# Patient Record
Sex: Female | Born: 1950
Health system: Southern US, Community
[De-identification: ages and names within clinical notes are randomized; demographics above are authoritative.]

## PROBLEM LIST (undated history)

## (undated) DIAGNOSIS — F329 Major depressive disorder, single episode, unspecified: Secondary | ICD-10-CM

## (undated) DIAGNOSIS — I959 Hypotension, unspecified: Secondary | ICD-10-CM

## (undated) DIAGNOSIS — R55 Syncope and collapse: Secondary | ICD-10-CM

## (undated) DIAGNOSIS — R51 Headache: Secondary | ICD-10-CM

## (undated) DIAGNOSIS — Z9889 Other specified postprocedural states: Secondary | ICD-10-CM

## (undated) DIAGNOSIS — R112 Nausea with vomiting, unspecified: Secondary | ICD-10-CM

## (undated) DIAGNOSIS — N183 Chronic kidney disease, stage 3 unspecified: Secondary | ICD-10-CM

## (undated) DIAGNOSIS — R519 Headache, unspecified: Secondary | ICD-10-CM

## (undated) DIAGNOSIS — F32A Depression, unspecified: Secondary | ICD-10-CM

## (undated) DIAGNOSIS — I4729 Other ventricular tachycardia: Secondary | ICD-10-CM

## (undated) DIAGNOSIS — I471 Supraventricular tachycardia, unspecified: Secondary | ICD-10-CM

## (undated) HISTORY — DX: Syncope and collapse: R55

## (undated) HISTORY — DX: Other ventricular tachycardia: I47.29

## (undated) HISTORY — PX: COLONOSCOPY: SHX174

## (undated) HISTORY — PX: TUBAL LIGATION: SHX77

## (undated) HISTORY — DX: Hypotension, unspecified: I95.9

## (undated) HISTORY — PX: REDUCTION MAMMAPLASTY: SUR839

## (undated) HISTORY — PX: TONSILLECTOMY: SUR1361

## (undated) HISTORY — PX: KNEE ARTHROSCOPY: SHX127

## (undated) HISTORY — PX: APPENDECTOMY: SHX54

## (undated) HISTORY — PX: KNEE ARTHROSCOPY W/ MENISCAL REPAIR: SHX1877

## (undated) HISTORY — DX: Supraventricular tachycardia, unspecified: I47.10

## (undated) HISTORY — DX: Chronic kidney disease, stage 3 unspecified: N18.30

## (undated) HISTORY — PX: BREAST EXCISIONAL BIOPSY: SUR124

---

## 1984-11-28 HISTORY — PX: LUMBAR LAMINECTOMY: SHX95

## 1998-08-12 ENCOUNTER — Other Ambulatory Visit: Admission: RE | Admit: 1998-08-12 | Discharge: 1998-08-12 | Payer: Self-pay | Admitting: Obstetrics and Gynecology

## 1998-08-28 ENCOUNTER — Ambulatory Visit (HOSPITAL_COMMUNITY): Admission: RE | Admit: 1998-08-28 | Discharge: 1998-08-28 | Payer: Self-pay | Admitting: Obstetrics and Gynecology

## 1999-09-02 ENCOUNTER — Other Ambulatory Visit: Admission: RE | Admit: 1999-09-02 | Discharge: 1999-09-02 | Payer: Self-pay | Admitting: Obstetrics and Gynecology

## 1999-10-11 ENCOUNTER — Encounter (INDEPENDENT_AMBULATORY_CARE_PROVIDER_SITE_OTHER): Payer: Self-pay | Admitting: Specialist

## 1999-10-11 ENCOUNTER — Other Ambulatory Visit: Admission: RE | Admit: 1999-10-11 | Discharge: 1999-10-11 | Payer: Self-pay | Admitting: Obstetrics and Gynecology

## 1999-10-20 ENCOUNTER — Ambulatory Visit (HOSPITAL_BASED_OUTPATIENT_CLINIC_OR_DEPARTMENT_OTHER): Admission: RE | Admit: 1999-10-20 | Discharge: 1999-10-20 | Payer: Self-pay | Admitting: *Deleted

## 1999-10-20 ENCOUNTER — Encounter (INDEPENDENT_AMBULATORY_CARE_PROVIDER_SITE_OTHER): Payer: Self-pay | Admitting: *Deleted

## 2000-01-01 ENCOUNTER — Encounter: Payer: Self-pay | Admitting: Emergency Medicine

## 2000-01-01 ENCOUNTER — Emergency Department (HOSPITAL_COMMUNITY): Admission: EM | Admit: 2000-01-01 | Discharge: 2000-01-01 | Payer: Self-pay | Admitting: Emergency Medicine

## 2000-10-06 ENCOUNTER — Other Ambulatory Visit: Admission: RE | Admit: 2000-10-06 | Discharge: 2000-10-06 | Payer: Self-pay | Admitting: Obstetrics and Gynecology

## 2001-01-25 ENCOUNTER — Encounter: Admission: RE | Admit: 2001-01-25 | Discharge: 2001-01-25 | Payer: Self-pay | Admitting: Occupational Medicine

## 2001-01-25 ENCOUNTER — Encounter: Payer: Self-pay | Admitting: Occupational Medicine

## 2001-10-10 ENCOUNTER — Other Ambulatory Visit: Admission: RE | Admit: 2001-10-10 | Discharge: 2001-10-10 | Payer: Self-pay | Admitting: Obstetrics and Gynecology

## 2002-05-10 ENCOUNTER — Encounter
Admission: RE | Admit: 2002-05-10 | Discharge: 2002-06-03 | Payer: Self-pay | Admitting: Physical Medicine & Rehabilitation

## 2002-08-28 ENCOUNTER — Encounter: Payer: Self-pay | Admitting: Orthopedic Surgery

## 2002-08-28 ENCOUNTER — Ambulatory Visit (HOSPITAL_COMMUNITY): Admission: RE | Admit: 2002-08-28 | Discharge: 2002-08-28 | Payer: Self-pay | Admitting: Orthopedic Surgery

## 2002-10-09 ENCOUNTER — Other Ambulatory Visit: Admission: RE | Admit: 2002-10-09 | Discharge: 2002-10-09 | Payer: Self-pay | Admitting: Obstetrics and Gynecology

## 2002-10-23 ENCOUNTER — Encounter: Admission: RE | Admit: 2002-10-23 | Discharge: 2002-10-23 | Payer: Self-pay | Admitting: Obstetrics and Gynecology

## 2002-10-23 ENCOUNTER — Encounter: Payer: Self-pay | Admitting: Obstetrics and Gynecology

## 2002-12-13 ENCOUNTER — Ambulatory Visit (HOSPITAL_COMMUNITY): Admission: RE | Admit: 2002-12-13 | Discharge: 2002-12-13 | Payer: Self-pay | Admitting: Gastroenterology

## 2003-10-16 ENCOUNTER — Encounter: Admission: RE | Admit: 2003-10-16 | Discharge: 2003-10-16 | Payer: Self-pay | Admitting: Obstetrics and Gynecology

## 2004-09-10 ENCOUNTER — Encounter: Payer: Self-pay | Admitting: General Practice

## 2004-10-18 ENCOUNTER — Encounter: Admission: RE | Admit: 2004-10-18 | Discharge: 2004-10-18 | Payer: Self-pay | Admitting: Obstetrics and Gynecology

## 2004-11-10 ENCOUNTER — Other Ambulatory Visit: Admission: RE | Admit: 2004-11-10 | Discharge: 2004-11-10 | Payer: Self-pay | Admitting: Obstetrics and Gynecology

## 2005-11-10 ENCOUNTER — Other Ambulatory Visit: Admission: RE | Admit: 2005-11-10 | Discharge: 2005-11-10 | Payer: Self-pay | Admitting: Obstetrics and Gynecology

## 2005-11-10 ENCOUNTER — Encounter: Admission: RE | Admit: 2005-11-10 | Discharge: 2005-11-10 | Payer: Self-pay | Admitting: Obstetrics and Gynecology

## 2006-11-14 ENCOUNTER — Encounter: Admission: RE | Admit: 2006-11-14 | Discharge: 2006-11-14 | Payer: Self-pay | Admitting: Obstetrics and Gynecology

## 2006-11-14 ENCOUNTER — Other Ambulatory Visit: Admission: RE | Admit: 2006-11-14 | Discharge: 2006-11-14 | Payer: Self-pay | Admitting: Obstetrics and Gynecology

## 2007-11-28 ENCOUNTER — Encounter: Admission: RE | Admit: 2007-11-28 | Discharge: 2007-11-28 | Payer: Self-pay | Admitting: Obstetrics and Gynecology

## 2007-12-05 ENCOUNTER — Other Ambulatory Visit: Admission: RE | Admit: 2007-12-05 | Discharge: 2007-12-05 | Payer: Self-pay | Admitting: Obstetrics and Gynecology

## 2008-07-11 ENCOUNTER — Encounter: Admission: RE | Admit: 2008-07-11 | Discharge: 2008-07-11 | Payer: Self-pay | Admitting: Obstetrics and Gynecology

## 2008-12-08 ENCOUNTER — Encounter: Admission: RE | Admit: 2008-12-08 | Discharge: 2008-12-08 | Payer: Self-pay | Admitting: Obstetrics and Gynecology

## 2008-12-08 ENCOUNTER — Other Ambulatory Visit: Admission: RE | Admit: 2008-12-08 | Discharge: 2008-12-08 | Payer: Self-pay | Admitting: Obstetrics and Gynecology

## 2009-12-09 ENCOUNTER — Encounter: Admission: RE | Admit: 2009-12-09 | Discharge: 2009-12-09 | Payer: Self-pay | Admitting: Obstetrics and Gynecology

## 2009-12-10 ENCOUNTER — Other Ambulatory Visit: Admission: RE | Admit: 2009-12-10 | Discharge: 2009-12-10 | Payer: Self-pay | Admitting: Obstetrics and Gynecology

## 2010-11-28 HISTORY — PX: ROTATOR CUFF REPAIR: SHX139

## 2010-12-14 ENCOUNTER — Other Ambulatory Visit: Payer: Self-pay | Admitting: Dermatology

## 2010-12-14 ENCOUNTER — Encounter
Admission: RE | Admit: 2010-12-14 | Discharge: 2010-12-14 | Payer: Self-pay | Source: Home / Self Care | Attending: Obstetrics and Gynecology | Admitting: Obstetrics and Gynecology

## 2010-12-14 ENCOUNTER — Other Ambulatory Visit
Admission: RE | Admit: 2010-12-14 | Discharge: 2010-12-14 | Payer: Self-pay | Source: Home / Self Care | Admitting: Obstetrics and Gynecology

## 2010-12-14 ENCOUNTER — Other Ambulatory Visit: Payer: Self-pay | Admitting: Obstetrics and Gynecology

## 2011-04-15 NOTE — Op Note (Signed)
   NAME:  Mary Freeman, Mary Freeman                          ACCOUNT NO.:  [id number]   MEDICAL RECORD NO.:  [id number]                   PATIENT TYPE:  AMB   LOCATION:  ENDO                                 FACILITY:  [facility]   PHYSICIAN:  Anselmo Rod, M.D.               DATE OF BIRTH:  09/15/1951   DATE OF PROCEDURE:  12/13/2002  DATE OF DISCHARGE:                                 OPERATIVE REPORT   PROCEDURE PERFORMED:  Screening colonoscopy.   ENDOSCOPIST:  Anselmo Rod, M.D.   INSTRUMENT USED:  Pediatric adjustable Olympus colonoscope.   INDICATIONS FOR PROCEDURE:  A 60 year old white female undergoing screening  colonoscopy.  She was found to have guaiac-positive stools on a recent exam.  Rule out colonic polyps, masses, etc.   PREPROCEDURE PREPARATION:  Informed consent was procured from the patient.  The patient had fasted for eight hours prior to the procedure and prepped  with a bottle of Gatorade and MiraLax the night prior to the procedure.   PREPROCEDURE PHYSICAL:  VITAL SIGNS:  The patient has stable vital signs.  NECK:  Supple.  CHEST:  Clear to auscultation, S1, S2 regular.  ABDOMEN:  Soft with normal bowel sounds.   DESCRIPTION OF PROCEDURE:  The patient was placed in the left lateral  decubitus position and sedated with 100 mg of Demerol and 10 mg of Versed  intravenously.  Once the patient was adequately sedated and maintained on  low-flow oxygen and continuous cardiac monitoring, the Olympus video  colonoscope was advanced from the rectum to the cecum and terminal ileum.  There was some residual stool in the colon.  Multiple washes were done.  No  masses, polyps, erosions, ulcerations, diverticula or hemorrhoids were seen.  The patient tolerated the procedure well without complications.   IMPRESSION:  Normal colonoscopy up to terminal ileum.   RECOMMENDATIONS:  1. Repeat guaiac testing will be done on an outpatient basis to document     guaiac-negative stools.   If stools continue to be guaiac positive,     further work-up will be undertaken as needed.   1.     Repeat colorectal cancer screening has been recommended in the next five     years unless the patient develops any abnormal symptoms in the interim.   1. Outpatient follow up in the next two weeks or earlier if needed.                                                 Anselmo Rod, M.D.    JNM/MEDQ  D:  12/13/2002  T:  12/13/2002  Job:  [id number]   cc:   Chales Salmon. Abigail Miyamoto, M.D.  [address]  [place]  [place] [address]  Fax: [phone]

## 2011-05-19 ENCOUNTER — Other Ambulatory Visit: Payer: Self-pay | Admitting: Gastroenterology

## 2011-05-19 ENCOUNTER — Ambulatory Visit (HOSPITAL_COMMUNITY)
Admission: RE | Admit: 2011-05-19 | Discharge: 2011-05-19 | Disposition: A | Discharge status: Home / Self Care | Payer: PRIVATE HEALTH INSURANCE | Source: Ambulatory Visit | Attending: Gastroenterology | Admitting: Gastroenterology

## 2011-05-19 DIAGNOSIS — R197 Diarrhea, unspecified: Secondary | ICD-10-CM | POA: Insufficient documentation

## 2011-06-09 NOTE — Op Note (Signed)
  NAMEKATERYN, Freeman NO.:  [id number]  MEDICAL RECORD NO.:  [id number]  LOCATION:  [facility]LEN                         FACILITY:  [facility]  PHYSICIAN:  Danise Edge, M.D.   DATE OF BIRTH:  [birth year]  DATE OF PROCEDURE:  05/19/2011 DATE OF DISCHARGE:                              OPERATIVE REPORT   REFERRING PHYSICIAN:  Dr. Sigmund Hazel.  HISTORY:  Mary Freeman is a 60 year old female born Nov 14, 1951. The patient has unexplained painless, nonbloody diarrhea.  The patient has had diarrhea since January 2012.  She will have up to 20 loose, nonbloody bowel movements daily, unassociated with abdominal pain, anorexia, fever, or weight loss.  She rarely has nocturnal diarrhea.  There is no family history of inflammatory bowel disease. There is no past history of liver disease or pancreatic disease.  The patient stopped taking her multivitamins which did not improve her diarrhea.  She stopped consuming very products and her diarrhea did not improve.  In 2009, the patient underwent a normal colonoscopy.  ENDOSCOPIST:  Danise Edge, M.D.  PREMEDICATION:  Propofol administered by Anesthesia.  PROCEDURE:  Anal inspection and digital rectal exam were normal.  The Pentax pediatric colonoscope was introduced into the rectum and advanced to the cecum.  A normal-appearing ileocecal valve and appendiceal orifice were identified.  I was unable to intubate the ileocecal valve and enter the ileum.  Colonic preparation for the exam today was good.  Rectum normal.  Retroflex view of the distal rectum normal.  Sigmoid colon and descending colon normal.  Splenic flexure normal.  Transverse colon normal.  Hepatic flexure normal.  Ascending colon normal.  Cecum and ileocecal valve normal.  BIOPSIES:  Random colon biopsies were taken from the right colon and left colon to look for microscopic colitis.  ASSESSMENT:  Normal proctocolonoscopy to the cecum.  Random  colon biopsies to screen for microscopic colitis pending.          ______________________________ Danise Edge, M.D.     MJ/MEDQ  D:  05/19/2011  T:  05/19/2011  Job:  [id number]  cc:   Sigmund Hazel, M.D. Fax: [phone]  Electronically Signed by Danise Edge M.D. on 06/09/2011 03:55:50 PM

## 2011-09-19 DIAGNOSIS — M67919 Unspecified disorder of synovium and tendon, unspecified shoulder: Secondary | ICD-10-CM

## 2011-09-19 DIAGNOSIS — M7700 Medial epicondylitis, unspecified elbow: Secondary | ICD-10-CM

## 2011-09-19 HISTORY — DX: Unspecified disorder of synovium and tendon, unspecified shoulder: M67.919

## 2011-09-19 HISTORY — DX: Medial epicondylitis, unspecified elbow: M77.00

## 2011-12-09 ENCOUNTER — Other Ambulatory Visit: Payer: Self-pay | Admitting: Obstetrics and Gynecology

## 2011-12-09 DIAGNOSIS — Z1231 Encounter for screening mammogram for malignant neoplasm of breast: Secondary | ICD-10-CM

## 2011-12-15 ENCOUNTER — Other Ambulatory Visit: Payer: Self-pay | Admitting: Dermatology

## 2011-12-15 ENCOUNTER — Other Ambulatory Visit (HOSPITAL_COMMUNITY)
Admission: RE | Admit: 2011-12-15 | Discharge: 2011-12-15 | Disposition: A | Discharge status: Home / Self Care | Payer: PRIVATE HEALTH INSURANCE | Source: Ambulatory Visit | Attending: Obstetrics and Gynecology | Admitting: Obstetrics and Gynecology

## 2011-12-15 ENCOUNTER — Other Ambulatory Visit: Payer: Self-pay | Admitting: Nurse Practitioner

## 2011-12-15 DIAGNOSIS — Z01419 Encounter for gynecological examination (general) (routine) without abnormal findings: Secondary | ICD-10-CM | POA: Insufficient documentation

## 2011-12-28 DIAGNOSIS — M7580 Other shoulder lesions, unspecified shoulder: Secondary | ICD-10-CM

## 2011-12-28 DIAGNOSIS — M75100 Unspecified rotator cuff tear or rupture of unspecified shoulder, not specified as traumatic: Secondary | ICD-10-CM

## 2011-12-28 HISTORY — DX: Other shoulder lesions, unspecified shoulder: M75.80

## 2011-12-28 HISTORY — DX: Unspecified rotator cuff tear or rupture of unspecified shoulder, not specified as traumatic: M75.100

## 2012-02-02 ENCOUNTER — Other Ambulatory Visit: Payer: Self-pay | Admitting: Family Medicine

## 2012-02-02 ENCOUNTER — Ambulatory Visit
Admission: RE | Admit: 2012-02-02 | Discharge: 2012-02-02 | Disposition: A | Discharge status: Home / Self Care | Payer: PRIVATE HEALTH INSURANCE | Source: Ambulatory Visit | Attending: Obstetrics and Gynecology | Admitting: Obstetrics and Gynecology

## 2012-02-02 DIAGNOSIS — Z1231 Encounter for screening mammogram for malignant neoplasm of breast: Secondary | ICD-10-CM

## 2012-03-09 ENCOUNTER — Other Ambulatory Visit: Payer: Self-pay | Admitting: Family Medicine

## 2012-04-16 DIAGNOSIS — Z9889 Other specified postprocedural states: Secondary | ICD-10-CM

## 2012-04-16 HISTORY — DX: Other specified postprocedural states: Z98.890

## 2012-06-11 DIAGNOSIS — M2240 Chondromalacia patellae, unspecified knee: Secondary | ICD-10-CM

## 2012-06-11 HISTORY — DX: Chondromalacia patellae, unspecified knee: M22.40

## 2012-12-06 ENCOUNTER — Other Ambulatory Visit: Payer: Self-pay | Admitting: Dermatology

## 2013-01-07 ENCOUNTER — Other Ambulatory Visit: Payer: Self-pay | Admitting: Family Medicine

## 2013-01-07 DIAGNOSIS — Z803 Family history of malignant neoplasm of breast: Secondary | ICD-10-CM

## 2013-01-07 DIAGNOSIS — Z1231 Encounter for screening mammogram for malignant neoplasm of breast: Secondary | ICD-10-CM

## 2013-02-04 ENCOUNTER — Ambulatory Visit: Payer: PRIVATE HEALTH INSURANCE

## 2013-02-20 ENCOUNTER — Ambulatory Visit
Admission: RE | Admit: 2013-02-20 | Discharge: 2013-02-20 | Disposition: A | Discharge status: Home / Self Care | Payer: PRIVATE HEALTH INSURANCE | Source: Ambulatory Visit | Attending: Family Medicine | Admitting: Family Medicine

## 2013-02-20 DIAGNOSIS — Z803 Family history of malignant neoplasm of breast: Secondary | ICD-10-CM

## 2013-02-20 DIAGNOSIS — Z1231 Encounter for screening mammogram for malignant neoplasm of breast: Secondary | ICD-10-CM

## 2014-02-03 ENCOUNTER — Other Ambulatory Visit: Payer: Self-pay

## 2014-02-03 DIAGNOSIS — Z803 Family history of malignant neoplasm of breast: Secondary | ICD-10-CM

## 2014-02-03 DIAGNOSIS — Z1231 Encounter for screening mammogram for malignant neoplasm of breast: Secondary | ICD-10-CM

## 2014-03-12 ENCOUNTER — Ambulatory Visit
Admission: RE | Admit: 2014-03-12 | Discharge: 2014-03-12 | Disposition: A | Discharge status: Home / Self Care | Payer: BC Managed Care – PPO | Source: Ambulatory Visit

## 2014-03-12 DIAGNOSIS — Z803 Family history of malignant neoplasm of breast: Secondary | ICD-10-CM

## 2014-03-12 DIAGNOSIS — Z1231 Encounter for screening mammogram for malignant neoplasm of breast: Secondary | ICD-10-CM

## 2014-03-19 ENCOUNTER — Other Ambulatory Visit: Payer: Self-pay | Admitting: Family Medicine

## 2014-03-19 DIAGNOSIS — R928 Other abnormal and inconclusive findings on diagnostic imaging of breast: Secondary | ICD-10-CM

## 2014-03-24 ENCOUNTER — Ambulatory Visit
Admission: RE | Admit: 2014-03-24 | Discharge: 2014-03-24 | Disposition: A | Discharge status: Home / Self Care | Payer: BC Managed Care – PPO | Source: Ambulatory Visit | Attending: Family Medicine | Admitting: Family Medicine

## 2014-03-24 ENCOUNTER — Other Ambulatory Visit: Payer: Self-pay | Admitting: Dermatology

## 2014-03-24 DIAGNOSIS — R928 Other abnormal and inconclusive findings on diagnostic imaging of breast: Secondary | ICD-10-CM

## 2014-03-26 ENCOUNTER — Ambulatory Visit: Payer: PRIVATE HEALTH INSURANCE

## 2014-03-27 ENCOUNTER — Other Ambulatory Visit: Payer: BC Managed Care – PPO

## 2014-08-18 ENCOUNTER — Other Ambulatory Visit: Payer: Self-pay | Admitting: Dermatology

## 2015-07-23 ENCOUNTER — Other Ambulatory Visit: Payer: Self-pay

## 2015-07-23 DIAGNOSIS — Z1231 Encounter for screening mammogram for malignant neoplasm of breast: Secondary | ICD-10-CM

## 2015-08-10 ENCOUNTER — Ambulatory Visit
Admission: RE | Admit: 2015-08-10 | Discharge: 2015-08-10 | Disposition: A | Discharge status: Home / Self Care | Payer: 59 | Source: Ambulatory Visit

## 2015-08-10 DIAGNOSIS — Z1231 Encounter for screening mammogram for malignant neoplasm of breast: Secondary | ICD-10-CM

## 2016-07-29 ENCOUNTER — Other Ambulatory Visit: Payer: Self-pay | Admitting: Family Medicine

## 2016-07-29 DIAGNOSIS — Z803 Family history of malignant neoplasm of breast: Secondary | ICD-10-CM

## 2016-07-29 DIAGNOSIS — Z1231 Encounter for screening mammogram for malignant neoplasm of breast: Secondary | ICD-10-CM

## 2016-12-19 DIAGNOSIS — M9903 Segmental and somatic dysfunction of lumbar region: Secondary | ICD-10-CM | POA: Diagnosis not present

## 2016-12-19 DIAGNOSIS — M9901 Segmental and somatic dysfunction of cervical region: Secondary | ICD-10-CM | POA: Diagnosis not present

## 2016-12-19 DIAGNOSIS — M9902 Segmental and somatic dysfunction of thoracic region: Secondary | ICD-10-CM | POA: Diagnosis not present

## 2016-12-19 DIAGNOSIS — M531 Cervicobrachial syndrome: Secondary | ICD-10-CM | POA: Diagnosis not present

## 2016-12-19 DIAGNOSIS — M9904 Segmental and somatic dysfunction of sacral region: Secondary | ICD-10-CM | POA: Diagnosis not present

## 2016-12-19 DIAGNOSIS — M6080 Other myositis, unspecified site: Secondary | ICD-10-CM | POA: Diagnosis not present

## 2017-01-03 DIAGNOSIS — G43009 Migraine without aura, not intractable, without status migrainosus: Secondary | ICD-10-CM | POA: Diagnosis not present

## 2017-01-03 DIAGNOSIS — N183 Chronic kidney disease, stage 3 (moderate): Secondary | ICD-10-CM | POA: Diagnosis not present

## 2017-01-03 DIAGNOSIS — F329 Major depressive disorder, single episode, unspecified: Secondary | ICD-10-CM | POA: Diagnosis not present

## 2017-01-03 DIAGNOSIS — G479 Sleep disorder, unspecified: Secondary | ICD-10-CM | POA: Diagnosis not present

## 2017-01-25 DIAGNOSIS — S46812A Strain of other muscles, fascia and tendons at shoulder and upper arm level, left arm, initial encounter: Secondary | ICD-10-CM | POA: Diagnosis not present

## 2017-01-31 DIAGNOSIS — S46812A Strain of other muscles, fascia and tendons at shoulder and upper arm level, left arm, initial encounter: Secondary | ICD-10-CM | POA: Diagnosis not present

## 2017-02-01 DIAGNOSIS — S97121A Crushing injury of right lesser toe(s), initial encounter: Secondary | ICD-10-CM | POA: Diagnosis not present

## 2017-02-01 DIAGNOSIS — Z23 Encounter for immunization: Secondary | ICD-10-CM | POA: Diagnosis not present

## 2017-02-06 DIAGNOSIS — S97121D Crushing injury of right lesser toe(s), subsequent encounter: Secondary | ICD-10-CM | POA: Diagnosis not present

## 2017-02-08 DIAGNOSIS — S97121D Crushing injury of right lesser toe(s), subsequent encounter: Secondary | ICD-10-CM | POA: Diagnosis not present

## 2017-02-13 DIAGNOSIS — S97121D Crushing injury of right lesser toe(s), subsequent encounter: Secondary | ICD-10-CM | POA: Diagnosis not present

## 2017-02-13 DIAGNOSIS — M7542 Impingement syndrome of left shoulder: Secondary | ICD-10-CM | POA: Diagnosis not present

## 2017-02-13 DIAGNOSIS — S46812D Strain of other muscles, fascia and tendons at shoulder and upper arm level, left arm, subsequent encounter: Secondary | ICD-10-CM | POA: Diagnosis not present

## 2017-02-15 ENCOUNTER — Encounter: Payer: Self-pay | Admitting: Rehabilitative and Restorative Service Providers"

## 2017-02-15 ENCOUNTER — Ambulatory Visit (INDEPENDENT_AMBULATORY_CARE_PROVIDER_SITE_OTHER): Payer: 59 | Admitting: Rehabilitative and Restorative Service Providers"

## 2017-02-15 DIAGNOSIS — M25512 Pain in left shoulder: Secondary | ICD-10-CM

## 2017-02-15 DIAGNOSIS — S91214D Laceration without foreign body of right lesser toe(s) with damage to nail, subsequent encounter: Secondary | ICD-10-CM | POA: Diagnosis not present

## 2017-02-15 DIAGNOSIS — R29898 Other symptoms and signs involving the musculoskeletal system: Secondary | ICD-10-CM | POA: Diagnosis not present

## 2017-02-15 DIAGNOSIS — S99921D Unspecified injury of right foot, subsequent encounter: Secondary | ICD-10-CM | POA: Diagnosis not present

## 2017-02-15 NOTE — Patient Instructions (Addendum)
Self massage using about a 4 inch plastic ball  Shoulder Blade Squeeze   Can use swim noodle along back  Rotate shoulders back, then squeeze shoulder blades down and back. Hold 10 sec Repeat __10__ times. Do __several __ sessions per day.   Axial Extension (Chin Tuck)    Pull chin in and lengthen back of neck. Hold _10-15___ seconds while counting out loud. Repeat _5-10___ times. Do _several___ sessions per day.  Pulley  Arms straight up  - 10 sec hold x 10 reps  Arms out like a jumping jack  -  10 sec hold x 10 reps   SUPINE Tips A    Being in the supine position means to be lying on the back. Lying on the back is the position of least compression on the bones and discs of the spine, and helps to re-align the natural curves of the back.gradually bring arms up to the side toward 120 degrees. Lying on back for 3-5 minutes - can bend elbows to release stretch a few seconds as needed then straighten arms out again.     TENS UNIT: This is helpful for muscle pain and spasm.   Search and Purchase a TENS 7000 2nd edition at www.tenspros.com. It should be less than $30.     TENS unit instructions: Do not shower or bathe with the unit on Turn the unit off before removing electrodes or batteries If the electrodes lose stickiness add a drop of water to the electrodes after they are disconnected from the unit and place on plastic sheet. If you continued to have difficulty, call the TENS unit company to purchase more electrodes. Do not apply lotion on the skin area prior to use. Make sure the skin is clean and dry as this will help prolong the life of the electrodes. After use, always check skin for unusual red areas, rash or other skin difficulties. If there are any skin problems, does not apply electrodes to the same area. Never remove the electrodes from the unit by pulling the wires. Do not use the TENS unit or electrodes other than as directed. Do not change electrode placement  without consultating your therapist or physician. Keep 2 fingers with between each electrode.

## 2017-02-15 NOTE — Therapy (Addendum)
[name], [place], [address] Phone: [phone]   Fax:  [phone]  Physical Therapy Evaluation  Patient Details  Name: Mary Freeman MRN: [id number] Date of Birth: November 11, 1951 Referring Provider: Dr Justice Britain  Encounter Date: 02/15/2017      PT End of Session - 02/15/17 1424    Visit Number 1   Number of Visits 12   Date for PT Re-Evaluation 03/29/17   PT Start Time 1430   PT Stop Time 1529   PT Time Calculation (min) 59 min   Activity Tolerance Patient tolerated treatment well      History reviewed. No pertinent past medical history.  History reviewed. No pertinent surgical history.  There were no vitals filed for this visit.       Subjective Assessment - 02/15/17 1440    Subjective Mary Freeman reports that she injured Lt shoudler 10/25/16 while working outside she fell onto [employer] after slipping in wet leaves. She felt pain. She was seen by a chiropractor for 3 visits which helped the neck symptoms but not the neck. She was seen by ortho and received injection in the Lt shoudler 02/13/17. She now has pain in the Lt elbow and continues to have discomfort in the Lt shoudler.    Pertinent History Rt RCR 2013; # Rt knee srthroscopic surgeries and 1 Lt knee; lumbar laminectomy 1986   How long can you sit comfortably? no limit   How long can you stand comfortably? no limit   How long can you walk comfortably? no limit   Diagnostic tests MRI - showed fraying on the Lt RC but no tear; bone spur    Patient Stated Goals get rid of the pain and avoid surgery    Currently in Pain? Yes   Pain Score 3    Pain Location Shoulder   Pain Orientation Left   Pain Descriptors / Indicators Dull;Aching   Pain Type Acute pain   Pain Radiating Towards radiating into the Lt elbow - at ties into the Lt UE varying in the location    Pain Onset More than a month ago   Pain Frequency Intermittent   Aggravating  Factors  lying on the Rt side; lifting; carrying; work activities    Pain Relieving Factors heat; meds             [facility] PT Assessment - 02/15/17 0001      Assessment   Medical Diagnosis Impingement Lt shoudler; Lt shoudler strain   Referring Provider Dr Justice Britain   Onset Date/Surgical Date 10/25/16   Hand Dominance Right   Next MD Visit 03/16/17   Prior Therapy chiropractic care 3 visits      Precautions   Precautions None     Balance Screen   Has the patient fallen in the past 6 months Yes   How many times? 1   Has the patient had a decrease in activity level because of a fear of falling?  No   Is the patient reluctant to leave their home because of a fear of falling?  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    Leisure household chores; yard work; walking in the  neighborhood - 4 days/wk 45 min (not walking due to toe injury Lt foot)      Observation/Other Assessments   Focus on Therapeutic Outcomes (FOTO)  57% limitation      Sensation   Additional Comments WFL's per pt report - occassional numb tingling into the hand if lying on sofa      Posture/Postural Control   Posture Comments head forward; shoudlers rounded and elevated; increased thoracic kyphosis; scapulae abducted and rotated along the thoracic spine      AROM   Right Shoulder Extension 59 Degrees   Right Shoulder Flexion 153 Degrees   Right Shoulder ABduction 157 Degrees   Right Shoulder Internal Rotation 38 Degrees   Right Shoulder External Rotation 90 Degrees   Left Shoulder Extension 37 Degrees   Left Shoulder Flexion 77 Degrees   Left Shoulder ABduction 73 Degrees   Left Shoulder Internal Rotation 27 Degrees   Left Shoulder External Rotation 65 Degrees   Cervical  Flexion 46   Cervical Extension 43   Cervical - Right Side Bend 30   Cervical - Left Side Bend 26   Cervical - Right Rotation 64   Cervical - Left Rotation 60     Strength   Strength Assessment Site --   Right/Left Shoulder --  Lt shoudler - WNL's    Right Shoulder Flexion 3-/5  pain    Right Shoulder Extension 4+/5   Right Shoulder ABduction 3-/5  pain    Right Shoulder Internal Rotation 4/5  pain    Right Shoulder External Rotation 4/5     Palpation   Palpation comment tenderness and tightness through pecs; anterior shoudler; upper traps; leveator Rt shoulder girdle                    [facility] Adult PT Treatment/Exercise - 02/15/17 0001      Therapeutic Activites    Therapeutic Activities --  myofacail ball release work      Neuro Re-ed    Neuro Re-ed Details  working on posture and alignment engaging posterior shoudler girdle     Shoulder Exercises: Supine   Other Supine Exercises sustained snow angle stretch 2-3 min arms ~ 70 deg abduction resting on table     Shoulder Exercises: Standing   Other Standing Exercises scap squeeze with noodle 10 sec x 10; axial extension 5-10 sec x 5      Shoulder Exercises: Pulleys   Flexion --  10 sec hold x 10    ABduction --  10 sec hold x 10    ABduction Limitations scaption      Moist Heat Therapy   Number Minutes Moist Heat 20 Minutes   Moist Heat Location Shoulder;Elbow  Lt     Electrical Stimulation   Electrical Stimulation Location Lt shoudler    Electrical Stimulation Action IFC   Electrical Stimulation Parameters to tolerance   Electrical Stimulation Goals Pain;Tone                PT Education - 02/15/17 1510    Education provided Yes   Education Details HEP;  postural correction; ball release work; Games developer) Educated Patient   Methods Explanation;Demonstration;Tactile cues;Verbal cues;Handout   Comprehension Verbalized understanding;Returned demonstration;Verbal cues  required;Tactile cues required             PT Long Term Goals - 02/15/17 1600      PT LONG TERM GOAL #1   Title Increase AROM Lt shoudler to  equal or greater than AROM Lt shoudler 03/29/17   Time 6   Period Weeks   Status New     PT LONG TERM GOAL #2   Title 4+/5 to 5/5 strength Lt shoudler 03/29/17   Time 6   Period Weeks   Status New     PT LONG TERM GOAL #3   Title Decrease pain to no more than 1-2/10 for all functional and work tasks 03/29/17   Time 6   Period Weeks     PT LONG TERM GOAL #4   Title Independent in HEP 03/29/17   Time 6   Period Weeks   Status New     PT LONG TERM GOAL #5   Title Improve FOTO to </= 38% limitation 03/29/17   Time 6   Period Weeks   Status New               Plan - 02/15/17 1659    Clinical Impression Statement Panzy presents for minimally complex evaluation of Lt shoudler pain and dysfunction following injury to Lt shoudler due to a fall 11/17. She has diagnosis of impingement and strain and received an injection in Lt shoulder 02/13/17 with minimal improvement to date. Pt has poor posture and aignment; poor engagement of posterior shoudler girdle musculature/upper core; decreased ROM Lt shoulder and cervical spine; decreased strength Lt UE; pain and tenderness to palpation through Lt upper quarter with tightness noted through the pecs, upper traps, leveator, teres, biceps, deltoid musculature. Functional activities are limited and patient has pain on a daily basis. She will benefit from PT to address problems identifiied.    Rehab Potential Good   PT Frequency 2x / week   PT Duration 6 weeks   PT Treatment/Interventions Patient/family education;ADLs/Self Care Home Management;Cryotherapy;Electrical Stimulation;Iontophoresis 4mg /ml Dexamethasone;Moist Heat;Ultrasound;Dry needling;Manual techniques;Therapeutic activities;Therapeutic exercise;Neuromuscular re-education   PT Next Visit Plan manual work through [employer] girdle; PROM;  stretching; posterior shoudler girdle strengthening as tolerated; modalities as indicated    Consulted and Agree with Plan of Care Patient      Patient will benefit from skilled therapeutic intervention in order to improve the following deficits and impairments:  Postural dysfunction, Improper body mechanics, Pain, Decreased range of motion, Decreased mobility, Decreased strength, Impaired UE functional use, Decreased activity tolerance  Visit Diagnosis: Acute pain of left shoulder - Plan: PT plan of care cert/re-cert  Other symptoms and signs involving the musculoskeletal system - Plan: PT plan of care cert/re-cert     Problem List There are no active problems to display for this patient.    Nilda Simmer PT, MPH  02/15/2017, 5:14 PM  [facility] [place], [place], [address] Phone: [phone]   Fax:  [phone]  Name: JACQUITA MULHEARN MRN: [id number] Date of Birth: Dec 19, 1950

## 2017-02-17 ENCOUNTER — Encounter: Payer: Self-pay | Admitting: Rehabilitative and Restorative Service Providers"

## 2017-02-17 ENCOUNTER — Ambulatory Visit (INDEPENDENT_AMBULATORY_CARE_PROVIDER_SITE_OTHER): Payer: 59 | Admitting: Rehabilitative and Restorative Service Providers"

## 2017-02-17 DIAGNOSIS — R29898 Other symptoms and signs involving the musculoskeletal system: Secondary | ICD-10-CM

## 2017-02-17 DIAGNOSIS — M25512 Pain in left shoulder: Secondary | ICD-10-CM | POA: Diagnosis not present

## 2017-02-17 NOTE — Therapy (Signed)
[place], [place], [address] Phone: [phone]   Fax:  [phone]  Physical Therapy Treatment  Patient Details  Name: Mary Freeman MRN: [id number] Date of Birth: [birth year] Referring Provider: Dr Justice Britain  Encounter Date: 02/17/2017      PT End of Session - 02/17/17 1144    Visit Number 2   Number of Visits 12   Date for PT Re-Evaluation 03/29/17   PT Start Time 7341   PT Stop Time 9379   PT Time Calculation (min) 58 min   Activity Tolerance Patient tolerated treatment well      History reviewed. No pertinent past medical history.  History reviewed. No pertinent surgical history.  There were no vitals filed for this visit.      Subjective Assessment - 02/17/17 1144    Subjective Mary Freeman reports that her shoulder is feeling some better. She has been doing her exercises at home. She had some pain last night but the pain is improved. Still can't lie on the Lt side - which is the biggest thing that bothers. Pleased with her porgress.    Currently in Pain? Yes   Pain Score 1    Pain Location Shoulder   Pain Orientation Left   Pain Descriptors / Indicators Nagging;Aching   Pain Type Acute pain   Pain Onset More than a month ago   Pain Frequency Intermittent                         [facility] Adult PT Treatment/Exercise - 02/17/17 0001      Therapeutic Activites    Therapeutic Activities --  myofacail ball release work      Neuro Re-ed    Neuro Re-ed Details  working on posture and alignment engaging posterior shoudler girdle     Shoulder Exercises: Supine   Other Supine Exercises sustained snow angle stretch 2-3 min arms ~ 90 deg abduction resting on table     Shoulder Exercises: Standing   Extension Strengthening;Both;20 reps;Theraband   Theraband Level (Shoulder Extension) Level 1 (Yellow)   Row Strengthening;Both;20 reps;Theraband   Theraband Level (Shoulder  Row) Level 1 (Yellow)   Retraction AROM;Both;10 reps  pain with ful range - limited range/no resistance    Other Standing Exercises scap squeeze with noodle 10 sec x 10; axial extension 5-10 sec x 5      Shoulder Exercises: Pulleys   Flexion --  10 sec hold x 10    ABduction --  10 sec hold x 10    ABduction Limitations scaption      Shoulder Exercises: Stretch   Other Shoulder Stretches 3 way doorway stretch 30 sec x 2 each      Moist Heat Therapy   Number Minutes Moist Heat 20 Minutes   Moist Heat Location Shoulder;Elbow  Lt     Electrical Stimulation   Electrical Stimulation Location Lt shoudler    Electrical Stimulation Action IFC   Electrical Stimulation Parameters to tolerance   Electrical Stimulation Goals Pain;Tone     Manual Therapy   Manual therapy comments pt supine    Joint Mobilization Lt GH joint circumduction/ inferior glides    Soft tissue mobilization Lt shoudler girdle focus on pecs/traps/teres/biceps    Myofascial Release anterior Lt shoudler    Scapular Mobilization Lt   Passive ROM Lt shoulder  PT Education - 02/17/17 1207    Education provided Yes   Education Details HEP    Person(s) Educated Patient   Methods Explanation;Demonstration;Tactile cues;Verbal cues;Handout   Comprehension Verbalized understanding;Returned demonstration;Verbal cues required;Tactile cues required             PT Long Term Goals - 02/15/17 1600      PT LONG TERM GOAL #1   Title Increase AROM Lt shoudler to equal or greater than AROM Lt shoudler 03/29/17   Time 6   Period Weeks   Status New     PT LONG TERM GOAL #2   Title 4+/5 to 5/5 strength Lt shoudler 03/29/17   Time 6   Period Weeks   Status New     PT LONG TERM GOAL #3   Title Decrease pain to no more than 1-2/10 for all functional and work tasks 03/29/17   Time 6   Period Weeks     PT LONG TERM GOAL #4   Title Independent in HEP 03/29/17   Time 6   Period Weeks   Status New      PT LONG TERM GOAL #5   Title Improve FOTO to </= 38% limitation 03/29/17   Time 6   Period Weeks   Status New               Plan - 02/17/17 1244    Clinical Impression Statement Excellent response to initial treatment and HEP. Patient reports good improvement with pain. She has improved thoracic extension and engagement of posterior shoulder girdle musculature as well as incresed Lt shoudler ROM with pulley and PROM. Progressing well toward stated goals of therapy.    Rehab Potential Good   PT Frequency 2x / week   PT Duration 6 weeks   PT Treatment/Interventions Patient/family education;ADLs/Self Care Home Management;Cryotherapy;Electrical Stimulation;Iontophoresis 4mg /ml Dexamethasone;Moist Heat;Ultrasound;Dry needling;Manual techniques;Therapeutic activities;Therapeutic exercise;Neuromuscular re-education   PT Next Visit Plan manual work through [employer] girdle; PROM; stretching; progress posterior shoudler girdle strengthening as tolerated; modalities as indicated - - add biceps stretch   Consulted and Agree with Plan of Care Patient      Patient will benefit from skilled therapeutic intervention in order to improve the following deficits and impairments:  Postural dysfunction, Improper body mechanics, Pain, Decreased range of motion, Decreased mobility, Decreased strength, Impaired UE functional use, Decreased activity tolerance  Visit Diagnosis: Acute pain of left shoulder  Other symptoms and signs involving the musculoskeletal system     Problem List There are no active problems to display for this patient.   [place], MPH  02/17/2017, 12:47 PM  [facility] [place], [place], [address] Phone: [phone]   Fax:  [phone]  Name: Mary Freeman MRN: [id number] Date of Birth: 1951-11-15

## 2017-02-17 NOTE — Patient Instructions (Addendum)
  Side Bend, Sitting      Stand or sit, head in comfortable, centered position, chin slightly tucked. Gently tilt head, bringing ear toward same-side shoulder. Hold _10-15__ seconds.  Repeat _2-3__ times per session. Do _as needed during the day.  Scapula Adduction With Pectoralis Stretch: Low - Standing   Shoulders at 45 hands even with shoulders, keeping weight through legs, shift weight forward until you feel pull or stretch through the front of your chest. Hold _30__ seconds. Do _3__ times, _2-4__ times per day.   Scapula Adduction With Pectoralis Stretch: Mid-Range - Standing   Shoulders at 90 elbows even with shoulders, keeping weight through legs, shift weight forward until you feel pull or strength through the front of your chest. Hold __30_ seconds. Do _3__ times, __2-4_ times per day.   Scapula Adduction With Pectoralis Stretch: High - Standing   Shoulders at 120 hands up high on the doorway, keeping weight on feet, shift weight forward until you feel pull or stretch through the front of your chest. Hold _30__ seconds. Do _3__ times, _2-3__ times per day.   Resisted External Rotation: in Neutral - Bilateral (no band for now)   PALMS UP Sit or stand, tubing in both hands, elbows at sides, bent to 90, forearms forward. Pinch shoulder blades together and rotate forearms out. Keep elbows at sides. Repeat __10__ times per set. Do _2-3___ sets per session. Do _2-3___ sessions per day.   Low Row: Standing   Face anchor, feet shoulder width apart. Palms up, pull arms back, squeezing shoulder blades together. Repeat 10__ times per set. Do 2-3__ sets per session. Do 2-3__ sessions per week. Anchor Height: Waist   Strengthening: Resisted Extension   Hold tubing in right hand, arm forward. Pull arm back, elbow straight. Repeat _10___ times per set. Do 2-3____ sets per session. Do 2-3____ sessions per day.

## 2017-02-20 ENCOUNTER — Ambulatory Visit (INDEPENDENT_AMBULATORY_CARE_PROVIDER_SITE_OTHER): Payer: 59 | Admitting: Rehabilitative and Restorative Service Providers"

## 2017-02-20 ENCOUNTER — Encounter: Payer: Self-pay | Admitting: Rehabilitative and Restorative Service Providers"

## 2017-02-20 DIAGNOSIS — M25512 Pain in left shoulder: Secondary | ICD-10-CM | POA: Diagnosis not present

## 2017-02-20 DIAGNOSIS — R29898 Other symptoms and signs involving the musculoskeletal system: Secondary | ICD-10-CM | POA: Diagnosis not present

## 2017-02-20 NOTE — Patient Instructions (Addendum)
ELBOW: Biceps - Standing    Standing in doorway OR hold onto the back of furniture or on the counter, elbow straight. Step forward with left foot and keeping weight through the legs - not the arm. Hold _20-30__ seconds. _3__ reps per set, __3-4 per day  Wrist Extensors    Elbow straight, palm down light fist. Place other hand with thumb on underside of wrist and fingers on back of hand. Slowly bend wrist down until stretch is felt on top of forearm. Hold _20-30__ seconds. Repeat _3__ times per session. Do _several__ sessions per day.   Neurovascular: Median Nerve Stretch - Supine    Lie with neck supported, side-bent away from moving arm. Hold right arm out to side, elbow bent, thumb down, fingers and wrist bent back. Slowly straighten elbowas far as possible without pain. Hold for __60__ seconds. Repeat __2__ times  2 times/day/   Massage across the tight area just below the elbow - over the sore area  1-2 minutes 1-2 times/day Can follow with an ice pack - or try rubbing an ice cube across that area for 3-4 minutes or placing a cold water bottle on the area for a few minutes

## 2017-02-20 NOTE — Therapy (Signed)
[place], [place], [address] Phone: [phone]   Fax:  [phone]  Physical Therapy Treatment  Patient Details  Name: Mary Freeman MRN: [id number] Date of Birth: [birth year] Referring Provider: Dr Justice Britain  Encounter Date: 02/20/2017      PT End of Session - 02/20/17 1054    Visit Number 3   Number of Visits 12   Date for PT Re-Evaluation 03/29/17   PT Start Time 1054   PT Stop Time 1149   PT Time Calculation (min) 55 min   Activity Tolerance Patient tolerated treatment well      History reviewed. No pertinent past medical history.  History reviewed. No pertinent surgical history.  There were no vitals filed for this visit.      Subjective Assessment - 02/20/17 1055    Subjective Lt Shoulder continues to improve. Less pain and increased movement. She can "almost sleep on it for a little bit". Lt elbow is hurting again now. Not sure what she may have done to irritate the elbow.    Currently in Pain? No/denies            [facility] PT Assessment - 02/20/17 0001      Assessment   Medical Diagnosis Impingement Lt shoudler; Lt shoudler strain   Referring Provider Dr Justice Britain   Onset Date/Surgical Date 10/25/16   Hand Dominance Right   Next MD Visit 03/16/17   Prior Therapy chiropractic care 3 visits      AROM   Left Shoulder Extension 50 Degrees   Left Shoulder Flexion 151 Degrees   Left Shoulder ABduction 158 Degrees   Left Shoulder Internal Rotation 31 Degrees   Left Shoulder External Rotation 80 Degrees     Palpation   Palpation comment improving tenderness and tightness through pecs; anterior shoudler; upper traps; leveator Rt shoulder girdle                      [facility] Adult PT Treatment/Exercise - 02/20/17 0001      Neuro Re-ed    Neuro Re-ed Details  working on posture and alignment engaging posterior shoudler girdle     Shoulder Exercises: Supine   Other Supine Exercises neural stretch 60 sec x 2 Lt UE; x1 Rt UE      Shoulder Exercises: Standing   Other Standing Exercises scap squeeze with noodle 10 sec x 10; axial extension 5-10 sec x 5      Shoulder Exercises: Pulleys   Flexion --  10 sec hold x 10    ABduction --  10 sec hold x 10    ABduction Limitations scaption      Shoulder Exercises: Stretch   Other Shoulder Stretches 3 way doorway stretch 30 sec x 2 each    Other Shoulder Stretches biceps stretch at table 20 sec x 3 Lt; extensor forearm stretch 20 sec x 3 Lt      Moist Heat Therapy   Number Minutes Moist Heat 20 Minutes   Moist Heat Location Shoulder;Elbow  Lt     Electrical Stimulation   Electrical Stimulation Location Lt shoudler    Electrical Stimulation Action IFC   Electrical Stimulation Parameters to tolerance   Electrical Stimulation Goals Pain;Tone     Iontophoresis   Type of Iontophoresis Dexamethasone   Location Lt lateral epicondyle    Dose 120 mAmp   Time 8-10 hours      Manual Therapy   Manual  therapy comments pt supine    Joint Mobilization Lt GH joint circumduction/ inferior glides; CPA mobs cervical spine Grade II/III   Soft tissue mobilization Lt shoudler girdle focus on pecs/traps/teres/biceps; Lt extensor forearm into the lateral epicondyle area     Myofascial Release anterior Lt shoudler    Scapular Mobilization Lt   Passive ROM Lt shoulder                 PT Education - 02/20/17 1156    Education provided Yes   Education Details HEP    Person(s) Educated Patient   Methods Explanation;Demonstration;Tactile cues;Verbal cues;Handout   Comprehension Verbalized understanding;Returned demonstration;Verbal cues required;Tactile cues required             PT Long Term Goals - 02/20/17 1148      PT LONG TERM GOAL #1   Title Increase AROM Lt shoudler to equal or greater than AROM Lt shoudler 03/29/17   Time 6   Period Weeks   Status On-going     PT LONG TERM GOAL #2    Title 4+/5 to 5/5 strength Lt shoudler 03/29/17   Time 6   Period Weeks   Status On-going     PT LONG TERM GOAL #3   Title Decrease pain to no more than 1-2/10 for all functional and work tasks 03/29/17   Time 6   Period Weeks   Status On-going     PT LONG TERM GOAL #4   Title Independent in HEP 03/29/17   Time 6   Period Weeks   Status On-going     PT LONG TERM GOAL #5   Title Improve FOTO to </= 38% limitation 03/29/17   Time 6   Period Weeks   Status On-going               Plan - 02/20/17 1145    Clinical Impression Statement Continues to improve with significant gains in AROM Lt shoulder; decreased pain and improved functional activities. Pt has continued Lt elbow pain. Address Lt UE myofacial restrictions with manual work including work through the cervical spine and into the Lt elbow and forearm. Noted good response with decreased palpable tightness and report of decreased pain or discomfort. Excellent progress toward stated goals of therapy.    Rehab Potential Good   PT Frequency 2x / week   PT Duration 6 weeks   PT Treatment/Interventions Patient/family education;ADLs/Self Care Home Management;Cryotherapy;Electrical Stimulation;Iontophoresis 4mg /ml Dexamethasone;Moist Heat;Ultrasound;Dry needling;Manual techniques;Therapeutic activities;Therapeutic exercise;Neuromuscular re-education   PT Next Visit Plan manual work through [employer] girdle; PROM; stretching; progress posterior shoudler girdle strengthening as tolerated; modalities as indicated    Consulted and Agree with Plan of Care Patient      Patient will benefit from skilled therapeutic intervention in order to improve the following deficits and impairments:  Postural dysfunction, Improper body mechanics, Pain, Decreased range of motion, Decreased mobility, Decreased strength, Impaired UE functional use, Decreased activity tolerance  Visit Diagnosis: Acute pain of left shoulder  Other symptoms and signs  involving the musculoskeletal system     Problem List There are no active problems to display for this patient.   [place], MPH  02/20/2017, 12:05 PM  [facility] [place], [place], [address] Phone: [phone]   Fax:  [phone]  Name: Mary Freeman MRN: [id number] Date of Birth: 05/15/1951

## 2017-02-27 ENCOUNTER — Encounter: Payer: Self-pay | Admitting: Rehabilitative and Restorative Service Providers"

## 2017-02-27 ENCOUNTER — Ambulatory Visit (INDEPENDENT_AMBULATORY_CARE_PROVIDER_SITE_OTHER): Payer: 59 | Admitting: Rehabilitative and Restorative Service Providers"

## 2017-02-27 DIAGNOSIS — R29898 Other symptoms and signs involving the musculoskeletal system: Secondary | ICD-10-CM

## 2017-02-27 DIAGNOSIS — M25512 Pain in left shoulder: Secondary | ICD-10-CM

## 2017-02-27 NOTE — Patient Instructions (Addendum)
Reverse wall push up  2-3 sec hold x 10 2 sets   Scapular Retraction: Elbow Flexion (Standing)    With elbows bent to 90, pinch shoulder blades together and rotate arms out, keeping elbows bent. Repeat _10___ times per set. Do __1-2__ sets per session. Do ___several_ sessions per day.    Strengthening: Isometric Abduction    Using wall for resistance, press left arm into ball using light pressure. Hold __10__ seconds. Repeat __5__ times per set. Do ___2_ sets per session. Do __1__ sessions per day.  Strengthening: Isometric External Rotation    Using wall to provide resistance, and keeping right arm at side, press back of hand into ball using light pressure. Hold __10 seconds. Repeat __5__ times per set. Do __2__ sets per session. Do __1__ sessions per day.   Trigger Point Dry Needling  . What is Trigger Point Dry Needling (DN)? o DN is a physical therapy technique used to treat muscle pain and dysfunction. Specifically, DN helps deactivate muscle trigger points (muscle knots).  o A thin filiform needle is used to penetrate the skin and stimulate the underlying trigger point. The goal is for a local twitch response (LTR) to occur and for the trigger point to relax. No medication of any kind is injected during the procedure.   . What Does Trigger Point Dry Needling Feel Like?  o The procedure feels different for each individual patient. Some patients report that they do not actually feel the needle enter the skin and overall the process is not painful. Very mild bleeding may occur. However, many patients feel a deep cramping in the muscle in which the needle was inserted. This is the local twitch response.   Marland Kitchen How Will I feel after the treatment? o Soreness is normal, and the onset of soreness may not occur for a few hours. Typically this soreness does not last longer than two days.  o Bruising is uncommon, however; ice can be used to decrease any possible bruising.  o In rare  cases feeling tired or nauseous after the treatment is normal. In addition, your symptoms may get worse before they get better, this period will typically not last longer than 24 hours.   . What Can I do After My Treatment? o Increase your hydration by drinking more water for the next 24 hours. o You may place ice or heat on the areas treated that have become sore, however, do not use heat on inflamed or bruised areas. Heat often brings more relief post needling. o You can continue your regular activities, but vigorous activity is not recommended initially after the treatment for 24 hours. o DN is best combined with other physical therapy such as strengthening, stretching, and other therapies.

## 2017-02-27 NOTE — Therapy (Signed)
[place], [place], [address] Phone: [phone]   Fax:  [phone]  Physical Therapy Treatment  Patient Details  Name: Mary Freeman MRN: [id number] Date of Birth: 1951/09/18 Referring Provider: Dr Justice Britain   Encounter Date: 02/27/2017      PT End of Session - 02/27/17 1149    Visit Number 4   Number of Visits 12   Date for PT Re-Evaluation 03/29/17   PT Start Time 6073   PT Stop Time 7106   PT Time Calculation (min) 58 min   Activity Tolerance Patient tolerated treatment well      History reviewed. No pertinent past medical history.  History reviewed. No pertinent surgical history.  There were no vitals filed for this visit.      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 Elbow is feeling better. It still hurts some.    Currently in Pain? Yes   Pain Score 2    Pain Location Elbow   Pain Orientation Left   Pain Descriptors / Indicators Sore;Aching   Pain Type Acute pain   Pain Onset More than a month ago   Pain Frequency Intermittent   Aggravating Factors  lifting; carrying; work activities; lying on Lt side > 15 min   Pain Relieving Factors heat; meds             [facility] PT Assessment - 02/27/17 0001      Assessment   Medical Diagnosis Impingement Lt shoudler; Lt shoudler strain   Referring Provider Dr Justice Britain    Onset Date/Surgical Date 10/25/16   Hand Dominance Right   Next MD Visit 03/16/17   Prior Therapy chiropractic care 3 visits      AROM   Left Shoulder Extension 50 Degrees   Left Shoulder Flexion 151 Degrees   Left Shoulder ABduction 155 Degrees   Left Shoulder Internal Rotation 32 Degrees   Left Shoulder External Rotation 88 Degrees                     [facility] Adult PT Treatment/Exercise - 02/27/17 0001      Shoulder Exercises: Standing    Extension Strengthening;Both;20 reps;Theraband   Theraband Level (Shoulder Extension) Level 2 (Red)   Row Strengthening;Both;20 reps;Theraband   Theraband Level (Shoulder Row) Level 2 (Red)   Retraction AROM;Both;10 reps   Theraband Level (Shoulder Retraction) Level 1 (Yellow)   Other Standing Exercises scap squeeze with noodle 10 sec x 10; axial extension 5-10 sec x 5    Other Standing Exercises reverse wall push up 10 x 2 sets      Shoulder Exercises: Pulleys   Flexion --  10 sec hold x 10    ABduction --  10 sec hold x 10    ABduction Limitations scaption      Shoulder Exercises: Isometric Strengthening   External Rotation 5X10"  2 sets    ABduction 5X10"  2 sets      Shoulder Exercises: Stretch   Other Shoulder Stretches 3 way doorway stretch 30 sec x 2 each; biceps stretch 20 sec x 3; extensor forearm strength 20 sec x 3    Other Shoulder Stretches biceps stretch at table 20 sec x 3 Lt; extensor forearm stretch 20 sec x 3 Lt      Moist Heat Therapy  Number Minutes Moist Heat 20 Minutes   Moist Heat Location Shoulder;Elbow  Lt     Nurse, learning disability Action IFC   Electrical Stimulation Parameters to tolerance   Electrical Stimulation Goals Pain;Tone     Manual Therapy   Manual therapy comments pt supine    Joint Mobilization Lt GH joint circumduction/ inferior glides; CPA mobs cervical spine Grade II/III   Soft tissue mobilization Lt shoudler girdle focus on pecs/traps/teres/biceps; Lt extensor forearm into the lateral epicondyle area     Myofascial Release anterior Lt shoudler    Scapular Mobilization Lt   Passive ROM Lt shoulder           Trigger Point Dry Needling - 02/27/17 1234    Consent Given? Yes   Education Handout Provided Yes   Muscles Treated Upper Body --  Lt extensor forearm distal to lat epicondyle-dec tightness               PT Education - 02/27/17 1210     Education provided Yes   Education Details HEP; DN   Person(s) Educated Patient   Methods Explanation;Demonstration;Tactile cues;Verbal cues;Handout   Comprehension Verbalized understanding;Returned demonstration;Verbal cues required;Tactile cues required             PT Long Term Goals - 02/27/17 1235      PT LONG TERM GOAL #1   Title Increase AROM Lt shoudler to equal or greater than AROM Lt shoudler 03/29/17   Time 6   Period Weeks   Status On-going     PT LONG TERM GOAL #2   Title 4+/5 to 5/5 strength Lt shoudler 03/29/17   Time 6   Period Weeks   Status On-going     PT LONG TERM GOAL #3   Title Decrease pain to no more than 1-2/10 for all functional and work tasks 03/29/17   Time 6   Period Weeks   Status Achieved     PT LONG TERM GOAL #4   Title Independent in HEP 03/29/17   Time 6   Period Weeks   Status On-going     PT LONG TERM GOAL #5   Title Improve FOTO to </= 38% limitation 03/29/17   Time 6   Period Weeks   Status On-going               Plan - 02/27/17 1216    Clinical Impression Statement 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 Shoulder is doing well. Added exercise without difficulty and progressed to red TB without increase in pain. Gains noted on ER. Trial of DN to Lt lateral epicondyle. Progressing well toward stated goals of therapy.    Rehab Potential Good   PT Frequency 2x / week   PT Duration 6 weeks   PT Treatment/Interventions Patient/family education;ADLs/Self Care Home Management;Cryotherapy;Electrical Stimulation;Iontophoresis 4mg /ml Dexamethasone;Moist Heat;Ultrasound;Dry needling;Manual techniques;Therapeutic activities;Therapeutic exercise;Neuromuscular re-education   PT Next Visit Plan manual work through [employer] girdle; PROM; stretching; progress posterior shoudler girdle strengthening as tolerated; modalities as indicated; assess response to DN Lt lateral epicondyle    Consulted and  Agree with Plan of Care Patient      Patient will benefit from skilled therapeutic intervention in order to improve the following deficits and impairments:  Postural dysfunction, Improper body mechanics, Pain, Decreased range of motion, Decreased mobility, Decreased strength, Impaired UE functional use, Decreased activity tolerance  Visit  Diagnosis: Acute pain of left shoulder  Other symptoms and signs involving the musculoskeletal system     Problem List There are no active problems to display for this patient.   [place], MPH  02/27/2017, 12:38 PM  [facility] [place], [place], [address] Phone: [phone]   Fax:  [phone]  Name: Mary Freeman MRN: [id number] Date of Birth: 1951-04-02

## 2017-03-02 ENCOUNTER — Ambulatory Visit (INDEPENDENT_AMBULATORY_CARE_PROVIDER_SITE_OTHER): Payer: 59 | Admitting: Rehabilitative and Restorative Service Providers"

## 2017-03-02 ENCOUNTER — Encounter: Payer: Self-pay | Admitting: Rehabilitative and Restorative Service Providers"

## 2017-03-02 DIAGNOSIS — R29898 Other symptoms and signs involving the musculoskeletal system: Secondary | ICD-10-CM

## 2017-03-02 DIAGNOSIS — M25512 Pain in left shoulder: Secondary | ICD-10-CM

## 2017-03-02 NOTE — Patient Instructions (Signed)
Ball on Wall: Circle - Standing   (Can use a towel on wall) Roll ball in circles. Do this actively with both hands on ball. Roll __1 min , _1__ times per day.   Scapular Retraction: Rowing (Eccentric) - Arms - 45 Degrees (Resistance Band)    Hold end of band in each hand. Pull back until elbows are even with trunk. Keep elbows out from sides at 45, thumbs up. Slowly release for 3-5 seconds. Use ____red____ resistance band. 10___ reps per set, _2-3__ sets per day, _1 time/day

## 2017-03-02 NOTE — Therapy (Signed)
[place], [place], [address] Phone: [phone]   Fax:  [phone]  Physical Therapy Treatment  Patient Details  Name: Mary Freeman MRN: [id number] Date of Birth: Dec 12, 1950 Referring Provider: Dr Justice Britain   Encounter Date: 03/02/2017      PT End of Session - 03/02/17 1022    Visit Number 5   Number of Visits 12   Date for PT Re-Evaluation 03/29/17   PT Start Time 1017   PT Stop Time 1112   PT Time Calculation (min) 55 min   Activity Tolerance Patient tolerated treatment well      History reviewed. No pertinent past medical history.  History reviewed. No pertinent surgical history.  There were no vitals filed for this visit.      Subjective Assessment - 03/02/17 1022    Subjective Lt shoudler is improving. Cotninues to have difficulty holding IV bag up to spike the bag. Lt elbow was a little sore but has felt better. No problems with the elbow in the past couple of days. 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    Pain Score 2    Pain Location Shoulder  no elbow pain    Pain Orientation Left   Pain Descriptors / Indicators Sore;Aching                         [facility] Adult PT Treatment/Exercise - 03/02/17 0001      Shoulder Exercises: Standing   Extension Strengthening;Both;20 reps;Theraband   Theraband Level (Shoulder Extension) Level 2 (Red)   Row Strengthening;Both;20 reps;Theraband   Theraband Level (Shoulder Row) Level 2 (Red)   Row Limitations added row at 45 deg red TB x 20 reps    Retraction AROM;Both;10 reps   Theraband Level (Shoulder Retraction) --  pain with TB - completed w/out resistance   Other Standing Exercises scap squeeze with noodle 10 sec x 10; axial extension 5-10 sec x 5      Shoulder Exercises: Pulleys   Flexion --  10 sec hold x 10    ABduction --  10 sec hold x 10    ABduction Limitations  scaption      Shoulder Exercises: Therapy Ball   Flexion --  rolling ball on wall overhead 1 min x 3    Other Therapy Ball Exercises stretch into flexioin stepping under large orange ball 30 sec x 3    Other Therapy Ball Exercises bouncing ball on wall overhead 1 min x3      Shoulder Exercises: ROM/Strengthening   UBE (Upper Arm Bike) L2 x 4 min alt fwd/back every minute      Shoulder Exercises: Stretch   Other Shoulder Stretches 3 way doorway stretch 30 sec x 2 each; biceps stretch 20 sec x 3; extensor forearm strength 20 sec x 3    Other Shoulder Stretches biceps stretch at table 20 sec x 3 Lt; extensor forearm stretch 20 sec x 3 Lt      Moist Heat Therapy   Number Minutes Moist Heat 20 Minutes   Moist Heat Location Shoulder;Elbow  Lt     Electrical Stimulation   Electrical Stimulation Location Lt shoulder    Electrical Stimulation Action IFC   Electrical Stimulation Parameters to tolerance   Electrical Stimulation Goals Pain;Tone     Manual Therapy   Manual therapy comments pt supine  Joint Mobilization Lt [place] joint circumduction/ inferior glides; CPA mobs cervical spine Grade II/III   Soft tissue mobilization Lt shoudler girdle focus on pecs/traps/teres/biceps; Lt extensor forearm into the lateral epicondyle area     Myofascial Release anterior Lt shoudler    Scapular Mobilization Lt   Passive ROM Lt shoulder                 PT Education - 03/02/17 1046    Education provided Yes   Education Details HEP   Person(s) Educated Patient   Methods Explanation;Demonstration;Tactile cues;Verbal cues;Handout   Comprehension Verbalized understanding;Returned demonstration;Verbal cues required;Tactile cues required             PT Long Term Goals - 02/27/17 1235      PT LONG TERM GOAL #1   Title Increase AROM Lt shoudler to equal or greater than AROM Lt shoudler 03/29/17   Time 6   Period Weeks   Status On-going     PT LONG TERM GOAL #2   Title 4+/5 to 5/5  strength Lt shoudler 03/29/17   Time 6   Period Weeks   Status On-going     PT LONG TERM GOAL #3   Title Decrease pain to no more than 1-2/10 for all functional and work tasks 03/29/17   Time 6   Period Weeks   Status Achieved     PT LONG TERM GOAL #4   Title Independent in HEP 03/29/17   Time 6   Period Weeks   Status On-going     PT LONG TERM GOAL #5   Title Improve FOTO to </= 38% limitation 03/29/17   Time 6   Period Weeks   Status On-going               Plan - 03/02/17 1055    Clinical Impression Statement Continued progress. Decreased pain in Lt shoudler and elbow. Noted improved tissue extensibility Lt extensor forearm; continued tightness through the Lt anterior shoulder into the teres. Added exercise without difficulty.  Progressing well toward goals of therapy.    Rehab Potential Good   PT Frequency 2x / week   PT Duration 6 weeks   PT Treatment/Interventions Patient/family education;ADLs/Self Care Home Management;Cryotherapy;Electrical Stimulation;Iontophoresis 4mg /ml Dexamethasone;Moist Heat;Ultrasound;Dry needling;Manual techniques;Therapeutic activities;Therapeutic exercise;Neuromuscular re-education   PT Next Visit Plan manual work through [employer] girdle; PROM; stretching; progress posterior shoudler girdle strengthening as tolerated; modalities as indicated; consider DN Lt anterior shoulder - biceps/deltoid/pecs and teres.   Consulted and Agree with Plan of Care Patient      Patient will benefit from skilled therapeutic intervention in order to improve the following deficits and impairments:  Postural dysfunction, Improper body mechanics, Pain, Decreased range of motion, Decreased mobility, Decreased strength, Impaired UE functional use, Decreased activity tolerance  Visit Diagnosis: Acute pain of left shoulder  Other symptoms and signs involving the musculoskeletal system     Problem List There are no active problems to display for this  patient.   [place], MPH  03/02/2017, 5:57 PM  [facility] [place], [place], [address] Phone: [phone]   Fax:  [phone]  Name: PRISCILLIA FOUCH MRN: [id number] Date of Birth: December 07, 1950

## 2017-03-06 ENCOUNTER — Encounter: Payer: Self-pay | Admitting: Rehabilitative and Restorative Service Providers"

## 2017-03-06 ENCOUNTER — Ambulatory Visit (INDEPENDENT_AMBULATORY_CARE_PROVIDER_SITE_OTHER): Payer: 59 | Admitting: Rehabilitative and Restorative Service Providers"

## 2017-03-06 DIAGNOSIS — M6080 Other myositis, unspecified site: Secondary | ICD-10-CM | POA: Diagnosis not present

## 2017-03-06 DIAGNOSIS — M9903 Segmental and somatic dysfunction of lumbar region: Secondary | ICD-10-CM | POA: Diagnosis not present

## 2017-03-06 DIAGNOSIS — R29898 Other symptoms and signs involving the musculoskeletal system: Secondary | ICD-10-CM | POA: Diagnosis not present

## 2017-03-06 DIAGNOSIS — M531 Cervicobrachial syndrome: Secondary | ICD-10-CM | POA: Diagnosis not present

## 2017-03-06 DIAGNOSIS — M25512 Pain in left shoulder: Secondary | ICD-10-CM

## 2017-03-06 DIAGNOSIS — M9902 Segmental and somatic dysfunction of thoracic region: Secondary | ICD-10-CM | POA: Diagnosis not present

## 2017-03-06 DIAGNOSIS — M9904 Segmental and somatic dysfunction of sacral region: Secondary | ICD-10-CM | POA: Diagnosis not present

## 2017-03-06 DIAGNOSIS — M9901 Segmental and somatic dysfunction of cervical region: Secondary | ICD-10-CM | POA: Diagnosis not present

## 2017-03-06 NOTE — Therapy (Signed)
[place], [place], [address] Phone: [phone]   Fax:  [phone]  Physical Therapy Treatment  Patient Details  Name: Mary Freeman MRN: [id number] Date of Birth: 04-23-1951 Referring Provider: Dr Justice Britain   Encounter Date: 03/06/2017      PT End of Session - 03/06/17 1201    Visit Number 6   Number of Visits 12   Date for PT Re-Evaluation 03/29/17   PT Start Time 2563   PT Stop Time 1240   PT Time Calculation (min) 55 min   Activity Tolerance Patient tolerated treatment well      History reviewed. No pertinent past medical history.  History reviewed. No pertinent surgical history.  There were no vitals filed for this visit.      Subjective Assessment - 03/06/17 1202    Subjective Lt shoulder and forearm are improving. She has continued with her exercises and is having very little pain. Continues to have some soreness after work.    Currently in Pain? No/denies                         [facility] Adult PT Treatment/Exercise - 03/06/17 0001      Shoulder Exercises: Supine   Horizontal ABduction Strengthening;Left;Theraband;20 reps  diagonal    Theraband Level (Shoulder Horizontal ABduction) Level 1 (Yellow)   Other Supine Exercises prolonged snow angel 2-3 min on swim noodle    Other Supine Exercises trunk rotation supine 20-30 sec x 3      Shoulder Exercises: Standing   External Rotation Strengthening;Left;20 reps;Theraband   Theraband Level (Shoulder External Rotation) Level 3 (Green)   Extension Strengthening;Both;20 reps;Theraband   Theraband Level (Shoulder Extension) Level 3 (Green)   Row Strengthening;Both;20 reps;Theraband   Theraband Level (Shoulder Row) Level 3 (Green)   Row Limitations added row at 45 deg red TB x 20 reps    Retraction AROM;Both;10 reps   Theraband Level (Shoulder Retraction) Level 1 (Yellow)  with noodle    Other Standing Exercises  scap squeeze with noodle 10 sec x 10; axial extension 5-10 sec x 5      Shoulder Exercises: Pulleys   Flexion --  10 sec hold x 10    ABduction --  10 sec hold x 10    ABduction Limitations scaption      Shoulder Exercises: Therapy Ball   Flexion --  rolling ball on wall overhead 1 min x 3    Other Therapy Ball Exercises stretch into flexioin stepping under large orange ball 30 sec x 3    Other Therapy Ball Exercises bouncing ball on wall overhead 1 min x3      Shoulder Exercises: ROM/Strengthening   UBE (Upper Arm Bike) L3 x 4 min alt fwd/back every minute      Shoulder Exercises: Stretch   Other Shoulder Stretches 3 way doorway stretch 30 sec x 2 each; biceps stretch 20 sec x 3; extensor forearm strength 20 sec x 3    Other Shoulder Stretches biceps stretch at table 20 sec x 3 Lt; extensor forearm stretch 20 sec x 3 Lt      Moist Heat Therapy   Number Minutes Moist Heat 20 Minutes   Moist Heat Location Shoulder;Elbow  Lt     Electrical Stimulation   Electrical Stimulation Location Lt shoulder    Electrical Stimulation Action IFC   Electrical Stimulation Parameters to tolerance   Electrical Stimulation Goals Pain;Tone  Manual Therapy   Manual therapy comments pt supine    Joint Mobilization Lt GH joint circumduction/ inferior glides; CPA mobs cervical spine Grade II/III   Soft tissue mobilization Lt shoudler girdle focus on pecs/traps/teres/biceps; Lt extensor forearm into the lateral epicondyle area     Myofascial Release anterior Lt shoudler - Lt trunk    Scapular Mobilization Lt   Passive ROM Lt shoulder                 PT Education - 03/06/17 1210    Education provided Yes   Education Details HEP    Person(s) Educated Patient   Methods Explanation;Demonstration;Tactile cues;Verbal cues;Handout   Comprehension Verbalized understanding;Returned demonstration;Verbal cues required;Tactile cues required             PT Long Term Goals - 03/06/17 1210       PT LONG TERM GOAL #1   Title Increase AROM Lt shoudler to equal or greater than AROM Lt shoudler 03/29/17   Time 6   Period Weeks   Status On-going     PT LONG TERM GOAL #2   Title 4+/5 to 5/5 strength Lt shoudler 03/29/17   Time 6   Period Weeks   Status On-going     PT LONG TERM GOAL #3   Title Decrease pain to no more than 1-2/10 for all functional and work tasks 03/29/17   Time 6   Period Weeks   Status Achieved     PT LONG TERM GOAL #4   Title Independent in HEP 03/29/17   Time 6   Period Weeks   Status On-going     PT LONG TERM GOAL #5   Title Improve FOTO to </= 38% limitation 03/29/17   Time 6   Period Weeks   Status On-going               Plan - 03/06/17 1213    Clinical Impression Statement Progressing well with rehab - little pain, improved function, tolerating exercise well. Returns to MD 03/13/17. Will re-assess for progress note at next MD visit.    Rehab Potential Good   PT Frequency 2x / week   PT Duration 6 weeks   PT Treatment/Interventions Patient/family education;ADLs/Self Care Home Management;Cryotherapy;Electrical Stimulation;Iontophoresis 4mg /ml Dexamethasone;Moist Heat;Ultrasound;Dry needling;Manual techniques;Therapeutic activities;Therapeutic exercise;Neuromuscular re-education   PT Next Visit Plan manual work through [employer] girdle; PROM; stretching; progress posterior shoudler girdle strengthening as tolerated; modalities as indicated; consider DN Lt anterior shoulder - biceps/deltoid/pecs and teres. Reassess for MD note    Consulted and Agree with Plan of Care Patient      Patient will benefit from skilled therapeutic intervention in order to improve the following deficits and impairments:  Postural dysfunction, Improper body mechanics, Pain, Decreased range of motion, Decreased mobility, Decreased strength, Impaired UE functional use, Decreased activity tolerance  Visit Diagnosis: Acute pain of left shoulder  Other symptoms and  signs involving the musculoskeletal system     Problem List There are no active problems to display for this patient.   [place], MPH  03/06/2017, 12:42 PM  [facility] [place], [place], [address] Phone: [phone]   Fax:  [phone]  Name: Mary Freeman MRN: [id number] Date of Birth: 1951-08-07

## 2017-03-06 NOTE — Patient Instructions (Addendum)
Strengthening: Resisted External Rotation    Hold tubing in right hand, elbow at side and forearm across body. Rotate forearm out. Repeat __10__ times per set. Do __2-3__ sets per session. Do __1__ sessions per day. Red or green TB   ELBOW: Wall Push-Up    With arms slightly wider than shoulders, gently lean body to wall. Push body away from wall by straightening elbows. __10_ reps per set, _1__ sets per day, _2-3__ days per week     Strengthening: Resisted Abduction    Hold tubing with right arm across body. Pull up and away from side. Move through pain-free range of motion. Repeat _10___ times per set. Do __1-2__ sets per session. Do __1__ sessions per day.

## 2017-03-09 ENCOUNTER — Encounter: Payer: Self-pay | Admitting: Rehabilitative and Restorative Service Providers"

## 2017-03-09 ENCOUNTER — Ambulatory Visit (INDEPENDENT_AMBULATORY_CARE_PROVIDER_SITE_OTHER): Payer: 59 | Admitting: Rehabilitative and Restorative Service Providers"

## 2017-03-09 DIAGNOSIS — M25512 Pain in left shoulder: Secondary | ICD-10-CM | POA: Diagnosis not present

## 2017-03-09 DIAGNOSIS — R29898 Other symptoms and signs involving the musculoskeletal system: Secondary | ICD-10-CM

## 2017-03-09 NOTE — Therapy (Addendum)
[place], [place], [address] Phone: [phone]   Fax:  [phone]  Physical Therapy Treatment  Patient Details  Name: Mary Freeman MRN: [id number] Date of Birth: 31-Aug-1951 Referring Provider: Dr Justice Britain   Encounter Date: 03/09/2017      PT End of Session - 03/09/17 1149    Visit Number 7   Number of Visits 12   Date for PT Re-Evaluation 03/29/17   PT Start Time 5809   PT Stop Time 9833   PT Time Calculation (min) 57 min   Activity Tolerance Patient tolerated treatment well      History reviewed. No pertinent past medical history.  History reviewed. No pertinent surgical history.  There were no vitals filed for this visit.      Subjective Assessment - 03/09/17 1246    Subjective Flare up of Lt shoulder and elbow pain last night and this moring. She feels the flare up is related to work. She has started spiking IV bags while they are hanging instead of putting them under her arm. This and the increase in work activity with the Lt UE increases her symptoms.    Currently in Pain? Yes   Pain Score 4    Pain Location Shoulder   Pain Orientation Left   Pain Descriptors / Indicators Sore;Aching   Pain Type Acute pain   Pain Radiating Towards intermittent pain into the Lt elbow - related to activities    Pain Onset More than a month ago   Pain Frequency Intermittent   Aggravating Factors  work activities; lifting; reaching    Pain Relieving Factors heat; meds            [facility] PT Assessment - 03/09/17 0001      Assessment   Medical Diagnosis Impingement Lt shoudler; Lt shoudler strain   Referring Provider Dr Justice Britain    Onset Date/Surgical Date 10/25/16   Hand Dominance Right   Next MD Visit 03/16/17   Prior Therapy chiropractic care 3 visits      AROM   Left Shoulder Extension 50 Degrees   Left Shoulder Flexion 155 Degrees   Left Shoulder ABduction 158 Degrees   Left  Shoulder Internal Rotation 37 Degrees   Left Shoulder External Rotation 90 Degrees   Cervical Flexion 56   Cervical Extension 45   Cervical - Right Side Bend 38   Cervical - Left Side Bend 32   Cervical - Right Rotation 64   Cervical - Left Rotation 62     Strength   Right Shoulder Flexion 4+/5   Right Shoulder Extension --  5-/5   Right Shoulder ABduction 4/5   Right Shoulder Internal Rotation --  5-/5   Right Shoulder External Rotation 4+/5     Palpation   Palpation comment improving tenderness and tightness through pecs; anterior shoudler; upper traps; leveator Rt shoulder girdle                      [facility] Adult PT Treatment/Exercise - 03/09/17 0001      Shoulder Exercises: Standing   Extension Strengthening;Both;20 reps;Theraband   Theraband Level (Shoulder Extension) Level 3 (Green)   Row Strengthening;Both;20 reps;Theraband   Theraband Level (Shoulder Row) Level 3 (Green)   Row Limitations added row at 45 deg red TB x 20 reps    Retraction AROM;Both;10 reps   Theraband Level (Shoulder Retraction) Level 1 (Yellow)  with noodle    Other  Standing Exercises scap squeeze with noodle 10 sec x 10; axial extension 5-10 sec x 5      Shoulder Exercises: Pulleys   Flexion --  10 sec hold x 10    ABduction --  10 sec hold x 10    ABduction Limitations scaption      Shoulder Exercises: ROM/Strengthening   UBE (Upper Arm Bike) L3 x 4 min alt fwd/back every minute      Shoulder Exercises: Stretch   Other Shoulder Stretches 3 way doorway stretch 30 sec x 2 each; biceps stretch 20 sec x 3; extensor forearm strength 20 sec x 3    Other Shoulder Stretches biceps stretch at table 20 sec x 3 Lt; extensor forearm stretch 20 sec x 3 Lt      Moist Heat Therapy   Number Minutes Moist Heat 20 Minutes   Moist Heat Location Shoulder;Elbow  Lt     Electrical Stimulation   Electrical Stimulation Location Lt shoulder    Electrical Stimulation Action IFC   Electrical  Stimulation Parameters to tolerance   Electrical Stimulation Goals Pain;Tone     Manual Therapy   Manual therapy comments pt supine    Joint Mobilization Lt GH joint circumduction/ inferior glides; CPA mobs cervical spine Grade II/III   Soft tissue mobilization Lt shoudler girdle focus on pecs/traps/teres/biceps; Lt extensor forearm into the lateral epicondyle area     Myofascial Release anterior Lt shoudler - Lt trunk    Scapular Mobilization Lt   Passive ROM Lt shoulder           Trigger Point Dry Needling - 03/09/17 1245    Consent Given? Yes   Muscles Treated Upper Body --  Lt - biceps - decreased tightness to palpation   Pectoralis Major Response Palpable increased muscle length   Pectoralis Minor Response Palpable increased muscle length                   PT Long Term Goals - 03/09/17 1243      PT LONG TERM GOAL #1   Title Increase AROM Lt shoudler to equal or greater than AROM Lt shoudler 03/29/17   Time 6   Period Weeks   Status Partially Met     PT LONG TERM GOAL #2   Title 4+/5 to 5/5 strength Lt shoudler 03/29/17   Time 6   Period Weeks   Status Partially Met     PT LONG TERM GOAL #3   Title Decrease pain to no more than 1-2/10 for all functional and work tasks 03/29/17   Time 6   Period Weeks   Status Partially Met     PT LONG TERM GOAL #4   Title Independent in HEP 03/29/17   Time 6   Period Weeks   Status On-going     PT LONG TERM GOAL #5   Title Improve FOTO to </= 38% limitation 03/29/17   Time 6   Period Weeks   Status On-going             Patient will benefit from skilled therapeutic intervention in order to improve the following deficits and impairments:     Visit Diagnosis: Acute pain of left shoulder  Other symptoms and signs involving the musculoskeletal system     Problem List There are no active problems to display for this patient.    Nilda Simmer PT, MPH  03/09/2017, 12:53 PM  [place] [address]  [place], [place], [address] Phone: [phone]   Fax:  [phone]  Name: Mary Freeman MRN: [id number] Date of Birth: 1951-01-02  PHYSICAL THERAPY DISCHARGE SUMMARY  Visits from Start of Care: 7  Current functional level related to goals / functional outcomes: Excellent progress. See last treatment note for status.   Remaining deficits: Continued intermittent pain and flare up of symptoms related to activities at work.   Education / Equipment: HEP Plan: Patient agrees to discharge.  Patient goals were partially met. Patient is being discharged due to being pleased with the current functional level.  ?????    P. Helene Kelp PT, MPH 03/17/17 9:10 AM

## 2017-03-13 DIAGNOSIS — M7542 Impingement syndrome of left shoulder: Secondary | ICD-10-CM | POA: Diagnosis not present

## 2017-03-13 DIAGNOSIS — S46812D Strain of other muscles, fascia and tendons at shoulder and upper arm level, left arm, subsequent encounter: Secondary | ICD-10-CM | POA: Diagnosis not present

## 2017-03-14 ENCOUNTER — Encounter: Payer: 59 | Admitting: Rehabilitative and Restorative Service Providers"

## 2017-03-15 ENCOUNTER — Encounter: Payer: 59 | Admitting: Rehabilitative and Restorative Service Providers"

## 2017-04-04 DIAGNOSIS — M9904 Segmental and somatic dysfunction of sacral region: Secondary | ICD-10-CM | POA: Diagnosis not present

## 2017-04-04 DIAGNOSIS — M531 Cervicobrachial syndrome: Secondary | ICD-10-CM | POA: Diagnosis not present

## 2017-04-04 DIAGNOSIS — M6080 Other myositis, unspecified site: Secondary | ICD-10-CM | POA: Diagnosis not present

## 2017-04-04 DIAGNOSIS — M9901 Segmental and somatic dysfunction of cervical region: Secondary | ICD-10-CM | POA: Diagnosis not present

## 2017-04-04 DIAGNOSIS — M9902 Segmental and somatic dysfunction of thoracic region: Secondary | ICD-10-CM | POA: Diagnosis not present

## 2017-04-04 DIAGNOSIS — M9903 Segmental and somatic dysfunction of lumbar region: Secondary | ICD-10-CM | POA: Diagnosis not present

## 2017-04-17 ENCOUNTER — Emergency Department (INDEPENDENT_AMBULATORY_CARE_PROVIDER_SITE_OTHER)
Admission: EM | Admit: 2017-04-17 | Discharge: 2017-04-17 | Disposition: A | Discharge status: Home / Self Care | Payer: 59 | Source: Home / Self Care | Attending: Family Medicine | Admitting: Family Medicine

## 2017-04-17 ENCOUNTER — Encounter: Payer: Self-pay | Admitting: *Deleted

## 2017-04-17 DIAGNOSIS — W5501XA Bitten by cat, initial encounter: Secondary | ICD-10-CM | POA: Diagnosis not present

## 2017-04-17 DIAGNOSIS — S81851A Open bite, right lower leg, initial encounter: Secondary | ICD-10-CM | POA: Diagnosis not present

## 2017-04-17 DIAGNOSIS — L089 Local infection of the skin and subcutaneous tissue, unspecified: Secondary | ICD-10-CM | POA: Diagnosis not present

## 2017-04-17 MED ORDER — AMOXICILLIN-POT CLAVULANATE 875-125 MG PO TABS: 1.0000 | ORAL_TABLET | Freq: Two times a day (BID) | ORAL | 0 refills | Status: DC

## 2017-04-17 NOTE — Discharge Instructions (Signed)
May continue Naprosyn.  Elevate leg.  Wear support hose if leg swelling occurs.  May apply ice pack for about two days to minimize swelling. If symptoms become significantly worse during the night or over the weekend, proceed to the local emergency room.

## 2017-04-17 NOTE — ED Provider Notes (Signed)
[name]    CSN: [id number] Arrival date & time: 04/17/17  1026     History   Chief Complaint Chief Complaint  Patient presents with  . Animal Bite    HPI Mary Freeman is a 66 y.o. female.   Patient's cat bit her right lower leg last night, and the area remains painful.  Her cat is immunized against rabies, and patient's Tdap is current (March 2018)   The history is provided by the patient.  Animal Bite  Contact animal:  Cat Location:  Leg Leg injury location:  R lower leg Time since incident:  12 hours Pain details:    Quality:  Aching   Severity:  Mild   Timing:  Constant   Progression:  Worsening Incident location:  Home Provoked: provoked   Notifications:  Law enforcement Animal's rabies vaccination status:  Up to date Animal in possession: yes   Tetanus status:  Up to date Relieved by:  None tried Worsened by:  Activity Ineffective treatments:  None tried Associated symptoms: no fever, no numbness, no rash and no swelling     History reviewed. No pertinent past medical history.  There are no active problems to display for this patient.   History reviewed. No pertinent surgical history.  OB History    No data available       Home Medications    Prior to Admission medications   Medication Sig Start Date End Date Taking? Authorizing Provider  tiZANidine (ZANAFLEX) 2 MG tablet Take by mouth every 6 (six) hours as needed for muscle spasms.   Yes [provider]  acetaminophen-codeine (TYLENOL #3) 300-30 MG tablet Take by mouth every 4 (four) hours as needed for moderate pain.    [provider]  amoxicillin-clavulanate (AUGMENTIN) 875-125 MG tablet Take 1 tablet by mouth 2 (two) times daily. Take with food 04/17/17   Kandra Nicolas, MD  FLUoxetine (PROZAC) 20 MG tablet Take 20 mg by mouth daily.    [provider]  naproxen (NAPROSYN) 500 MG tablet Take 500 mg by mouth 2 (two) times daily with a meal.     [provider]  rizatriptan (MAXALT) 10 MG tablet Take 10 mg by mouth as needed for migraine. May repeat in 2 hours if needed    [provider]    Family History Family History  Problem Relation Age of Onset  . Cancer Father        lung  . Cancer Sister        breast    Social History Social History  Substance Use Topics  . Smoking status: Never Smoker  . Smokeless tobacco: Never Used  . Alcohol use Yes     Allergies   Dilaudid [hydromorphone]; Morphine and related; Stadol [butorphanol]; Sulfa antibiotics; and Talwin [pentazocine]   Review of Systems Review of Systems  Constitutional: Negative for fever.  Skin: Negative for rash.  Neurological: Negative for numbness.  All other systems reviewed and are negative.    Physical Exam Triage Vital Signs ED Triage Vitals  Enc Vitals Group     BP 04/17/17 1100 100/66     Pulse Rate 04/17/17 1100 69     Resp 04/17/17 1100 16     Temp 04/17/17 1100 97.5 F (36.4 C)     Temp Source 04/17/17 1100 Oral     SpO2 04/17/17 1100 98 %     Weight 04/17/17 1100 138 lb (62.6 kg)     Height 04/17/17  1100 5\' 8"  (1.727 m)     Head Circumference --      Peak Flow --      Pain Score 04/17/17 1101 5     Pain Loc --      Pain Edu? --      Excl. in [place]? --    No data found.   Updated Vital Signs BP 100/66 (BP Location: Left Arm)   Pulse 69   Temp 97.5 F (36.4 C) (Oral)   Resp 16   Ht 5\' 8"  (1.727 m)   Wt 138 lb (62.6 kg)   SpO2 98%   BMI 20.98 kg/m   Visual Acuity Right Eye Distance:   Left Eye Distance:   Bilateral Distance:    Right Eye Near:   Left Eye Near:    Bilateral Near:     Physical Exam  Constitutional: She appears well-developed and well-nourished. No distress.  HENT:  Head: Atraumatic.  Eyes: Pupils are equal, round, and reactive to light.  Cardiovascular: Normal rate.   Pulmonary/Chest: Effort normal.  Musculoskeletal:       Right lower leg: She exhibits tenderness.        Legs: Right lower leg reveals 4 small 1 to 28mm diameter puncture wounds as noted on diagram.  No swelling.  Minimal erythema.  Area is tender to palpation  Neurological: She is alert.  Skin: Skin is warm and dry.  Nursing note and vitals reviewed.    UC Treatments / Results  Labs (all labs ordered are listed, but only abnormal results are displayed) Labs Reviewed - No data to display  EKG  EKG Interpretation None       Radiology No results found.  Procedures Procedures (including critical care time)  Medications Ordered in UC Medications - No data to display   Initial Impression / Assessment and Plan / UC Course  I have reviewed the triage vital signs and the nursing notes.  Pertinent labs & imaging results that were available during my care of the patient were reviewed by me and considered in my medical decision making (see chart for details).    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 May continue Naprosyn.  Elevate leg.  Wear support hose if leg swelling occurs.  May apply ice pack for about two days to minimize swelling. If symptoms become significantly worse during the night or over the weekend, proceed to the local emergency room.  Followup with Family Doctor if not improved in one week.     Final Clinical Impressions(s) / UC Diagnoses   Final diagnoses:  Cat bite of right lower leg with infection, initial encounter    New Prescriptions New Prescriptions   AMOXICILLIN-CLAVULANATE (AUGMENTIN) 875-125 MG TABLET    Take 1 tablet by mouth 2 (two) times daily. Take with food     Kandra Nicolas, MD 04/25/17 [phone]

## 2017-04-17 NOTE — ED Triage Notes (Signed)
Pt c/o cat bite to her RT lower leg x last night. Denies fever. Reports she owns the cat and he is up to date on immunizations.

## 2017-05-04 DIAGNOSIS — Z6821 Body mass index (BMI) 21.0-21.9, adult: Secondary | ICD-10-CM | POA: Diagnosis not present

## 2017-05-04 DIAGNOSIS — Z01419 Encounter for gynecological examination (general) (routine) without abnormal findings: Secondary | ICD-10-CM | POA: Diagnosis not present

## 2017-05-08 ENCOUNTER — Other Ambulatory Visit: Payer: Self-pay | Admitting: Nurse Practitioner

## 2017-05-08 DIAGNOSIS — N6310 Unspecified lump in the right breast, unspecified quadrant: Secondary | ICD-10-CM

## 2017-05-11 ENCOUNTER — Ambulatory Visit
Admission: RE | Admit: 2017-05-11 | Discharge: 2017-05-11 | Disposition: A | Discharge status: Home / Self Care | Payer: 59 | Source: Ambulatory Visit | Attending: Nurse Practitioner | Admitting: Nurse Practitioner

## 2017-05-11 DIAGNOSIS — R928 Other abnormal and inconclusive findings on diagnostic imaging of breast: Secondary | ICD-10-CM | POA: Diagnosis not present

## 2017-05-11 DIAGNOSIS — N6310 Unspecified lump in the right breast, unspecified quadrant: Secondary | ICD-10-CM

## 2017-05-11 DIAGNOSIS — N6489 Other specified disorders of breast: Secondary | ICD-10-CM | POA: Diagnosis not present

## 2017-05-16 ENCOUNTER — Encounter (HOSPITAL_COMMUNITY): Payer: Self-pay

## 2017-05-16 ENCOUNTER — Encounter (HOSPITAL_COMMUNITY)
Admission: RE | Admit: 2017-05-16 | Discharge: 2017-05-16 | Disposition: A | Discharge status: Home / Self Care | Payer: 59 | Source: Ambulatory Visit | Attending: Orthopedic Surgery | Admitting: Orthopedic Surgery

## 2017-05-16 DIAGNOSIS — Z01812 Encounter for preprocedural laboratory examination: Secondary | ICD-10-CM | POA: Diagnosis not present

## 2017-05-16 HISTORY — DX: Major depressive disorder, single episode, unspecified: F32.9

## 2017-05-16 HISTORY — DX: Headache, unspecified: R51.9

## 2017-05-16 HISTORY — DX: Headache: R51

## 2017-05-16 HISTORY — DX: Other specified postprocedural states: Z98.890

## 2017-05-16 HISTORY — DX: Depression, unspecified: F32.A

## 2017-05-16 HISTORY — DX: Nausea with vomiting, unspecified: R11.2

## 2017-05-16 LAB — BASIC METABOLIC PANEL
Anion gap: 6 (ref 5–15)
BUN: 11 mg/dL (ref 6–20)
CALCIUM: 9.9 mg/dL (ref 8.9–10.3)
CHLORIDE: 105 mmol/L (ref 101–111)
CO2: 24 mmol/L (ref 22–32)
CREATININE: 1.07 mg/dL — AB (ref 0.44–1.00)
GFR calc non Af Amer: 54 mL/min — ABNORMAL LOW (ref >60)
GLUCOSE: 92 mg/dL (ref 65–99)
Potassium: 4.3 mmol/L (ref 3.5–5.1)
Sodium: 135 mmol/L (ref 135–145)

## 2017-05-16 LAB — CBC
HCT: 44.8 % (ref 36.0–46.0)
Hemoglobin: 14.9 g/dL (ref 12.0–15.0)
MCH: 29.8 pg (ref 26.0–34.0)
MCHC: 33.3 g/dL (ref 30.0–36.0)
MCV: 89.6 fL (ref 78.0–100.0)
PLATELETS: 242 10*3/uL (ref 150–400)
RBC: 5 MIL/uL (ref 3.87–5.11)
RDW: 12.9 % (ref 11.5–15.5)
WBC: 6.3 10*3/uL (ref 4.0–10.5)

## 2017-05-16 NOTE — Progress Notes (Signed)
PCP: Dr. Kathyrn Lass Cardiologist: denies   ECHO, Stress Test, Cardiac Cath: Denies   Patient denies shortness of breath, fever, cough, and chest pain at PAT appointment.  Patient verbalized understanding of instructions provided today at the PAT appointment.  Patient asked to review instructions at home and day of surgery.    Patients reports only able to tolerate Tylenol #3 for pain management and would prefer this after surgery for post-op pain management.  Instructed pt to call surgeon's office and/or speak to surgeon day of surgery to make this request.   Pt also reports not wanting to use scopolamine patch, or phenergan.  Prefers Zofran for N/V control.

## 2017-05-16 NOTE — Pre-Procedure Instructions (Signed)
Arthurine Oleary Poulton  05/16/2017      [facility] [place], [place] [address] Phone: [phone] Fax: [phone]  [facility] [place] [address], [place] - [address] [place] [address] Phone: [phone] Fax: [phone]    Your procedure is scheduled on June 28  Report to [facility] at 1100 A.M.  Call this number if you have problems the morning of surgery:  [phone]   Remember:  Do not eat food or drink liquids after midnight.  Take these medicines the morning of surgery with A SIP OF WATER Fluoxetine (Prozac),rizatriptan (Maxalt) if needed  Stop taking aspirin as directed by your Dr.  Stop taking BC's, Goody's, Herbal medications, Fish Oil, ibuprofen, Advil, Motrin, Aleve, Naprosyn, Vitamins    Do not wear jewelry, make-up or nail polish.  Do not wear lotions, powders, or perfumes, or deoderant.  Do not shave 48 hours prior to surgery.  Men may shave face and neck.  Do not bring valuables to the hospital.  [facility] is not responsible for any belongings or valuables.  Contacts, dentures or bridgework may not be worn into surgery.  Leave your suitcase in the car.  After surgery it may be brought to your room.  For patients admitted to the hospital, discharge time will be determined by your treatment team.  Patients discharged the day of surgery will not be allowed to drive home.    Special instructions:  [place] - Preparing for Surgery  Before surgery, you can play an important role.  Because skin is not sterile, your skin needs to be as free of germs as possible.  You can reduce the number of germs on you skin by washing with CHG (chlorahexidine gluconate) soap before surgery.  CHG is an antiseptic cleaner which kills germs and bonds with the skin to continue killing germs even after washing.  Please DO NOT use if you have an allergy to  CHG or antibacterial soaps.  If your skin becomes reddened/irritated stop using the CHG and inform your nurse when you arrive at Short Stay.  Do not shave (including legs and underarms) for at least 48 hours prior to the first CHG shower.  You may shave your face.  Please follow these instructions carefully:   1.  Shower with CHG Soap the night before surgery and the  morning of Surgery.  2.  If you choose to wash your hair, wash your hair first as usual with your  normal shampoo.  3.  After you shampoo, rinse your hair and body thoroughly to remove the   Shampoo.  4.  Use CHG as you would any other liquid soap.  You can apply chg directly to the skin and wash gently with scrungie or a clean washcloth.  5.  Apply the CHG Soap to your body ONLY FROM THE NECK DOWN.  Do not use on open wounds or open sores.  Avoid contact with your eyes,       ears, mouth and genitals (private parts).  Wash genitals (private parts)  with your normal soap.  6.  Wash thoroughly, paying special attention to the area where your surgery will be performed.  7.  Thoroughly rinse your body with warm water from the neck down.  8.  DO NOT shower/wash with your normal soap after using and rinsing off  the CHG Soap.  9.  Pat yourself dry with a clean towel.            10.  Wear clean pajamas.            11.  Place clean sheets on your bed the night of your first shower and do not sleep with pets.  Day of Surgery  Do not apply any lotions/deoderants the morning of surgery.  Please wear clean clothes to the hospital/surgery center.     Please read over the following fact sheets that you were given. Pain Booklet, Coughing and Deep Breathing, MRSA Information and Surgical Site Infection Prevention

## 2017-05-24 MED ORDER — CEFAZOLIN SODIUM-DEXTROSE 2-4 GM/100ML-% IV SOLN
2.0000 g | INTRAVENOUS | Status: AC
  Administered 2017-05-25: 2 g via INTRAVENOUS
  Filled 2017-05-24: qty 100

## 2017-05-25 ENCOUNTER — Encounter (HOSPITAL_COMMUNITY)
Admission: RE | Disposition: A | Discharge status: Home / Self Care | Payer: Self-pay | Source: Ambulatory Visit | Attending: Orthopedic Surgery

## 2017-05-25 ENCOUNTER — Ambulatory Visit (HOSPITAL_COMMUNITY)
Admission: RE | Admit: 2017-05-25 | Discharge: 2017-05-25 | Disposition: A | Discharge status: Home / Self Care | Payer: 59 | Source: Ambulatory Visit | Attending: Orthopedic Surgery | Admitting: Orthopedic Surgery

## 2017-05-25 ENCOUNTER — Ambulatory Visit (HOSPITAL_COMMUNITY): Payer: 59 | Admitting: Anesthesiology

## 2017-05-25 ENCOUNTER — Encounter (HOSPITAL_COMMUNITY): Payer: Self-pay | Admitting: *Deleted

## 2017-05-25 DIAGNOSIS — M75112 Incomplete rotator cuff tear or rupture of left shoulder, not specified as traumatic: Secondary | ICD-10-CM | POA: Diagnosis not present

## 2017-05-25 DIAGNOSIS — R600 Localized edema: Secondary | ICD-10-CM | POA: Diagnosis not present

## 2017-05-25 DIAGNOSIS — Z791 Long term (current) use of non-steroidal anti-inflammatories (NSAID): Secondary | ICD-10-CM | POA: Diagnosis not present

## 2017-05-25 DIAGNOSIS — S46092A Other injury of muscle(s) and tendon(s) of the rotator cuff of left shoulder, initial encounter: Secondary | ICD-10-CM | POA: Diagnosis not present

## 2017-05-25 DIAGNOSIS — X58XXXA Exposure to other specified factors, initial encounter: Secondary | ICD-10-CM | POA: Insufficient documentation

## 2017-05-25 DIAGNOSIS — G8918 Other acute postprocedural pain: Secondary | ICD-10-CM | POA: Diagnosis not present

## 2017-05-25 DIAGNOSIS — M7542 Impingement syndrome of left shoulder: Secondary | ICD-10-CM | POA: Insufficient documentation

## 2017-05-25 DIAGNOSIS — G43909 Migraine, unspecified, not intractable, without status migrainosus: Secondary | ICD-10-CM | POA: Diagnosis not present

## 2017-05-25 DIAGNOSIS — S43492A Other sprain of left shoulder joint, initial encounter: Secondary | ICD-10-CM | POA: Diagnosis not present

## 2017-05-25 DIAGNOSIS — M19012 Primary osteoarthritis, left shoulder: Secondary | ICD-10-CM | POA: Insufficient documentation

## 2017-05-25 DIAGNOSIS — I890 Lymphedema, not elsewhere classified: Secondary | ICD-10-CM | POA: Diagnosis not present

## 2017-05-25 DIAGNOSIS — Z7982 Long term (current) use of aspirin: Secondary | ICD-10-CM | POA: Insufficient documentation

## 2017-05-25 DIAGNOSIS — Z79899 Other long term (current) drug therapy: Secondary | ICD-10-CM | POA: Diagnosis not present

## 2017-05-25 DIAGNOSIS — S43432A Superior glenoid labrum lesion of left shoulder, initial encounter: Secondary | ICD-10-CM | POA: Diagnosis not present

## 2017-05-25 DIAGNOSIS — F329 Major depressive disorder, single episode, unspecified: Secondary | ICD-10-CM | POA: Insufficient documentation

## 2017-05-25 DIAGNOSIS — M25812 Other specified joint disorders, left shoulder: Secondary | ICD-10-CM | POA: Diagnosis present

## 2017-05-25 DIAGNOSIS — M75122 Complete rotator cuff tear or rupture of left shoulder, not specified as traumatic: Secondary | ICD-10-CM | POA: Diagnosis not present

## 2017-05-25 SURGERY — SHOULDER ARTHROSCOPY WITH SUBACROMIAL DECOMPRESSION AND DISTAL CLAVICLE EXCISION
Anesthesia: General | Site: Shoulder | Laterality: Left

## 2017-05-25 MED ORDER — ONDANSETRON HCL 4 MG/2ML IJ SOLN
INTRAMUSCULAR | Status: DC | PRN
  Administered 2017-05-25: 4 mg via INTRAVENOUS

## 2017-05-25 MED ORDER — ONDANSETRON HCL 4 MG PO TABS: 4.0000 mg | ORAL_TABLET | Freq: Three times a day (TID) | ORAL | 0 refills | Status: DC | PRN

## 2017-05-25 MED ORDER — DIAZEPAM 5 MG PO TABS
5.0000 mg | ORAL_TABLET | Freq: Once | ORAL | Status: AC
  Administered 2017-05-25: 5 mg via ORAL

## 2017-05-25 MED ORDER — SUGAMMADEX SODIUM 200 MG/2ML IV SOLN
INTRAVENOUS | Status: DC | PRN
  Administered 2017-05-25: 200 mg via INTRAVENOUS

## 2017-05-25 MED ORDER — ONDANSETRON HCL 4 MG/2ML IJ SOLN
INTRAMUSCULAR | Status: AC
  Filled 2017-05-25: qty 2

## 2017-05-25 MED ORDER — FENTANYL CITRATE (PF) 100 MCG/2ML IJ SOLN
INTRAMUSCULAR | Status: DC | PRN
  Administered 2017-05-25: 50 ug via INTRAVENOUS

## 2017-05-25 MED ORDER — SUGAMMADEX SODIUM 200 MG/2ML IV SOLN
INTRAVENOUS | Status: AC
  Filled 2017-05-25: qty 2

## 2017-05-25 MED ORDER — MIDAZOLAM HCL 2 MG/2ML IJ SOLN
INTRAMUSCULAR | Status: AC
Start: 2017-05-25 — End: 2017-05-25
  Administered 2017-05-25: 3 mg via INTRAVENOUS
  Filled 2017-05-25: qty 2

## 2017-05-25 MED ORDER — METOCLOPRAMIDE HCL 5 MG/ML IJ SOLN
10.0000 mg | Freq: Once | INTRAMUSCULAR | Status: DC | PRN
Start: 2017-05-25 — End: 2017-05-25

## 2017-05-25 MED ORDER — MIDAZOLAM HCL 2 MG/2ML IJ SOLN
INTRAMUSCULAR | Status: AC
  Filled 2017-05-25: qty 2

## 2017-05-25 MED ORDER — FENTANYL CITRATE (PF) 100 MCG/2ML IJ SOLN
100.0000 ug | Freq: Once | INTRAMUSCULAR | Status: AC
  Administered 2017-05-25: 100 ug via INTRAVENOUS

## 2017-05-25 MED ORDER — DEXAMETHASONE SODIUM PHOSPHATE 10 MG/ML IJ SOLN
INTRAMUSCULAR | Status: DC | PRN
  Administered 2017-05-25: 10 mg via INTRAVENOUS

## 2017-05-25 MED ORDER — MIDAZOLAM HCL 2 MG/2ML IJ SOLN
3.0000 mg | Freq: Once | INTRAMUSCULAR | Status: AC
  Administered 2017-05-25: 3 mg via INTRAVENOUS

## 2017-05-25 MED ORDER — PROMETHAZINE HCL 25 MG/ML IJ SOLN: 6.2500 mg | INTRAMUSCULAR | Status: DC | PRN

## 2017-05-25 MED ORDER — DEXAMETHASONE SODIUM PHOSPHATE 10 MG/ML IJ SOLN
INTRAMUSCULAR | Status: AC
  Filled 2017-05-25: qty 1

## 2017-05-25 MED ORDER — FENTANYL CITRATE (PF) 100 MCG/2ML IJ SOLN
INTRAMUSCULAR | Status: AC
  Administered 2017-05-25: 100 ug via INTRAVENOUS
  Filled 2017-05-25: qty 2

## 2017-05-25 MED ORDER — CHLORHEXIDINE GLUCONATE 4 % EX LIQD: 60.0000 mL | Freq: Once | CUTANEOUS | Status: DC

## 2017-05-25 MED ORDER — SCOPOLAMINE 1 MG/3DAYS TD PT72
MEDICATED_PATCH | TRANSDERMAL | Status: AC
  Filled 2017-05-25: qty 1

## 2017-05-25 MED ORDER — PHENYLEPHRINE 40 MCG/ML (10ML) SYRINGE FOR IV PUSH (FOR BLOOD PRESSURE SUPPORT)
PREFILLED_SYRINGE | INTRAVENOUS | Status: AC
  Filled 2017-05-25: qty 10

## 2017-05-25 MED ORDER — ROCURONIUM BROMIDE 100 MG/10ML IV SOLN
INTRAVENOUS | Status: DC | PRN
  Administered 2017-05-25: 40 mg via INTRAVENOUS

## 2017-05-25 MED ORDER — LIDOCAINE HCL (CARDIAC) 20 MG/ML IV SOLN
INTRAVENOUS | Status: DC | PRN
  Administered 2017-05-25: 20 mg via INTRAVENOUS

## 2017-05-25 MED ORDER — LACTATED RINGERS IV SOLN
INTRAVENOUS | Status: DC
  Administered 2017-05-25: 11:00:00 via INTRAVENOUS

## 2017-05-25 MED ORDER — ACETAMINOPHEN-CODEINE #3 300-30 MG PO TABS: 1.0000 | ORAL_TABLET | ORAL | 0 refills | Status: DC | PRN

## 2017-05-25 MED ORDER — DIAZEPAM 5 MG PO TABS
ORAL_TABLET | ORAL | Status: AC
  Filled 2017-05-25: qty 1

## 2017-05-25 MED ORDER — PROPOFOL 10 MG/ML IV BOLUS
INTRAVENOUS | Status: AC
  Filled 2017-05-25: qty 20

## 2017-05-25 MED ORDER — DIAZEPAM 5 MG PO TABS: 2.5000 mg | ORAL_TABLET | Freq: Four times a day (QID) | ORAL | 1 refills | Status: DC | PRN

## 2017-05-25 MED ORDER — SODIUM CHLORIDE 0.9 % IR SOLN
Status: DC | PRN
  Administered 2017-05-25: 6000 mL

## 2017-05-25 MED ORDER — LIDOCAINE 2% (20 MG/ML) 5 ML SYRINGE
INTRAMUSCULAR | Status: AC
  Filled 2017-05-25: qty 5

## 2017-05-25 MED ORDER — PHENYLEPHRINE 40 MCG/ML (10ML) SYRINGE FOR IV PUSH (FOR BLOOD PRESSURE SUPPORT)
PREFILLED_SYRINGE | INTRAVENOUS | Status: DC | PRN
  Administered 2017-05-25 (×4): 80 ug via INTRAVENOUS

## 2017-05-25 MED ORDER — NEOSTIGMINE METHYLSULFATE 5 MG/5ML IV SOSY
PREFILLED_SYRINGE | INTRAVENOUS | Status: AC
  Filled 2017-05-25: qty 5

## 2017-05-25 MED ORDER — BUPIVACAINE-EPINEPHRINE (PF) 0.5% -1:200000 IJ SOLN
INTRAMUSCULAR | Status: DC | PRN
  Administered 2017-05-25: 25 mL via PERINEURAL

## 2017-05-25 MED ORDER — FENTANYL CITRATE (PF) 100 MCG/2ML IJ SOLN: 25.0000 ug | INTRAMUSCULAR | Status: DC | PRN

## 2017-05-25 MED ORDER — PROPOFOL 10 MG/ML IV BOLUS
INTRAVENOUS | Status: DC | PRN
  Administered 2017-05-25: 130 mg via INTRAVENOUS

## 2017-05-25 MED ORDER — NAPROXEN 500 MG PO TABS: 500.0000 mg | ORAL_TABLET | Freq: Two times a day (BID) | ORAL | 1 refills | Status: DC

## 2017-05-25 MED ORDER — FENTANYL CITRATE (PF) 250 MCG/5ML IJ SOLN
INTRAMUSCULAR | Status: AC
  Filled 2017-05-25: qty 5

## 2017-05-25 MED ORDER — FENTANYL CITRATE (PF) 100 MCG/2ML IJ SOLN
25.0000 ug | INTRAMUSCULAR | Status: DC | PRN
Start: 2017-05-25 — End: 2017-05-25

## 2017-05-25 SURGICAL SUPPLY — 51 items
BLADE CUTTER GATOR 3.5 (BLADE) ×2 IMPLANT
BLADE GREAT WHITE 4.2 (BLADE) ×2 IMPLANT
BLADE SURG 11 STRL SS (BLADE) ×2 IMPLANT
BOOTCOVER CLEANROOM LRG (PROTECTIVE WEAR) ×4 IMPLANT
BUR OVAL 4.0 (BURR) ×2 IMPLANT
CANISTER SUCT LVC 12 LTR MEDI- (MISCELLANEOUS) ×2 IMPLANT
CANNULA ACUFLEX KIT 5X76 (CANNULA) ×2 IMPLANT
CANNULA DRILOCK 5.0X75 (CANNULA) ×2 IMPLANT
CONNECTOR 5 IN 1 STRAIGHT STRL (MISCELLANEOUS) ×2 IMPLANT
DRAPE INCISE 23X17 IOBAN STRL (DRAPES)
DRAPE INCISE IOBAN 23X17 STRL (DRAPES) IMPLANT
DRAPE INCISE IOBAN 66X45 STRL (DRAPES) ×2 IMPLANT
DRAPE ORTHO SPLIT 77X108 STRL (DRAPES) ×2
DRAPE STERI 35X30 U-POUCH (DRAPES) IMPLANT
DRAPE SURG 17X11 SM STRL (DRAPES) ×2 IMPLANT
DRAPE SURG ORHT 6 SPLT 77X108 (DRAPES) ×2 IMPLANT
DRAPE U-SHAPE 47X51 STRL (DRAPES) IMPLANT
DRSG PAD ABDOMINAL 8X10 ST (GAUZE/BANDAGES/DRESSINGS) ×2 IMPLANT
DURAPREP 26ML APPLICATOR (WOUND CARE) ×2 IMPLANT
GAUZE SPONGE 4X4 12PLY STRL (GAUZE/BANDAGES/DRESSINGS) ×2 IMPLANT
GLOVE BIO SURGEON STRL SZ7.5 (GLOVE) ×2 IMPLANT
GLOVE BIO SURGEON STRL SZ8 (GLOVE) ×2 IMPLANT
GLOVE EUDERMIC 7 POWDERFREE (GLOVE) ×2 IMPLANT
GLOVE SS BIOGEL STRL SZ 7.5 (GLOVE) ×1 IMPLANT
GLOVE SUPERSENSE BIOGEL SZ 7.5 (GLOVE) ×1
GOWN STRL REUS W/ TWL LRG LVL3 (GOWN DISPOSABLE) ×1 IMPLANT
GOWN STRL REUS W/ TWL XL LVL3 (GOWN DISPOSABLE) ×2 IMPLANT
GOWN STRL REUS W/TWL LRG LVL3 (GOWN DISPOSABLE) ×1
GOWN STRL REUS W/TWL XL LVL3 (GOWN DISPOSABLE) ×2
KIT BASIN OR (CUSTOM PROCEDURE TRAY) ×2 IMPLANT
KIT ROOM TURNOVER OR (KITS) ×2 IMPLANT
KIT SHOULDER TRACTION (DRAPES) ×2 IMPLANT
MANIFOLD NEPTUNE II (INSTRUMENTS) ×2 IMPLANT
NEEDLE SPNL 18GX3.5 QUINCKE PK (NEEDLE) ×2 IMPLANT
NS IRRIG 1000ML POUR BTL (IV SOLUTION) ×2 IMPLANT
PACK SHOULDER (CUSTOM PROCEDURE TRAY) ×2 IMPLANT
PAD ARMBOARD 7.5X6 YLW CONV (MISCELLANEOUS) ×4 IMPLANT
SET ARTHROSCOPY TUBING (MISCELLANEOUS) ×1
SET ARTHROSCOPY TUBING LN (MISCELLANEOUS) ×1 IMPLANT
SLING ARM FOAM STRAP LRG (SOFTGOODS) ×2 IMPLANT
SLING ARM FOAM STRAP MED (SOFTGOODS) IMPLANT
SPONGE LAP 4X18 X RAY DECT (DISPOSABLE) IMPLANT
STRIP CLOSURE SKIN 1/2X4 (GAUZE/BANDAGES/DRESSINGS) ×2 IMPLANT
SUT MNCRL AB 3-0 PS2 18 (SUTURE) ×2 IMPLANT
SUT PDS AB 0 CT 36 (SUTURE) IMPLANT
SYR 20CC LL (SYRINGE) IMPLANT
TAPE PAPER 3X10 WHT MICROPORE (GAUZE/BANDAGES/DRESSINGS) ×2 IMPLANT
TOWEL OR 17X24 6PK STRL BLUE (TOWEL DISPOSABLE) ×2 IMPLANT
TOWEL OR 17X26 10 PK STRL BLUE (TOWEL DISPOSABLE) IMPLANT
WAND SUCTION MAX 4MM 90S (SURGICAL WAND) ×2 IMPLANT
WATER STERILE IRR 1000ML POUR (IV SOLUTION) ×2 IMPLANT

## 2017-05-25 NOTE — Anesthesia Procedure Notes (Signed)
Procedure Name: Intubation Date/Time: 05/25/2017 12:14 PM Performed by: Rush Farmer E Pre-anesthesia Checklist: Patient identified, Emergency Drugs available, Suction available and Patient being monitored Patient Re-evaluated:Patient Re-evaluated prior to inductionOxygen Delivery Method: Circle system utilized Preoxygenation: Pre-oxygenation with 100% oxygen Intubation Type: IV induction Ventilation: Mask ventilation without difficulty Laryngoscope Size: Mac and 3 Grade View: Grade I Tube type: Oral Tube size: 7.0 mm Number of attempts: 1 Airway Equipment and Method: Stylet Placement Confirmation: ETT inserted through vocal cords under direct vision,  positive ETCO2 and breath sounds checked- equal and bilateral Secured at: 21 cm Tube secured with: Tape Dental Injury: Teeth and Oropharynx as per pre-operative assessment

## 2017-05-25 NOTE — Anesthesia Postprocedure Evaluation (Signed)
Anesthesia Post Note  Patient: Mary Freeman  Procedure(s) Performed: Procedure(s) (LRB): LEFT SHOULDER ARTHROSCOPY WITH SUBACROMIAL DECOMPRESSION AND DISTAL CLAVICLE EXCISION (Left)     Patient location during evaluation: PACU Anesthesia Type: General and Regional Level of consciousness: awake and alert Pain management: pain level controlled Vital Signs Assessment: post-procedure vital signs reviewed and stable Respiratory status: spontaneous breathing, nonlabored ventilation, respiratory function stable and patient connected to nasal cannula oxygen Cardiovascular status: blood pressure returned to baseline and stable Postop Assessment: no signs of nausea or vomiting Anesthetic complications: no    Last Vitals:  Vitals:   05/25/17 1409 05/25/17 1413  BP:  (!) 107/57  Pulse: 72 81  Resp: (!) 21 20  Temp: 36.1 C     Last Pain:  Vitals:   05/25/17 1413  TempSrc:   PainSc: 0-No pain                 Montez Hageman

## 2017-05-25 NOTE — Op Note (Signed)
05/25/2017  1:25 PM  PATIENT:   Mary Freeman  66 y.o. female  PRE-OPERATIVE DIAGNOSIS:  Left shoulder impingement, AC joint osteoarthritis   POST-OPERATIVE DIAGNOSIS:  Same with labral tear and partial articular rotator cuff tear  PROCEDURE:  RSA, labral and rotator cuff debridement, SAD, DCR  SURGEON:  , Metta Clines M.D.  ASSISTANTS: Shuford pac   ANESTHESIA:   GET + ISB  EBL: min  SPECIMEN:  none  Drains: none   PATIENT DISPOSITION:  PACU - hemodynamically stable.    PLAN OF CARE: Discharge to home after PACU  Dictation# [phone] [id number]   Contact # [phone]

## 2017-05-25 NOTE — Discharge Instructions (Signed)
° °  Mary Freeman. Supple, M.D., F.A.A.O.S. Orthopa[facility] Specializing in Arthroscopic and Reconstructive Surgery of the Shoulder and Knee [phone] [address] [place], [place] [address] - Fax [phone]   POST-OP SHOULDER ARTHROSCOPY INSTRUCTIONS  1. Call the office at [phone] to schedule your first post-op appointment 7-10 days from the date of your surgery.  2. Leave the steri-strips in place over your incisions when performing dressing changes and showering. You may remove your dressings and begin showering 72 hours from surgery. You can expect drainage that is clear to bloody in nature that occasionally will soak through your dressings. If this occurs go ahead and perform a dressing change. The drainage should lessen daily and when there is no drainage from your incisions feel free to go without a dressing.  3. Wear your sling for comfort. You may come out of your sling for ad lib activity and even decide not to use the sling at all. If you find you are more comfortable in your sling, make sure you come out of your sling at least 3-4 times a day to do the exercises that are included below.  4. Range of motion to your elbow, wrist, and hand are encouraged 3-5 times daily. Exercise to your hand and fingers helps to reduce swelling you may experience.  5. Utilize ice to the shoulder 3-4 times minimum a day and additionally if you are experiencing pain.  6. You may drive when safely off narcotics and muscle relaxants.  7. If you had a block pre-operatively to provide post-op pain relief you may want to go ahead and begin utilizing your pain meds as your arm begins to wake up. Blocks can sometimes last up to 16-18 hours. If you are still pain-free prior to going to bed you may want to strongly consider taking a pain medication to avoid being awakened in the night with the onset of pain. A muscle relaxant is also provided for you should you experience muscle spasms. It  is recommended that if you are experiencing pain that your pain medication alone is not controlling, add the muscle relaxant along with the pain medication which can give additional pain relief. The first one to two days is generally the most severe of your pain and then should gradually decrease. As your pain lessens it is recommended that you decrease your use of the pain medications to an "as needed basis" only and to always comply with the recommended dosages of the pain medications.  8. Pain medications can produce constipation along with their use. If you experience this, the use of an over the counter stool softener or laxative daily is recommended.   9. For additional questions or concerns, please do not hesitate to call the office. If after hours there is an answering service to forward your concerns to the physician on call.   POST-OP EXERCISES  The pendulum exercises should be performed while bending at the waist as far over as possible thereby letting gravity do the work for you.  Range of Motion Exercises: Pendulum (circular)  Repeat 20 times. Do 3 sessions per day.     Range of Motion Exercises: Pendulum (side-to-side)  Repeat 20 times. Do 3 sessions per day.    Range of Motion Exercises (self-stretching activities):  Slide arm up wall with palm toward you, moving closer to the wall. Hold for 5 seconds.  Repeat 10 times. Do 3 sessions per day.

## 2017-05-25 NOTE — H&P (Signed)
Mary Freeman    Chief Complaint: Left shoulder impingement, AC joint osteoarthritis  HPI: The patient is a 66 y.o. female with chronic left shoulder pain and impingement refractory to conservative management.  Past Medical History:  Diagnosis Date  . Depression   . Headache    migraines  . PONV (postoperative nausea and vomiting)     Past Surgical History:  Procedure Laterality Date  . APPENDECTOMY    . BREAST EXCISIONAL BIOPSY Right    benign  . COLONOSCOPY    . KNEE ARTHROSCOPY Right    x3  . KNEE ARTHROSCOPY W/ MENISCAL REPAIR Left   . LUMBAR LAMINECTOMY  1986  . REDUCTION MAMMAPLASTY Bilateral    2006  . ROTATOR CUFF REPAIR Right 2012  . TONSILLECTOMY    . TUBAL LIGATION      Family History  Problem Relation Age of Onset  . Cancer Father        lung  . Cancer Sister        breast  . Alcoholism Mother   . Alcoholism Brother   . Cancer Sister     Social History:  reports that she has never smoked. She has never used smokeless tobacco. She reports that she drinks alcohol. She reports that she does not use drugs.   Medications Prior to Admission  Medication Sig Dispense Refill  . aspirin EC 81 MG tablet Take 81 mg by mouth daily.    . Calcium-Magnesium (CAL-MAG PO) Take 1 tablet by mouth daily.    Marland Kitchen FLUoxetine (PROZAC) 20 MG tablet Take 20 mg by mouth daily.    . Multiple Vitamins-Minerals (MULTIVITAMIN WITH MINERALS) tablet Take 1 tablet by mouth daily.    . naproxen (NAPROSYN) 500 MG tablet Take 500 mg by mouth 2 (two) times daily as needed (migraines).     . rizatriptan (MAXALT) 10 MG tablet Take 10 mg by mouth as needed for migraine. May repeat in 2 hours if needed    . tizanidine (ZANAFLEX) 2 MG capsule Take 2 mg by mouth 3 (three) times daily as needed for muscle spasms. Neck pain       Physical Exam: left shoulder with painful and restricted motion, exam as noted at recent office visits  Vitals  Temp:  [97.8 F (36.6 C)] 97.8 F (36.6 C) (06/28  1050) Pulse Rate:  [70] 70 (06/28 1050) Resp:  [18] 18 (06/28 1050) BP: (109)/(50) 109/50 (06/28 1050) SpO2:  [99 %] 99 % (06/28 1050) Weight:  [63.5 kg (140 lb 1.6 oz)] 63.5 kg (140 lb 1.6 oz) (06/28 1050)  Assessment/Plan  Impression: Left shoulder impingement, AC joint osteoarthritis   Plan of Action: Procedure(s): LEFT SHOULDER ARTHROSCOPY WITH SUBACROMIAL DECOMPRESSION AND DISTAL CLAVICLE EXCISION   M  05/25/2017, 11:27 AM Contact # [phone]

## 2017-05-25 NOTE — Anesthesia Procedure Notes (Signed)
Anesthesia Regional Block: Interscalene brachial plexus block   Pre-Anesthetic Checklist: ,, timeout performed, Correct Patient, Correct Site, Correct Laterality, Correct Procedure, Correct Position, site marked, Risks and benefits discussed,  Surgical consent,  Pre-op evaluation,  At surgeon's request and post-op pain management  Laterality: Left  Prep: chloraprep       Needles:  Injection technique: Single-shot  Needle Type: Echogenic Stimulator Needle     Needle Length: 9cm  Needle Gauge: 21     Additional Needles:   Procedures: ultrasound guided, nerve stimulator,,,,,,   Nerve Stimulator or Paresthesia:  Response: deltoid, 0.5 mA,   Additional Responses:   Narrative:  Start time: 05/25/2017 11:29 AM End time: 05/25/2017 11:36 AM Injection made incrementally with aspirations every 5 mL.  Performed by: Personally  Anesthesiologist: Suzette Battiest

## 2017-05-25 NOTE — Anesthesia Preprocedure Evaluation (Addendum)
Anesthesia Evaluation  Patient identified by MRN, date of birth, ID band Patient awake    Reviewed: Allergy & Precautions, NPO status , Patient's Chart, lab work & pertinent test results  History of Anesthesia Complications (+) PONV  Airway Mallampati: II  TM Distance: >3 FB Neck ROM: Full    Dental  (+) Dental Advisory Given, Teeth Intact   Pulmonary neg pulmonary ROS,    breath sounds clear to auscultation       Cardiovascular negative cardio ROS   Rhythm:Regular Rate:Normal     Neuro/Psych  Headaches, Depression    GI/Hepatic negative GI ROS, Neg liver ROS,   Endo/Other  negative endocrine ROS  Renal/GU negative Renal ROS     Musculoskeletal negative musculoskeletal ROS (+)   Abdominal   Peds  Hematology negative hematology ROS (+)   Anesthesia Other Findings   Reproductive/Obstetrics                           Lab Results  Component Value Date   WBC 6.3 05/16/2017   HGB 14.9 05/16/2017   HCT 44.8 05/16/2017   MCV 89.6 05/16/2017   PLT 242 05/16/2017   Lab Results  Component Value Date   CREATININE 1.07 (H) 05/16/2017   BUN 11 05/16/2017   NA 135 05/16/2017   K 4.3 05/16/2017   CL 105 05/16/2017   CO2 24 05/16/2017    Anesthesia Physical Anesthesia Plan  ASA: I  Anesthesia Plan: General   Post-op Pain Management:  Regional for Post-op pain   Induction: Intravenous  PONV Risk Score and Plan: 4 or greater and Ondansetron, Dexamethasone, Propofol, Midazolam and Treatment may vary due to age or medical condition  Airway Management Planned: Oral ETT  Additional Equipment:   Intra-op Plan:   Post-operative Plan: Extubation in OR  Informed Consent: I have reviewed the patients History and Physical, chart, labs and discussed the procedure including the risks, benefits and alternatives for the proposed anesthesia with the patient or authorized representative who has  indicated his/her understanding and acceptance.   Dental advisory given  Plan Discussed with: CRNA  Anesthesia Plan Comments:        Anesthesia Quick Evaluation

## 2017-05-25 NOTE — Transfer of Care (Signed)
Immediate Anesthesia Transfer of Care Note  Patient: Mary Freeman  Procedure(s) Performed: Procedure(s): LEFT SHOULDER ARTHROSCOPY WITH SUBACROMIAL DECOMPRESSION AND DISTAL CLAVICLE EXCISION (Left)  Patient Location: PACU  Anesthesia Type:GA combined with regional for post-op pain  Level of Consciousness: awake, alert  and oriented  Airway & Oxygen Therapy: Patient Spontanous Breathing and Patient connected to nasal cannula oxygen  Post-op Assessment: Report given to RN, Post -op Vital signs reviewed and stable and Patient moving all extremities  Post vital signs: Reviewed and stable  Last Vitals:  Vitals:   05/25/17 1140 05/25/17 1145  BP: (!) 90/47   Pulse: 79 80  Resp: (!) 9 13  Temp:      Last Pain:  Vitals:   05/25/17 1140  TempSrc:   PainSc: 0-No pain      Patients Stated Pain Goal: 3 (97/28/20 6015)  Complications: No apparent anesthesia complications

## 2017-05-26 NOTE — Op Note (Signed)
NAME:  Mary Freeman, Mary Freeman                     ACCOUNT NO.:  MEDICAL RECORD NO.:  [id number]  LOCATION:                                 FACILITY:  PHYSICIAN:  Metta Clines. , M.D.       DATE OF BIRTH:  DATE OF PROCEDURE:  05/25/2017 DATE OF DISCHARGE:                              OPERATIVE REPORT   PREOPERATIVE DIAGNOSES: 1. Chronic left shoulder pain related to impingement syndrome. 2. Left shoulder symptomatic AC joint arthritis.  POSTOPERATIVE DIAGNOSES: 1. Chronic left shoulder pain related to impingement syndrome. 2. Left shoulder symptomatic AC joint arthritis. 3. Partial articular rotator cuff tear. 4. Glenoid labral tear.  PROCEDURE: 1. Left shoulder examination under anesthesia. 2. Left shoulder glenoid joint diagnostic arthroscopy. 3. Labral and rotator cuff debridement. 4. Arthroscopic subacromial decompression and bursectomy. 5. Arthroscopic distal clavicle resection.  SURGEON:  Metta Clines. , M.D.  ASSISTANT:  Reather Laurence Shuford, PA-C.  ANESTHESIA:  General endotracheal as well as interscalene block.  ESTIMATED BLOOD LOSS:  Minimal.  DRAINS:  None.  HISTORY:  Ms. Wauters is a 66 year old female, who has had chronic and progressive increasing left shoulder pain with impingement syndrome and AC joint arthritis which has been refractory to prolonged attempts at conservative management.  Due to her ongoing pain and functional limitations, she is brought to the operating room at this time for planned left shoulder arthroscopy as described below.  Preoperatively, I counseled Ms. Neitzke regarding treatment options, potential risks versus benefits thereof.  Possible surgical complications were reviewed including bleeding, infection, neurovascular injury, persistent pain, loss of motion, anesthetic complication, and possible need for additional surgery.  She understands and accepts and agrees with our planned procedure.  PROCEDURE IN DETAIL:  After undergoing routine  preop evaluation, the patient received prophylactic antibiotics.  Interscalene block established in the holding area by the Anesthesia Department.  Placed supine on the operating table, and underwent smooth induction of a general endotracheal anesthesia.  Turned to the right lateral decubitus position on a beanbag and appropriately padded and protected.  Left shoulder examination under anesthesia revealed full motion.  No instability patterns noted.  Left arm was then suspended at 70 degrees of abduction with 10 pounds of traction.  Left shoulder region was sterilely prepped and draped in standard fashion.  Time-out was called. Posterior portal established in the glenohumeral joint.  The anterior portal was established under direct visualization.  The articular surface was found to be in good condition.  There was degenerative tearing of the distal supraspinatus which we debrided with a shaver, but I would estimate that this was no more than perhaps 5% or 10% of the thickness of the tendon.  The biceps tendon itself was of normal caliber with no obvious proximal distal instability.  There was degenerative tearing of the anterior and superior labrum which we debrided with shaver, but no obvious instability to biceps anchor.  Capsular volume was within normal limits.  No obvious instability pattern.  After completing the debridement within glenohumeral joint, fluid and instrument were then removed.  The arm was dropped down to 30 degrees of abduction.  Arthroscope introduced in this posterior subacromial  space, the posterior portal and direct lateral portal established in the subacromial space.  Abundant dense bursal tissue and multiple adhesions were encountered.  These were divided and excised from the shaver and Stryker wand.  The wand was then used to remove the periosteum from the undersurface of the anterior half of the acromion.  A subacromial decompression was performed creating a  type 1 morphology.  The portals were then established directly anterior to the distal clavicle, and a distal clavicle resection was performed with a bur.  Care was taken to confirm visualization of the entire circumference of the distal clavicle to ensure adequate removal of bone.  We then completed the subacromial subdeltoid bursectomy.  The bursal surface rotator cuff was carefully inspected and found to be intact.  At this point, final hemostasis was obtained.  Fluid and instruments removed.  The portals were closed with Monocryl and a Steri-Strip.  A dry dressing was taped about the left shoulder.  Left arm was placed in a sling.  The patient was awakened, extubated, and taken to the recovery room in stable condition.  Jenetta Loges, PA-C, was used as an Environmental consultant throughout this case, essential for help with positioning the patient, positioning the extremity, management of the arthroscopic equipment, wound closure, and intraoperative decision making.     Metta Clines. , M.D.     KMS/MEDQ  D:  05/25/2017  T:  05/25/2017  Job:  [id number]

## 2017-06-06 ENCOUNTER — Ambulatory Visit (INDEPENDENT_AMBULATORY_CARE_PROVIDER_SITE_OTHER): Payer: 59 | Admitting: Rehabilitative and Restorative Service Providers"

## 2017-06-06 DIAGNOSIS — M25512 Pain in left shoulder: Secondary | ICD-10-CM

## 2017-06-06 DIAGNOSIS — M6281 Muscle weakness (generalized): Secondary | ICD-10-CM

## 2017-06-06 DIAGNOSIS — R29898 Other symptoms and signs involving the musculoskeletal system: Secondary | ICD-10-CM

## 2017-06-06 NOTE — Therapy (Signed)
[place], [place], [address] Phone: [phone]   Fax:  [phone]  Physical Therapy Evaluation  Patient Details  Name: Mary Freeman MRN: [id number] Date of Birth: 08-28-1951 Referring Provider: Dr Justice Britain   Encounter Date: 06/06/2017      PT End of Session - 06/06/17 1249    Visit Number 1   Number of Visits 18   Date for PT Re-Evaluation 07/18/17   PT Start Time 1147   PT Stop Time 1252   PT Time Calculation (min) 65 min   Activity Tolerance Patient tolerated treatment well      Past Medical History:  Diagnosis Date  . Depression   . Headache    migraines  . PONV (postoperative nausea and vomiting)     Past Surgical History:  Procedure Laterality Date  . APPENDECTOMY    . BREAST EXCISIONAL BIOPSY Right    benign  . COLONOSCOPY    . KNEE ARTHROSCOPY Right    x3  . KNEE ARTHROSCOPY W/ MENISCAL REPAIR Left   . LUMBAR LAMINECTOMY  1986  . REDUCTION MAMMAPLASTY Bilateral    2006  . ROTATOR CUFF REPAIR Right 2012  . TONSILLECTOMY    . TUBAL LIGATION      There were no vitals filed for this visit.       Subjective Assessment - 06/06/17 1157    Subjective Patient reports that she continued to have pain in the Lt shoulder with work activities. Underwent Lt arthroscopic surgery for debridement of labrum and frayed rotator cuff 05/25/17.   Pertinent History Injury 11/17 due to fall; otherwise healthy    Diagnostic tests xrays MRI   Patient Stated Goals totally well - use Lt UE for functional and work activities    Currently in Pain? Yes   Pain Score 6    Pain Location Shoulder   Pain Orientation Left   Pain Descriptors / Indicators Nagging  hurting    Pain Type Surgical pain   Pain Radiating Towards to Lt elbow    Pain Onset More than a month ago   Pain Frequency Intermittent   Aggravating Factors  lying on the Lt side; lifting; using arm    Pain Relieving Factors  meds; ice             [facility] PT Assessment - 06/06/17 0001      Assessment   Medical Diagnosis Lt shoudler dysfunction    Referring Provider Dr Justice Britain    Onset Date/Surgical Date 05/25/17   Hand Dominance Right   Prior Therapy yes     Precautions   Precautions None     Balance Screen   Has the patient fallen in the past 6 months No   Has the patient had a decrease in activity level because of a fear of falling?  No   Is the patient reluctant to leave their home because of a fear of falling?  No     Home Ecologist residence     Prior Function   Level of Independence Independent   Vocation Full time employment   Vocation Requirements RN - hospital based    Leisure yard work; household chores      Observation/Other Assessments   Focus on Therapeutic Outcomes (FOTO)  68% limitation      Sensation   Additional Comments WFL's per pt report      Posture/Postural Control   Posture Comments head  forward; shoulders rounded and elevated; scapulae abducted and rotated along the thoraicc wall      AROM   Right/Left Shoulder --  assessed in standing    Right Shoulder Extension 50 Degrees   Right Shoulder Flexion 152 Degrees   Right Shoulder ABduction 158 Degrees   Right Shoulder Internal Rotation 43 Degrees   Right Shoulder External Rotation 90 Degrees   Left Shoulder Extension 32 Degrees   Left Shoulder Flexion 61 Degrees   Left Shoulder ABduction 54 Degrees   Left Shoulder Internal Rotation 17 Degrees   Left Shoulder External Rotation 50 Degrees     Palpation   Spinal mobility hypomobile thoracic spine    Palpation comment tight Lt pecs; upper trap; leveator; teres; deltoid; biceps             Objective measurements completed on examination: See above findings.          [facility] Adult PT Treatment/Exercise - 06/06/17 0001      Neuro Re-ed    Neuro Re-ed Details  postural correction working on engaging posterior shoudler  girdle      Shoulder Exercises: Standing   Other Standing Exercises scap squeeze 10 sec x 5 some discomfort will gradually progress      Shoulder Exercises: Stretch   Internal Rotation Stretch 5 reps  10 sec hands clasp behind back sliding hands up back    External Rotation Stretch 5 reps;10 seconds  standing w/noodle elbow at 90 deg using cane    Table Stretch - Flexion 5 reps;10 seconds   Other Shoulder Stretches shd extension with cane 5 sec x 5 reps    Other Shoulder Stretches pendulum      Vasopneumatic   Number Minutes Vasopneumatic  15 minutes   Vasopnuematic Location  Shoulder   Vasopneumatic Pressure Low   Vasopneumatic Temperature  34 deg      Manual Therapy   Manual therapy comments pt supine    Joint Mobilization Lt shoulder circumduction    Soft tissue mobilization pecs; anterior deltoid; biceps; traps    Myofascial Release pecs; biceps    Scapular Mobilization Lt    Passive ROM Lt shoulder flexion                 PT Education - 06/06/17 1221    Education provided Yes   Education Details HEP ; posture and alignment    Person(s) Educated Patient   Methods Explanation;Demonstration;Tactile cues;Verbal cues;Handout   Comprehension Verbalized understanding;Returned demonstration;Verbal cues required;Tactile cues required             PT Long Term Goals - 06/06/17 1259      PT LONG TERM GOAL #1   Title Increase AROM Lt shoudler to equal or greater than AROM Lt shoudler 07/18/17   Time 6   Period Weeks   Status New     PT LONG TERM GOAL #2   Title 4+/5 to 5/5 strength Lt shoudler 07/18/17   Time 6   Period Weeks   Status New     PT LONG TERM GOAL #3   Title Decrease pain to no more than 1-2/10 for all functional and work tasks 07/18/17   Time 6   Period Weeks   Status New     PT LONG TERM GOAL #4   Title Independent in HEP 07/18/17   Time 6   Period Weeks   Status New     PT LONG TERM GOAL #5   Title Improve FOTO to </=  37% limitation  07/18/17   Time 6   Period Weeks   Status New                Plan - 06/06/17 1255    Clinical Impression Statement Cylinda presents s/p Lt shoulder scope 05/25/17 for debridement of frayed RC and partial labral tear. She has poor posture and alignment; muscular tightness to palpation; limited shoudler ROM and strength; scapular dyskinesis; decresed functional activity with Lt UE; pain and difficulty sleeping.    Clinical Presentation Stable   Clinical Decision Making Low   Rehab Potential Good   PT Frequency 3x / week   PT Duration 6 weeks   PT Treatment/Interventions Patient/family education;ADLs/Self Care Home Management;Neuromuscular re-education;Cryotherapy;Electrical Stimulation;Iontophoresis 4mg /ml Dexamethasone;Moist Heat;Ultrasound;Dry needling;Manual techniques;Therapeutic activities;Therapeutic exercise   PT Next Visit Plan review HEP; work on posterior shoudler girdle engagement; progress with PROM; begin AROM gravity assisted; pulley; focus on posture and ROM initially    Consulted and Agree with Plan of Care Patient      Patient will benefit from skilled therapeutic intervention in order to improve the following deficits and impairments:  Postural dysfunction, Improper body mechanics, Pain, Increased fascial restricitons, Increased muscle spasms, Decreased mobility, Decreased range of motion, Decreased strength, Decreased activity tolerance  Visit Diagnosis: Acute pain of left shoulder - Plan: PT plan of care cert/re-cert  Other symptoms and signs involving the musculoskeletal system - Plan: PT plan of care cert/re-cert  Muscle weakness (generalized) - Plan: PT plan of care cert/re-cert     Problem List There are no active problems to display for this patient.   [place], MPH  06/06/2017, 1:02 PM  [facility] [place], [place], [address] Phone: [phone]   Fax:   [phone]  Name: VERNE COVE MRN: [id number] Date of Birth: [birth year]

## 2017-06-06 NOTE — Patient Instructions (Addendum)
Shoulder Blade Squeeze   With noodle  Rotate shoulders back, then squeeze shoulder blades down and back. Hold 10 sec Repeat __10__ times. Do __several __ sessions per day.    External Rotator Cuff Stretch, Supine (Passive)    Lie supine, one elbow against ribs, and bent at 90, dowel in palm, other hand holding dowel up. Use other arm to push forearm toward floor. Keep elbow against side. Hold _10__ seconds. Repeat _5-10__ times per session. Do _2__ sessions per day.   Anterior Capsule Stretch, Standing    Stand holding stick behind back, hands palms up. Lift stick away from body as far as possible. Hold __10_ seconds. Repeat _10__ times per session. Do _2__ sessions per day.   Internal Rotator Cuff Stretch, Standing (Passive)    Stand and bring hand behind back, using other hand to assist. Hold __5-10_ seconds. Repeat _10__ times per session. Do _2-3__ sessions per day.  External Rotator Cuff Stretch, Sitting (Passive)    Sit with elbow bent, forearm on table, palm down. Bend forward at waist until a stretch is felt in shoulder. Hold __10_ seconds.  Repeat __10_ times per session. Do _2-3__ sessions per day.       SUPINE Tips A    Being in the supine position means to be lying on the back. Lying on the back is the position of least compression on the bones and discs of the spine, and helps to re-align the natural curves of the back. Working toward 3-5 minutes - bend elbow if you need to release stretch

## 2017-06-08 ENCOUNTER — Encounter: Payer: Self-pay | Admitting: Rehabilitative and Restorative Service Providers"

## 2017-06-08 ENCOUNTER — Ambulatory Visit (INDEPENDENT_AMBULATORY_CARE_PROVIDER_SITE_OTHER): Payer: 59 | Admitting: Rehabilitative and Restorative Service Providers"

## 2017-06-08 DIAGNOSIS — M6281 Muscle weakness (generalized): Secondary | ICD-10-CM | POA: Diagnosis not present

## 2017-06-08 DIAGNOSIS — R29898 Other symptoms and signs involving the musculoskeletal system: Secondary | ICD-10-CM | POA: Diagnosis not present

## 2017-06-08 DIAGNOSIS — M25512 Pain in left shoulder: Secondary | ICD-10-CM

## 2017-06-08 NOTE — Therapy (Signed)
[place], [place], [address] Phone: [phone]   Fax:  [phone]  Physical Therapy Treatment  Patient Details  Name: Mary Freeman MRN: [id number] Date of Birth: 1951/10/28 Referring Provider: Dr Justice Britain   Encounter Date: 06/08/2017      PT End of Session - 06/08/17 1146    Visit Number 2   Number of Visits 18   Date for PT Re-Evaluation 07/18/17   PT Start Time 5643   PT Stop Time 1244   PT Time Calculation (min) 59 min   Activity Tolerance Patient tolerated treatment well      Past Medical History:  Diagnosis Date  . Depression   . Headache    migraines  . PONV (postoperative nausea and vomiting)     Past Surgical History:  Procedure Laterality Date  . APPENDECTOMY    . BREAST EXCISIONAL BIOPSY Right    benign  . COLONOSCOPY    . KNEE ARTHROSCOPY Right    x3  . KNEE ARTHROSCOPY W/ MENISCAL REPAIR Left   . LUMBAR LAMINECTOMY  1986  . REDUCTION MAMMAPLASTY Bilateral    2006  . ROTATOR CUFF REPAIR Right 2012  . TONSILLECTOMY    . TUBAL LIGATION      There were no vitals filed for this visit.      Subjective Assessment - 06/08/17 1146    Subjective Patient has been working on her exercises 2-3 times yesterday. Not much pain    Currently in Pain? No/denies                         [facility] Adult PT Treatment/Exercise - 06/08/17 0001      Shoulder Exercises: Standing   External Rotation AAROM;Both;10 reps  palms up    Extension AAROM;Both;10 reps  with cane    Other Standing Exercises scap squeeze 10 sec x 10 some discomfort will gradually progress      Shoulder Exercises: Pulleys   Flexion --  10 sec x 10 reps    ABduction --  10 sec x 10      Shoulder Exercises: Therapy Ball   Other Therapy Ball Exercises scapular depressioin pressing into ball in sitting 5 sec x 10 reps 2 sets    Other Therapy Ball Exercises side steps with large ball on  table x 5 trips; standnig still rolling ball across body back and forth facing ball; rolling ball fwd/back ball at side x 20 reps      Shoulder Exercises: Stretch   Internal Rotation Stretch 3 reps  10 sec hands clasp behind back sliding hands up back    External Rotation Stretch 5 reps;10 seconds  standing w/noodle elbow at 90 deg using cane    Other Shoulder Stretches pendulum      Vasopneumatic   Number Minutes Vasopneumatic  15 minutes   Vasopnuematic Location  Shoulder   Vasopneumatic Pressure Low   Vasopneumatic Temperature  34 deg      Manual Therapy   Manual therapy comments pt supine    Joint Mobilization Lt shoulder circumduction    Soft tissue mobilization pecs; anterior deltoid; biceps; traps    Myofascial Release pecs; biceps    Scapular Mobilization Lt    Passive ROM Lt shoulder flexion; abduction in scaption; ER in scaption                      PT Long Term  Goals - 06/08/17 1239      PT LONG TERM GOAL #1   Title Increase AROM Lt shoudler to equal or greater than AROM Lt shoudler 07/18/17   Time 6   Period Weeks   Status On-going     PT LONG TERM GOAL #2   Title 4+/5 to 5/5 strength Lt shoudler 07/18/17   Time 6   Period Weeks   Status On-going     PT LONG TERM GOAL #3   Title Decrease pain to no more than 1-2/10 for all functional and work tasks 07/18/17   Time 6   Period Weeks   Status On-going     PT LONG TERM GOAL #4   Title Independent in HEP 07/18/17   Time 6   Status On-going     PT LONG TERM GOAL #5   Title Improve FOTO to </= 37% limitation 07/18/17   Time 6   Period Weeks   Status On-going               Plan - 06/08/17 1234    Clinical Impression Statement Mary Freeman has done well with with home program. She has minimal pain today and tolerated exercise and manual work well during today's treatment.    Rehab Potential Good   PT Frequency 3x / week   PT Duration 6 weeks   PT Treatment/Interventions Patient/family  education;ADLs/Self Care Home Management;Neuromuscular re-education;Cryotherapy;Electrical Stimulation;Iontophoresis 4mg /ml Dexamethasone;Moist Heat;Ultrasound;Dry needling;Manual techniques;Therapeutic activities;Therapeutic exercise   PT Next Visit Plan review HEP; work on posterior shoudler girdle engagement; progress with PROM; begin AROM gravity assisted; pulley; focus on posture and ROM initially    Consulted and Agree with Plan of Care Patient      Patient will benefit from skilled therapeutic intervention in order to improve the following deficits and impairments:  Postural dysfunction, Improper body mechanics, Pain, Increased fascial restricitons, Increased muscle spasms, Decreased mobility, Decreased range of motion, Decreased strength, Decreased activity tolerance  Visit Diagnosis: Acute pain of left shoulder  Other symptoms and signs involving the musculoskeletal system  Muscle weakness (generalized)     Problem List There are no active problems to display for this patient.   [place], MPH  06/08/2017, 12:40 PM  [facility] [place], [place], [address] Phone: [phone]   Fax:  [phone]  Name: Mary Freeman MRN: [id number] Date of Birth: 05/04/1951

## 2017-06-09 DIAGNOSIS — I890 Lymphedema, not elsewhere classified: Secondary | ICD-10-CM | POA: Diagnosis not present

## 2017-06-09 DIAGNOSIS — M7542 Impingement syndrome of left shoulder: Secondary | ICD-10-CM | POA: Diagnosis not present

## 2017-06-09 DIAGNOSIS — R600 Localized edema: Secondary | ICD-10-CM | POA: Diagnosis not present

## 2017-06-09 DIAGNOSIS — M75122 Complete rotator cuff tear or rupture of left shoulder, not specified as traumatic: Secondary | ICD-10-CM | POA: Diagnosis not present

## 2017-06-09 DIAGNOSIS — Z791 Long term (current) use of non-steroidal anti-inflammatories (NSAID): Secondary | ICD-10-CM | POA: Diagnosis not present

## 2017-06-09 DIAGNOSIS — M19012 Primary osteoarthritis, left shoulder: Secondary | ICD-10-CM | POA: Diagnosis not present

## 2017-06-12 ENCOUNTER — Encounter: Payer: Self-pay | Admitting: Rehabilitative and Restorative Service Providers"

## 2017-06-12 ENCOUNTER — Ambulatory Visit (INDEPENDENT_AMBULATORY_CARE_PROVIDER_SITE_OTHER): Payer: 59 | Admitting: Rehabilitative and Restorative Service Providers"

## 2017-06-12 DIAGNOSIS — R29898 Other symptoms and signs involving the musculoskeletal system: Secondary | ICD-10-CM | POA: Diagnosis not present

## 2017-06-12 DIAGNOSIS — M6281 Muscle weakness (generalized): Secondary | ICD-10-CM | POA: Diagnosis not present

## 2017-06-12 DIAGNOSIS — M25512 Pain in left shoulder: Secondary | ICD-10-CM

## 2017-06-12 NOTE — Therapy (Signed)
[place], [place], [address] Phone: [phone]   Fax:  [phone]  Physical Therapy Treatment  Patient Details  Name: Mary Freeman MRN: [id number] Date of Birth: February 17, 1951 Referring Provider: Dr Justice Britain   Encounter Date: 06/12/2017      PT End of Session - 06/12/17 1100    Visit Number 3   Number of Visits 18   PT Start Time 1100   PT Stop Time 1155   PT Time Calculation (min) 55 min   Activity Tolerance Patient tolerated treatment well      Past Medical History:  Diagnosis Date  . Depression   . Headache    migraines  . PONV (postoperative nausea and vomiting)     Past Surgical History:  Procedure Laterality Date  . APPENDECTOMY    . BREAST EXCISIONAL BIOPSY Right    benign  . COLONOSCOPY    . KNEE ARTHROSCOPY Right    x3  . KNEE ARTHROSCOPY W/ MENISCAL REPAIR Left   . LUMBAR LAMINECTOMY  1986  . REDUCTION MAMMAPLASTY Bilateral    2006  . ROTATOR CUFF REPAIR Right 2012  . TONSILLECTOMY    . TUBAL LIGATION      There were no vitals filed for this visit.      Subjective Assessment - 06/12/17 1101    Subjective Awoke at 5 am with 7/10 pain in the Lt shoulder. Took a pain pill and is feeling better now. Working on her exercises 2-3 times/day and has been doing well until this am   Currently in Pain? No/denies            [facility] PT Assessment - 06/12/17 0001      Assessment   Medical Diagnosis Lt shoudler dysfunction    Referring Provider Dr Justice Britain    Onset Date/Surgical Date 05/25/17   Hand Dominance Right   Next MD Visit 06/30/17   Prior Therapy yes     AROM   Right Shoulder Extension 50 Degrees   Right Shoulder Flexion 152 Degrees   Right Shoulder ABduction 158 Degrees   Right Shoulder Internal Rotation 43 Degrees   Right Shoulder External Rotation 90 Degrees   Left Shoulder Extension 48 Degrees   Left Shoulder Flexion 103 Degrees   Left Shoulder  ABduction 87 Degrees   Left Shoulder Internal Rotation 24 Degrees   Left Shoulder External Rotation 63 Degrees                     [facility] Adult PT Treatment/Exercise - 06/12/17 0001      Neuro Re-ed    Neuro Re-ed Details  postural correction working on engaging posterior shoudler girdle      Shoulder Exercises: Standing   External Rotation AAROM;Both;10 reps  palms up    Extension AAROM;Both;10 reps  with cane    Other Standing Exercises scap squeeze 10 sec x 10 some discomfort will gradually progress      Shoulder Exercises: Pulleys   Flexion --  10 sec x 10 reps    ABduction --  10 sec x 10      Shoulder Exercises: Therapy Ball   Other Therapy Ball Exercises scapular depressioin pressing into ball in sitting 5 sec x 10 reps 2 sets      Shoulder Exercises: Stretch   Internal Rotation Stretch 5 reps  10 sec hands clasp behind back sliding hands up back    External Rotation Stretch 5  reps;10 seconds  standing w/noodle elbow at 90 deg using cane 2 sets   Other Shoulder Stretches shd extension with cane 5 sec x 5 reps; biceps stretch 30 sec hold x 3; wall slide x 5 10 sec    Other Shoulder Stretches pendulum      Vasopneumatic   Number Minutes Vasopneumatic  15 minutes   Vasopnuematic Location  Shoulder   Vasopneumatic Pressure Low   Vasopneumatic Temperature  34 deg      Manual Therapy   Manual therapy comments pt supine    Joint Mobilization Lt shoulder circumduction    Soft tissue mobilization pecs; anterior deltoid; biceps; traps    Myofascial Release pecs; biceps    Scapular Mobilization Lt    Passive ROM Lt shoulder flexion; abduction in scaption; ER in scaption                 PT Education - 06/12/17 1128    Education provided Yes   Education Details HEP    Person(s) Educated Patient   Methods Explanation;Demonstration;Tactile cues;Verbal cues;Handout   Comprehension Verbalized understanding;Returned demonstration;Verbal cues  required;Tactile cues required             PT Long Term Goals - 06/12/17 1100      PT LONG TERM GOAL #1   Title Increase AROM Lt shoudler to equal or greater than AROM Lt shoudler 07/18/17   Time 6   Period Weeks   Status On-going     PT LONG TERM GOAL #2   Title 4+/5 to 5/5 strength Lt shoudler 07/18/17   Time 6   Period Weeks   Status On-going     PT LONG TERM GOAL #3   Title Decrease pain to no more than 1-2/10 for all functional and work tasks 07/18/17   Time 6   Period Weeks   Status On-going     PT LONG TERM GOAL #4   Title Independent in HEP 07/18/17   Time 6   Period Weeks   Status On-going     PT LONG TERM GOAL #5   Title Improve FOTO to </= 37% limitation 07/18/17   Time 6   Period Weeks   Status On-going               Plan - 06/12/17 1102    Clinical Impression Statement Progressing well with shoulder rehab. Patient awoke with pain at 5 am but symtpoms have returned to baseline now. Added exercises without difficulty. Progressing toward stated goals of therapy.    Rehab Potential Good   PT Frequency 3x / week   PT Duration 6 weeks   PT Treatment/Interventions Patient/family education;ADLs/Self Care Home Management;Neuromuscular re-education;Cryotherapy;Electrical Stimulation;Iontophoresis 4mg /ml Dexamethasone;Moist Heat;Ultrasound;Dry needling;Manual techniques;Therapeutic activities;Therapeutic exercise   PT Next Visit Plan review HEP; work on posterior shoudler girdle engagement; progress with PROM; begin AROM gravity assisted; pulley; focus on posture and ROM initially    Consulted and Agree with Plan of Care Patient      Patient will benefit from skilled therapeutic intervention in order to improve the following deficits and impairments:  Postural dysfunction, Improper body mechanics, Pain, Increased fascial restricitons, Increased muscle spasms, Decreased mobility, Decreased range of motion, Decreased strength, Decreased activity  tolerance  Visit Diagnosis: Acute pain of left shoulder  Other symptoms and signs involving the musculoskeletal system  Muscle weakness (generalized)     Problem List There are no active problems to display for this patient.    Nilda Simmer PT, MPH  06/12/2017,  12:08 PM  [facility] [place], [place], [address] Phone: [phone]   Fax:  [phone]  Name: Mary Freeman MRN: [id number] Date of Birth: Jun 27, 1951

## 2017-06-12 NOTE — Patient Instructions (Addendum)
ELBOW: Biceps - Standing holding the back of a chair of other piece of furniture     Standing in doorway, place one hand on wall, elbow straight. Lean forward. Hold _20-30__ seconds. __3-4_ reps per set, _2-3__ sets per day    External Rotator Cuff Stretch, Standing    Stand holding club behind body, one arm above head, other arm bent behind back. With lower hand, pull gently downward. Hold __20_ seconds. Repeat _3-4__ times per session. Do __2-3_ sessions per day.    Flexors Stretch, Standing    Stand near wall and slide arm up, Hold __10_ seconds.  Repeat __5_ times per session. Do _2-3 __ sessions per day..   Low Row: Standing   Face anchor, feet shoulder width apart. Palms up, pull arms back, squeezing shoulder blades together. Repeat 10__ times per set. Do 2-3__ sets per session. Do1-2_ sessions per day Anchor Height: Waist   .

## 2017-06-14 ENCOUNTER — Encounter: Payer: Self-pay | Admitting: Rehabilitative and Restorative Service Providers"

## 2017-06-14 ENCOUNTER — Ambulatory Visit (INDEPENDENT_AMBULATORY_CARE_PROVIDER_SITE_OTHER): Payer: 59 | Admitting: Rehabilitative and Restorative Service Providers"

## 2017-06-14 DIAGNOSIS — R29898 Other symptoms and signs involving the musculoskeletal system: Secondary | ICD-10-CM | POA: Diagnosis not present

## 2017-06-14 DIAGNOSIS — M6281 Muscle weakness (generalized): Secondary | ICD-10-CM

## 2017-06-14 DIAGNOSIS — M25512 Pain in left shoulder: Secondary | ICD-10-CM | POA: Diagnosis not present

## 2017-06-14 NOTE — Therapy (Signed)
[place], [place], [address] Phone: [phone]   Fax:  [phone]  Physical Therapy Treatment  Patient Details  Name: Mary Freeman MRN: [id number] Date of Birth: 04-Jul-1951 Referring Provider: Dr Justice Britain   Encounter Date: 06/14/2017      PT End of Session - 06/14/17 1107    Visit Number 4   Number of Visits 18   Date for PT Re-Evaluation 07/18/17   PT Start Time 1102   PT Stop Time 1941   PT Time Calculation (min) 54 min   Activity Tolerance Patient tolerated treatment well      Past Medical History:  Diagnosis Date  . Depression   . Headache    migraines  . PONV (postoperative nausea and vomiting)     Past Surgical History:  Procedure Laterality Date  . APPENDECTOMY    . BREAST EXCISIONAL BIOPSY Right    benign  . COLONOSCOPY    . KNEE ARTHROSCOPY Right    x3  . KNEE ARTHROSCOPY W/ MENISCAL REPAIR Left   . LUMBAR LAMINECTOMY  1986  . REDUCTION MAMMAPLASTY Bilateral    2006  . ROTATOR CUFF REPAIR Right 2012  . TONSILLECTOMY    . TUBAL LIGATION      There were no vitals filed for this visit.      Subjective Assessment - 06/14/17 1107    Subjective Some continued pain - neck was tight from exercises Monday. Working on exercises at home - splitting them up when she needs to and using ice frequently during the day. Anxious to get through the rehab    Currently in Pain? Yes   Pain Score 3    Pain Location Shoulder   Pain Orientation Left   Pain Descriptors / Indicators Nagging   Pain Type Surgical pain   Pain Onset More than a month ago                         [facility] Adult PT Treatment/Exercise - 06/14/17 0001      Shoulder Exercises: Standing   External Rotation AAROM;Both;10 reps  palms up    Extension AAROM;Both;10 reps  with cane    Row Strengthening;Both;15 reps;Theraband   Theraband Level (Shoulder Row) Level 1 (Yellow)   Other Standing  Exercises scap squeeze 10 sec x 10 some discomfort will gradually progress      Shoulder Exercises: Pulleys   Flexion --  10 sec x 10 reps    ABduction --  10 sec x 10      Shoulder Exercises: Therapy Ball   Other Therapy Ball Exercises scapular depressioin pressing into ball in sitting 5 sec x 10 reps 2 sets      Shoulder Exercises: Stretch   Internal Rotation Stretch 5 reps  10 sec hands clasp behind back sliding hands up back    External Rotation Stretch 5 reps;10 seconds  standing w/noodle elbow at 90 deg using cane 2 sets   Other Shoulder Stretches shd extension with cane 5 sec x 5 reps; biceps stretch 30 sec hold x 3; wall slide x 5 10 sec    Other Shoulder Stretches pendulum      Vasopneumatic   Number Minutes Vasopneumatic  15 minutes   Vasopnuematic Location  Shoulder   Vasopneumatic Pressure Low   Vasopneumatic Temperature  34 deg      Manual Therapy   Manual therapy comments pt supine  Joint Mobilization Lt shoulder circumduction    Soft tissue mobilization pecs; anterior deltoid; biceps; traps    Myofascial Release pecs; biceps    Scapular Mobilization Lt    Passive ROM Lt shoulder flexion; abduction in scaption; ER in scaption                      PT Long Term Goals - 06/12/17 1100      PT LONG TERM GOAL #1   Title Increase AROM Lt shoudler to equal or greater than AROM Lt shoudler 07/18/17   Time 6   Period Weeks   Status On-going     PT LONG TERM GOAL #2   Title 4+/5 to 5/5 strength Lt shoudler 07/18/17   Time 6   Period Weeks   Status On-going     PT LONG TERM GOAL #3   Title Decrease pain to no more than 1-2/10 for all functional and work tasks 07/18/17   Time 6   Period Weeks   Status On-going     PT LONG TERM GOAL #4   Title Independent in HEP 07/18/17   Time 6   Period Weeks   Status On-going     PT LONG TERM GOAL #5   Title Improve FOTO to </= 37% limitation 07/18/17   Time 6   Period Weeks   Status On-going                Plan - 06/14/17 1206    Clinical Impression Statement Pain decreased from Monday but pt continues to have tightness through Lt upper trap/leveator area. Encouraged pt not to overdo exercises and she will decrese reps over the next few days. Added more manual work through the upper trap today. Good response to manual work and vaso.     Rehab Potential Good   PT Frequency 3x / week   PT Duration 6 weeks   PT Treatment/Interventions Patient/family education;ADLs/Self Care Home Management;Neuromuscular re-education;Cryotherapy;Electrical Stimulation;Iontophoresis 4mg /ml Dexamethasone;Moist Heat;Ultrasound;Dry needling;Manual techniques;Therapeutic activities;Therapeutic exercise   PT Next Visit Plan review HEP; work on posterior shoudler girdle engagement; progress with PROM; begin AROM gravity assisted; pulley; focus on posture and ROM initially    Consulted and Agree with Plan of Care Patient      Patient will benefit from skilled therapeutic intervention in order to improve the following deficits and impairments:  Postural dysfunction, Improper body mechanics, Pain, Increased fascial restricitons, Increased muscle spasms, Decreased mobility, Decreased range of motion, Decreased strength, Decreased activity tolerance  Visit Diagnosis: Acute pain of left shoulder  Other symptoms and signs involving the musculoskeletal system  Muscle weakness (generalized)     Problem List There are no active problems to display for this patient.   [place], MPH  06/14/2017, 12:09 PM  [facility] [place], [place], [address] Phone: [phone]   Fax:  [phone]  Name: Mary Freeman MRN: [id number] Date of Birth: 1951/10/02

## 2017-06-16 ENCOUNTER — Encounter: Payer: 59 | Admitting: Rehabilitative and Restorative Service Providers"

## 2017-06-19 ENCOUNTER — Encounter: Payer: Self-pay | Admitting: Rehabilitative and Restorative Service Providers"

## 2017-06-19 ENCOUNTER — Ambulatory Visit (INDEPENDENT_AMBULATORY_CARE_PROVIDER_SITE_OTHER): Payer: 59 | Admitting: Rehabilitative and Restorative Service Providers"

## 2017-06-19 DIAGNOSIS — M25512 Pain in left shoulder: Secondary | ICD-10-CM | POA: Diagnosis not present

## 2017-06-19 DIAGNOSIS — R29898 Other symptoms and signs involving the musculoskeletal system: Secondary | ICD-10-CM

## 2017-06-19 DIAGNOSIS — M6281 Muscle weakness (generalized): Secondary | ICD-10-CM

## 2017-06-19 MED FILL — PROGESTERONE 100 MG CAPSULE: 100 | 30 days supply | Qty: 30 | Fill #0

## 2017-06-19 NOTE — Therapy (Signed)
[place], [place], [address] Phone: [phone]   Fax:  [phone]  Physical Therapy Treatment  Patient Details  Name: Mary Freeman MRN: [id number] Date of Birth: 1950/12/27 Referring Provider: Dr Justice Britain   Encounter Date: 06/19/2017      PT End of Session - 06/19/17 1316    Visit Number 5   Number of Visits 18   Date for PT Re-Evaluation 07/18/17   PT Start Time 1100   PT Stop Time 1151   PT Time Calculation (min) 51 min   Activity Tolerance Patient tolerated treatment well      Past Medical History:  Diagnosis Date  . Depression   . Headache    migraines  . PONV (postoperative nausea and vomiting)     Past Surgical History:  Procedure Laterality Date  . APPENDECTOMY    . BREAST EXCISIONAL BIOPSY Right    benign  . COLONOSCOPY    . KNEE ARTHROSCOPY Right    x3  . KNEE ARTHROSCOPY W/ MENISCAL REPAIR Left   . LUMBAR LAMINECTOMY  1986  . REDUCTION MAMMAPLASTY Bilateral    2006  . ROTATOR CUFF REPAIR Right 2012  . TONSILLECTOMY    . TUBAL LIGATION      There were no vitals filed for this visit.      Subjective Assessment - 06/19/17 1318    Subjective Javon reports significant increase in shoulder and elbow pain since last Friday. She does not know of any new activities of changes. She did receive a massage Friday which helped decrese tightness and pain. Better following treatment today.    Currently in Pain? Yes   Pain Score 5    Pain Location Shoulder   Pain Orientation Left   Pain Descriptors / Indicators Aching;Tightness   Pain Type Surgical pain   Pain Radiating Towards to Lt elbow    Pain Onset More than a month ago   Pain Frequency Intermittent                         [facility] Adult PT Treatment/Exercise - 06/19/17 0001      Shoulder Exercises: Supine   Other Supine Exercises scap squeeze 10 sec x 10      Moist Heat Therapy   Number  Minutes Moist Heat 20 Minutes   Moist Heat Location Shoulder  Lt shoulder girdle      Electrical Stimulation   Electrical Stimulation Location Lt shoudler to Lt distal arm toward elbow    Electrical Stimulation Action IFC   Electrical Stimulation Parameters to tolerance   Electrical Stimulation Goals Pain;Tone     Ultrasound   Ultrasound Location Lt pec/pec minor    Ultrasound Parameters !.2 w/cm2; 100% ; 1 mHz; 8 min    Ultrasound Goals Other (Comment);Pain  muscular tightness      Manual Therapy   Manual therapy comments pt supine    Joint Mobilization Lt shoulder circumduction    Soft tissue mobilization pecs; anterior deltoid; biceps; traps    Myofascial Release pecs; biceps    Scapular Mobilization Lt    Passive ROM Lt shoulder flexion; abduction in scaption; ER in scaption                      PT Long Term Goals - 06/19/17 1316      PT LONG TERM GOAL #1   Title Increase AROM Lt shoudler to equal  or greater than AROM Lt shoudler 07/18/17   Time 6   Period Weeks   Status On-going     PT LONG TERM GOAL #2   Title 4+/5 to 5/5 strength Lt shoudler 07/18/17   Time 6   Period Weeks   Status On-going     PT LONG TERM GOAL #3   Title Decrease pain to no more than 1-2/10 for all functional and work tasks 07/18/17   Time 6   Period Weeks   Status On-going     PT LONG TERM GOAL #4   Title Independent in HEP 07/18/17   Time 6   Period Weeks   Status On-going     PT LONG TERM GOAL #5   Title Improve FOTO to </= 37% limitation 07/18/17   Time 6   Period Weeks   Status On-going               Plan - 06/19/17 1317    Clinical Impression Statement Flare up of pain and discomfort - likely related to patient's activity level Responded well to modalities and manual work today. Patient will decrease frequency and reps of HEP and use ice and/or heat at home to address muscular tightness and pain.    Rehab Potential Good   PT Frequency 3x / week   PT  Duration 6 weeks   PT Treatment/Interventions Patient/family education;ADLs/Self Care Home Management;Neuromuscular re-education;Cryotherapy;Electrical Stimulation;Iontophoresis 4mg /ml Dexamethasone;Moist Heat;Ultrasound;Dry needling;Manual techniques;Therapeutic activities;Therapeutic exercise   PT Next Visit Plan assess response to modification of HEP; review HEP; work on posterior shoudler girdle engagement; progress with PROM; begin AROM gravity assisted; pulley; focus on posture and ROM initially    Consulted and Agree with Plan of Care Patient      Patient will benefit from skilled therapeutic intervention in order to improve the following deficits and impairments:  Postural dysfunction, Improper body mechanics, Pain, Increased fascial restricitons, Increased muscle spasms, Decreased mobility, Decreased range of motion, Decreased strength, Decreased activity tolerance  Visit Diagnosis: Acute pain of left shoulder  Other symptoms and signs involving the musculoskeletal system  Muscle weakness (generalized)     Problem List There are no active problems to display for this patient.   [place], MPH  06/19/2017, 1:23 PM  [facility] [place], [place], [address] Phone: [phone]   Fax:  [phone]  Name: Mary Freeman MRN: [id number] Date of Birth: 05/31/1951

## 2017-06-21 ENCOUNTER — Encounter: Payer: Self-pay | Admitting: Rehabilitative and Restorative Service Providers"

## 2017-06-21 ENCOUNTER — Ambulatory Visit (INDEPENDENT_AMBULATORY_CARE_PROVIDER_SITE_OTHER): Payer: 59 | Admitting: Rehabilitative and Restorative Service Providers"

## 2017-06-21 DIAGNOSIS — M6281 Muscle weakness (generalized): Secondary | ICD-10-CM | POA: Diagnosis not present

## 2017-06-21 DIAGNOSIS — M25512 Pain in left shoulder: Secondary | ICD-10-CM

## 2017-06-21 DIAGNOSIS — R29898 Other symptoms and signs involving the musculoskeletal system: Secondary | ICD-10-CM | POA: Diagnosis not present

## 2017-06-21 DIAGNOSIS — S46812D Strain of other muscles, fascia and tendons at shoulder and upper arm level, left arm, subsequent encounter: Secondary | ICD-10-CM | POA: Diagnosis not present

## 2017-06-21 NOTE — Therapy (Signed)
[place], [place], [address] Phone: [phone]   Fax:  [phone]  Physical Therapy Treatment  Patient Details  Name: Mary Freeman MRN: [id number] Date of Birth: 1951/03/21 Referring Provider: Dr Justice Britain   Encounter Date: 06/21/2017      PT End of Session - 06/21/17 1219    Visit Number 6   Number of Visits 18   Date for PT Re-Evaluation 07/18/17   PT Start Time 1103   PT Stop Time 1158   PT Time Calculation (min) 55 min   Activity Tolerance Patient tolerated treatment well      Past Medical History:  Diagnosis Date  . Depression   . Headache    migraines  . PONV (postoperative nausea and vomiting)     Past Surgical History:  Procedure Laterality Date  . APPENDECTOMY    . BREAST EXCISIONAL BIOPSY Right    benign  . COLONOSCOPY    . KNEE ARTHROSCOPY Right    x3  . KNEE ARTHROSCOPY W/ MENISCAL REPAIR Left   . LUMBAR LAMINECTOMY  1986  . REDUCTION MAMMAPLASTY Bilateral    2006  . ROTATOR CUFF REPAIR Right 2012  . TONSILLECTOMY    . TUBAL LIGATION      There were no vitals filed for this visit.      Subjective Assessment - 06/21/17 1220    Subjective Mary Freeman reports good relief of pain following last PT treatment but symptoms returned yesterday. She was seen by PA at ortho office this am and received a cortisone injection Lt shoudler for shoulder and elbow pain. PA advised that patient was OK to continue PT. Mary Freeman reports some continued pain today. She is apprehensive about therapy and does not want to irritate shoulder.    Currently in Pain? Yes   Pain Score 5    Pain Location Shoulder   Pain Orientation Left   Pain Descriptors / Indicators Aching;Tightness   Pain Type Surgical pain   Pain Radiating Towards to Lt elbow    Pain Onset More than a month ago   Pain Frequency Intermittent                         [facility] Adult PT Treatment/Exercise -  06/21/17 0001      Shoulder Exercises: Supine   Other Supine Exercises scap squeeze 10 sec x 10      Shoulder Exercises: Standing   Other Standing Exercises scap squeeze 10 sec x 10      Shoulder Exercises: Pulleys   Flexion --  10 sec x 10     Moist Heat Therapy   Number Minutes Moist Heat 20 Minutes   Moist Heat Location Shoulder  Lt shoulder girdle      Electrical Stimulation   Electrical Stimulation Location Lt shoudler to Lt distal arm toward elbow    Electrical Stimulation Action IFC   Electrical Stimulation Parameters to tolerance   Electrical Stimulation Goals Pain;Tone     Ultrasound   Ultrasound Location Lt pec musculature anterior shoudler    Ultrasound Parameters 1.2 w/cm2; 100%; 1 mHz; 8 min    Ultrasound Goals Other (Comment);Pain  muscular tightness      Manual Therapy   Manual therapy comments pt supine    Joint Mobilization Lt shoulder circumduction    Soft tissue mobilization pecs; anterior deltoid; biceps; traps    Myofascial Release pecs; biceps    Scapular Mobilization Lt  Passive ROM Lt shoulder flexion; abduction in scaption; ER in scaption                      PT Long Term Goals - 06/19/17 1316      PT LONG TERM GOAL #1   Title Increase AROM Lt shoudler to equal or greater than AROM Lt shoudler 07/18/17   Time 6   Period Weeks   Status On-going     PT LONG TERM GOAL #2   Title 4+/5 to 5/5 strength Lt shoudler 07/18/17   Time 6   Period Weeks   Status On-going     PT LONG TERM GOAL #3   Title Decrease pain to no more than 1-2/10 for all functional and work tasks 07/18/17   Time 6   Period Weeks   Status On-going     PT LONG TERM GOAL #4   Title Independent in HEP 07/18/17   Time 6   Period Weeks   Status On-going     PT LONG TERM GOAL #5   Title Improve FOTO to </= 37% limitation 07/18/17   Time 6   Period Weeks   Status On-going               Plan - 06/21/17 1226    Clinical Impression Statement  Continued flare up of Lt shoulder and elbow pain yesterday and today. Patient received injection in Lt shoulder this am. Will continue treatment consisting of manual work and modalities to decrease symptoms and pain. Educated patient on rest for shoulder continuing with gentle ROM through the full available range; work on posture and alignment; avoid using Rt or Lt UE's for repetitive tasks or strenous activities. Consider holding PT for a few days until pain subsides.    Rehab Potential Good   PT Frequency 3x / week   PT Duration 6 weeks   PT Treatment/Interventions Patient/family education;ADLs/Self Care Home Management;Neuromuscular re-education;Cryotherapy;Electrical Stimulation;Iontophoresis 4mg /ml Dexamethasone;Moist Heat;Ultrasound;Dry needling;Manual techniques;Therapeutic activities;Therapeutic exercise   PT Next Visit Plan assess response to modification of HEP; review HEP; work on posterior shoudler girdle engagement; progress with PROM; begin AROM gravity assisted; pulley; focus on posture and ROM initially    Consulted and Agree with Plan of Care Patient      Patient will benefit from skilled therapeutic intervention in order to improve the following deficits and impairments:  Postural dysfunction, Improper body mechanics, Pain, Increased fascial restricitons, Increased muscle spasms, Decreased mobility, Decreased range of motion, Decreased strength, Decreased activity tolerance  Visit Diagnosis: Acute pain of left shoulder  Other symptoms and signs involving the musculoskeletal system  Muscle weakness (generalized)     Problem List There are no active problems to display for this patient.   [place], MPH  06/21/2017, 12:31 PM  [facility] [place], [place], [address] Phone: [phone]   Fax:  [phone]  Name: Mary Freeman MRN: [id number] Date of Birth: July 03, 1951

## 2017-06-22 ENCOUNTER — Encounter: Payer: 59 | Admitting: Rehabilitative and Restorative Service Providers"

## 2017-06-26 ENCOUNTER — Encounter: Payer: 59 | Admitting: Physical Therapy

## 2017-06-28 ENCOUNTER — Encounter: Payer: Self-pay | Admitting: Rehabilitative and Restorative Service Providers"

## 2017-06-28 ENCOUNTER — Ambulatory Visit (INDEPENDENT_AMBULATORY_CARE_PROVIDER_SITE_OTHER): Payer: 59 | Admitting: Rehabilitative and Restorative Service Providers"

## 2017-06-28 DIAGNOSIS — M25512 Pain in left shoulder: Secondary | ICD-10-CM | POA: Diagnosis not present

## 2017-06-28 DIAGNOSIS — R29898 Other symptoms and signs involving the musculoskeletal system: Secondary | ICD-10-CM | POA: Diagnosis not present

## 2017-06-28 DIAGNOSIS — M6281 Muscle weakness (generalized): Secondary | ICD-10-CM | POA: Diagnosis not present

## 2017-06-28 NOTE — Patient Instructions (Signed)
Work on pulling shoulder blades down and back.   Standing or sitting - pull shoulder blades down and back - then move arms through small ranges while you hold the shoulder blades down and back Move forward and back; out to side; alternately reach with one have then the other   Standing with shoulder blades down and back hand resting on counter top or a piece of furniture Tighten shoulder blades the press into surface with palm Hold 5 sec repeat 5-10 times NO PAIN just feel muscles kicking in    Do not over do the exercises!!!

## 2017-06-28 NOTE — Therapy (Signed)
[place], [place], [address] Phone: [phone]   Fax:  [phone]  Physical Therapy Treatment  Patient Details  Name: Mary Freeman MRN: [id number] Date of Birth: [birth year] Referring Provider: Dr Justice Britain  Encounter Date: 06/28/2017      PT End of Session - 06/28/17 1102    Visit Number 7   Number of Visits 18   Date for PT Re-Evaluation 07/18/17   PT Start Time 1101   PT Stop Time 1157   PT Time Calculation (min) 56 min   Activity Tolerance Patient tolerated treatment well      Past Medical History:  Diagnosis Date  . Depression   . Headache    migraines  . PONV (postoperative nausea and vomiting)     Past Surgical History:  Procedure Laterality Date  . APPENDECTOMY    . BREAST EXCISIONAL BIOPSY Right    benign  . COLONOSCOPY    . KNEE ARTHROSCOPY Right    x3  . KNEE ARTHROSCOPY W/ MENISCAL REPAIR Left   . LUMBAR LAMINECTOMY  1986  . REDUCTION MAMMAPLASTY Bilateral    2006  . ROTATOR CUFF REPAIR Right 2012  . TONSILLECTOMY    . TUBAL LIGATION      There were no vitals filed for this visit.      Subjective Assessment - 06/28/17 1103    Subjective [name] reports that she is feeling much better.  The injection and the rest have helped.   Currently in Pain? No/denies            [facility] PT Assessment - 06/28/17 0001      Assessment   Medical Diagnosis Lt shoudler dysfunction    Referring Provider Dr Justice Britain   Onset Date/Surgical Date 05/25/17   Hand Dominance Right   Next MD Visit 06/30/17   Prior Therapy yes     AROM   Right Shoulder Extension 50 Degrees   Right Shoulder Flexion 152 Degrees   Right Shoulder ABduction 158 Degrees   Right Shoulder Internal Rotation 43 Degrees   Right Shoulder External Rotation 90 Degrees   Left Shoulder Extension 48 Degrees   Left Shoulder Flexion 142 Degrees   Left Shoulder ABduction 94 Degrees   Left Shoulder Internal  Rotation 24 Degrees   Left Shoulder External Rotation 67 Degrees     Palpation   Palpation comment tight Lt pecs; upper trap; leveator; teres; deltoid; biceps                      [facility] Adult PT Treatment/Exercise - 06/28/17 0001      Neuro Re-ed    Neuro Re-ed Details  postural correction working on engaging posterior shoudler girdle working on middle/lower trap contraction with serratus co-contraction      Shoulder Exercises: Supine   Other Supine Exercises scap squeeze 10 sec x 10    Other Supine Exercises working on UE movement with posterior shoulder girdle engaged with UE movements including flexion/ext; abd with elbow flexed; ER with elbows at side; alternate reaching fwd and to side 45 deg      Vasopneumatic   Number Minutes Vasopneumatic  15 minutes   Vasopnuematic Location  Shoulder   Vasopneumatic Pressure Low   Vasopneumatic Temperature  34 deg      Manual Therapy   Manual therapy comments pt supine    Joint Mobilization Lt shoulder circumduction    Soft tissue mobilization pecs; anterior deltoid;  biceps; traps    Myofascial Release pecs; biceps    Scapular Mobilization Lt    Passive ROM Lt shoulder flexion; abduction in scaption; ER in scaption                 PT Education - 06/28/17 1153    Education Details HEP   Person(s) Educated Patient   Methods Explanation;Demonstration;Tactile cues;Verbal cues;Handout   Comprehension Verbalized understanding;Returned demonstration;Verbal cues required;Tactile cues required             PT Long Term Goals - 06/28/17 1102      PT LONG TERM GOAL #1   Title Increase AROM Lt shoudler to equal or greater than AROM Lt shoudler 07/18/17   Time 6   Period Weeks   Status On-going     PT LONG TERM GOAL #2   Title 4+/5 to 5/5 strength Lt shoudler 07/18/17   Time 6   Period Weeks   Status On-going     PT LONG TERM GOAL #3   Title Decrease pain to no more than 1-2/10 for all functional and work  tasks 07/18/17   Time 6   Period Weeks   Status On-going     PT LONG TERM GOAL #4   Title Independent in HEP 07/18/17   Time 6   Period Weeks   Status On-going     PT LONG TERM GOAL #5   Title Improve FOTO to </= 37% limitation 07/18/17   Time 6   Period Weeks   Status On-going               Plan - 06/28/17 1145    Clinical Impression Statement Good improvement in shoulder pain following injection last week and rest with decreased frequency and reps of exercises. Worked well today to isolate posterior shoulder girdle contraction with inhibition of upper trap. Note continued muscular tightness through the pecs especially the pec minor; teres and upper trap. Cotninues to move functional in pattern of anterior impingement with head of the humerus moving forward. Worked to reverse this pattern and engage the posterior shoulder girdle musculature with good progress noted.    Rehab Potential Good   PT Frequency 3x / week   PT Duration 6 weeks   PT Treatment/Interventions Patient/family education;ADLs/Self Care Home Management;Neuromuscular re-education;Cryotherapy;Electrical Stimulation;Iontophoresis 4mg /ml Dexamethasone;Moist Heat;Ultrasound;Dry needling;Manual techniques;Therapeutic activities;Therapeutic exercise   PT Next Visit Plan continue modification of HEP; review HEP; work on posterior shoudler girdle engagement; progress with PROM; begin AROM gravity assisted; pulley; focus on posture and ROM initially    Consulted and Agree with Plan of Care Patient      Patient will benefit from skilled therapeutic intervention in order to improve the following deficits and impairments:  Postural dysfunction, Improper body mechanics, Pain, Increased fascial restricitons, Increased muscle spasms, Decreased mobility, Decreased range of motion, Decreased strength, Decreased activity tolerance  Visit Diagnosis: Acute pain of left shoulder  Other symptoms and signs involving the  musculoskeletal system  Muscle weakness (generalized)     Problem List There are no active problems to display for this patient.   [place], MPH  06/28/2017, 11:56 AM  [facility] [place], [place], [address] Phone: [phone]   Fax:  [phone]  Name: Mary Freeman MRN: [id number] Date of Birth: [birth year]

## 2017-06-30 ENCOUNTER — Encounter: Payer: Self-pay | Admitting: Rehabilitative and Restorative Service Providers"

## 2017-06-30 ENCOUNTER — Ambulatory Visit (INDEPENDENT_AMBULATORY_CARE_PROVIDER_SITE_OTHER): Payer: 59 | Admitting: Rehabilitative and Restorative Service Providers"

## 2017-06-30 DIAGNOSIS — M25512 Pain in left shoulder: Secondary | ICD-10-CM

## 2017-06-30 DIAGNOSIS — M6281 Muscle weakness (generalized): Secondary | ICD-10-CM | POA: Diagnosis not present

## 2017-06-30 DIAGNOSIS — R29898 Other symptoms and signs involving the musculoskeletal system: Secondary | ICD-10-CM

## 2017-06-30 NOTE — Patient Instructions (Signed)

## 2017-06-30 NOTE — Therapy (Signed)
[place], [place], [address] Phone: [phone]   Fax:  [phone]  Physical Therapy Treatment  Patient Details  Name: AVIENDHA AZBELL MRN: [id number] Date of Birth: 14-May-1951 Referring Provider: Dr Justice Britain  Encounter Date: 06/30/2017      PT End of Session - 06/30/17 1027    Visit Number 8   Number of Visits 18   Date for PT Re-Evaluation 07/18/17   PT Start Time 1019   PT Stop Time 1115   PT Time Calculation (min) 56 min   Activity Tolerance Patient tolerated treatment well      Past Medical History:  Diagnosis Date  . Depression   . Headache    migraines  . PONV (postoperative nausea and vomiting)     Past Surgical History:  Procedure Laterality Date  . APPENDECTOMY    . BREAST EXCISIONAL BIOPSY Right    benign  . COLONOSCOPY    . KNEE ARTHROSCOPY Right    x3  . KNEE ARTHROSCOPY W/ MENISCAL REPAIR Left   . LUMBAR LAMINECTOMY  1986  . REDUCTION MAMMAPLASTY Bilateral    2006  . ROTATOR CUFF REPAIR Right 2012  . TONSILLECTOMY    . TUBAL LIGATION      There were no vitals filed for this visit.      Subjective Assessment - 06/30/17 1028    Subjective Increased pain following therapy and with exercises at home. Discussed decreasing frequency of therapy and HEP to allow tissues to heal.    Currently in Pain? Yes   Pain Score 2    Pain Location Shoulder   Pain Orientation Left   Pain Descriptors / Indicators Aching;Tightness   Pain Type Surgical pain   Pain Onset More than a month ago   Pain Frequency Intermittent                         [facility] Adult PT Treatment/Exercise - 06/30/17 0001      Shoulder Exercises: Supine   Other Supine Exercises scap squeeze 10 sec x 10    Other Supine Exercises working on UE movement with posterior shoulder girdle engaged with UE movements including flexion/ext; abd with elbow flexed; ER with elbows at side; alternate  reaching fwd and to side 45 deg      Shoulder Exercises: Standing   Other Standing Exercises scap squeeze 10 sec x 10      Shoulder Exercises: Pulleys   Flexion --  10 sec x 10      Moist Heat Therapy   Number Minutes Moist Heat 20 Minutes   Moist Heat Location Shoulder  Lt      Electrical Stimulation   Electrical Stimulation Location Lt shoudler to Lt distal arm toward elbow    Electrical Stimulation Action IFC   Electrical Stimulation Parameters to tolerance   Electrical Stimulation Goals Pain;Tone     Ultrasound   Ultrasound Location Lt pecs; Lt biceps    Ultrasound Parameters 1.2 w/cm2; 100%; 1 mHz; 12 min      Manual Therapy   Manual therapy comments pt supine    Joint Mobilization Lt shoulder circumduction    Soft tissue mobilization pecs; anterior deltoid; biceps; traps    Myofascial Release pecs; biceps    Scapular Mobilization Lt    Passive ROM Lt shoulder flexion; abduction in scaption; ER in scaption  PT Education - 06/30/17 1134    Education provided Yes   Education Details exercise modification; taping    Person(s) Educated Patient   Methods Explanation;Demonstration;Tactile cues;Verbal cues;Handout   Comprehension Verbalized understanding             PT Long Term Goals - 06/28/17 1102      PT LONG TERM GOAL #1   Title Increase AROM Lt shoudler to equal or greater than AROM Lt shoudler 07/18/17   Time 6   Period Weeks   Status On-going     PT LONG TERM GOAL #2   Title 4+/5 to 5/5 strength Lt shoudler 07/18/17   Time 6   Period Weeks   Status On-going     PT LONG TERM GOAL #3   Title Decrease pain to no more than 1-2/10 for all functional and work tasks 07/18/17   Time 6   Period Weeks   Status On-going     PT LONG TERM GOAL #4   Title Independent in HEP 07/18/17   Time 6   Period Weeks   Status On-going     PT LONG TERM GOAL #5   Title Improve FOTO to </= 37% limitation 07/18/17   Time 6   Period Weeks    Status On-going               Plan - 06/30/17 1228    Clinical Impression Statement Persistent intermittent pain in anterior Lt shoulder. Patient has forward Lt shoulder posture which increases with functional activities and exercises. Very tight through the pecs and biceps. Good response to modalities and manual work. kinesotape to improve position of Lt scapula and inhibit Lt upper trap. ROM continues to be good. Returns to MD next week.    Rehab Potential Good   PT Frequency 3x / week   PT Duration 6 weeks   PT Treatment/Interventions Patient/family education;ADLs/Self Care Home Management;Neuromuscular re-education;Cryotherapy;Electrical Stimulation;Iontophoresis 4mg /ml Dexamethasone;Moist Heat;Ultrasound;Dry needling;Manual techniques;Therapeutic activities;Therapeutic exercise   PT Next Visit Plan continue modification of HEP; review HEP; work on posterior shoudler girdle engagement; progress with PROM; begin AROM gravity assisted; pulley; focus on posture and ROM initially    Consulted and Agree with Plan of Care Patient      Patient will benefit from skilled therapeutic intervention in order to improve the following deficits and impairments:  Postural dysfunction, Improper body mechanics, Pain, Increased fascial restricitons, Increased muscle spasms, Decreased mobility, Decreased range of motion, Decreased strength, Decreased activity tolerance  Visit Diagnosis: Acute pain of left shoulder  Other symptoms and signs involving the musculoskeletal system  Muscle weakness (generalized)     Problem List There are no active problems to display for this patient.   [place], MPH  06/30/2017, 12:36 PM  [facility] [place], [place], [address] Phone: [phone]   Fax:  [phone]  Name: TAHIRY SPICER MRN: [id number] Date of Birth: Oct 21, 1951

## 2017-07-03 ENCOUNTER — Encounter: Payer: 59 | Admitting: Rehabilitative and Restorative Service Providers"

## 2017-07-06 ENCOUNTER — Encounter: Payer: 59 | Admitting: Rehabilitative and Restorative Service Providers"

## 2017-07-18 MED FILL — PROGESTERONE 100 MG CAPSULE: 100 | 30 days supply | Qty: 30 | Fill #1

## 2017-07-18 MED FILL — ESTRADIOL 0.1 MG/GM CRM: 0.1 | 90 days supply | Qty: 43 | Fill #0

## 2017-07-19 DIAGNOSIS — M9903 Segmental and somatic dysfunction of lumbar region: Secondary | ICD-10-CM | POA: Diagnosis not present

## 2017-07-19 DIAGNOSIS — M9904 Segmental and somatic dysfunction of sacral region: Secondary | ICD-10-CM | POA: Diagnosis not present

## 2017-07-19 DIAGNOSIS — M6080 Other myositis, unspecified site: Secondary | ICD-10-CM | POA: Diagnosis not present

## 2017-07-19 DIAGNOSIS — M531 Cervicobrachial syndrome: Secondary | ICD-10-CM | POA: Diagnosis not present

## 2017-07-19 DIAGNOSIS — S99921S Unspecified injury of right foot, sequela: Secondary | ICD-10-CM | POA: Diagnosis not present

## 2017-07-19 DIAGNOSIS — M9901 Segmental and somatic dysfunction of cervical region: Secondary | ICD-10-CM | POA: Diagnosis not present

## 2017-07-19 DIAGNOSIS — M8580 Other specified disorders of bone density and structure, unspecified site: Secondary | ICD-10-CM | POA: Diagnosis not present

## 2017-07-19 DIAGNOSIS — M9902 Segmental and somatic dysfunction of thoracic region: Secondary | ICD-10-CM | POA: Diagnosis not present

## 2017-07-20 MED FILL — FLUoxetine HCL 20 MG CAPS: 20 | 90 days supply | Qty: 90 | Fill #0

## 2017-07-24 DIAGNOSIS — M9903 Segmental and somatic dysfunction of lumbar region: Secondary | ICD-10-CM | POA: Diagnosis not present

## 2017-07-24 DIAGNOSIS — M9904 Segmental and somatic dysfunction of sacral region: Secondary | ICD-10-CM | POA: Diagnosis not present

## 2017-07-24 DIAGNOSIS — M9901 Segmental and somatic dysfunction of cervical region: Secondary | ICD-10-CM | POA: Diagnosis not present

## 2017-07-24 DIAGNOSIS — M6080 Other myositis, unspecified site: Secondary | ICD-10-CM | POA: Diagnosis not present

## 2017-07-24 DIAGNOSIS — M9902 Segmental and somatic dysfunction of thoracic region: Secondary | ICD-10-CM | POA: Diagnosis not present

## 2017-07-24 DIAGNOSIS — M7542 Impingement syndrome of left shoulder: Secondary | ICD-10-CM | POA: Diagnosis not present

## 2017-07-24 DIAGNOSIS — M531 Cervicobrachial syndrome: Secondary | ICD-10-CM | POA: Diagnosis not present

## 2017-07-28 ENCOUNTER — Other Ambulatory Visit: Payer: Self-pay | Admitting: Orthopedic Surgery

## 2017-07-28 DIAGNOSIS — M542 Cervicalgia: Secondary | ICD-10-CM

## 2017-08-01 ENCOUNTER — Ambulatory Visit (HOSPITAL_COMMUNITY)
Admission: RE | Admit: 2017-08-01 | Discharge: 2017-08-01 | Disposition: A | Discharge status: Home / Self Care | Payer: 59 | Source: Ambulatory Visit | Attending: Orthopedic Surgery | Admitting: Orthopedic Surgery

## 2017-08-01 DIAGNOSIS — M50223 Other cervical disc displacement at C6-C7 level: Secondary | ICD-10-CM | POA: Diagnosis not present

## 2017-08-01 DIAGNOSIS — Z899 Acquired absence of limb, unspecified: Secondary | ICD-10-CM | POA: Insufficient documentation

## 2017-08-01 DIAGNOSIS — M542 Cervicalgia: Secondary | ICD-10-CM

## 2017-08-01 DIAGNOSIS — S46912D Strain of unspecified muscle, fascia and tendon at shoulder and upper arm level, left arm, subsequent encounter: Secondary | ICD-10-CM | POA: Diagnosis not present

## 2017-08-01 DIAGNOSIS — Z4781 Encounter for orthopedic aftercare following surgical amputation: Secondary | ICD-10-CM | POA: Diagnosis present

## 2017-08-01 DIAGNOSIS — M50222 Other cervical disc displacement at C5-C6 level: Secondary | ICD-10-CM | POA: Diagnosis not present

## 2017-08-03 ENCOUNTER — Ambulatory Visit: Payer: 59 | Admitting: Rehabilitative and Restorative Service Providers"

## 2017-08-04 ENCOUNTER — Ambulatory Visit (INDEPENDENT_AMBULATORY_CARE_PROVIDER_SITE_OTHER): Payer: 59 | Admitting: Rehabilitative and Restorative Service Providers"

## 2017-08-04 DIAGNOSIS — R29898 Other symptoms and signs involving the musculoskeletal system: Secondary | ICD-10-CM

## 2017-08-04 DIAGNOSIS — M542 Cervicalgia: Secondary | ICD-10-CM

## 2017-08-04 DIAGNOSIS — M9903 Segmental and somatic dysfunction of lumbar region: Secondary | ICD-10-CM | POA: Diagnosis not present

## 2017-08-04 DIAGNOSIS — M531 Cervicobrachial syndrome: Secondary | ICD-10-CM | POA: Diagnosis not present

## 2017-08-04 DIAGNOSIS — M9901 Segmental and somatic dysfunction of cervical region: Secondary | ICD-10-CM | POA: Diagnosis not present

## 2017-08-04 DIAGNOSIS — M9904 Segmental and somatic dysfunction of sacral region: Secondary | ICD-10-CM | POA: Diagnosis not present

## 2017-08-04 DIAGNOSIS — M6281 Muscle weakness (generalized): Secondary | ICD-10-CM

## 2017-08-04 DIAGNOSIS — M9902 Segmental and somatic dysfunction of thoracic region: Secondary | ICD-10-CM | POA: Diagnosis not present

## 2017-08-04 DIAGNOSIS — M6080 Other myositis, unspecified site: Secondary | ICD-10-CM | POA: Diagnosis not present

## 2017-08-04 NOTE — Patient Instructions (Signed)
Neurovascular: Median Nerve Stretch - Supine    Lie with neck supported, side-bent away from moving arm. Hold right arm out to side, elbow bent, thumb down, fingers and wrist bent back. Slowly straighten elbowas far as possible without pain. Hold for __60_ seconds. Repeat __2_ times per set. Do ___2 sessions per day  Scapula Adduction With Pectoralis Stretch: Low - Standing   Shoulders at 45 hands even with shoulders, keeping weight through legs, shift weight forward until you feel pull or stretch through the front of your chest. Hold _30__ seconds. Do _3__ times, _2-4__ times per day.   Scapula Adduction With Pectoralis Stretch: Mid-Range - Standing   Shoulders at 90 elbows even with shoulders, keeping weight through legs, shift weight forward until you feel pull or strength through the front of your chest. Hold __30_ seconds. Do _3__ times, __2-4_ times per day.   Scapula Adduction With Pectoralis Stretch: High - Standing   Shoulders at 120 hands up high on the doorway, keeping weight on feet, shift weight forward until you feel pull or stretch through the front of your chest. Hold _30__ seconds. Do _3__ times, _2-3__ times per day.   Shoulder Blade Squeeze    Rotate shoulders back, then squeeze shoulder blades down and back. Hold 10-20 sec Repeat __several__ times. Do __several__ sessions per day.   Pull left arm back with yellow band attached to doorway Keep shoulder blades down and back and hold throughout the entire exercise  Pause 3 sec 10 reps 1-2 sets 2 times/day   Trigger Point Dry Needling  . What is Trigger Point Dry Needling (DN)? o DN is a physical therapy technique used to treat muscle pain and dysfunction. Specifically, DN helps deactivate muscle trigger points (muscle knots).  o A thin filiform needle is used to penetrate the skin and stimulate the underlying trigger point. The goal is for a local twitch response (LTR) to occur and for the trigger point  to relax. No medication of any kind is injected during the procedure.   . What Does Trigger Point Dry Needling Feel Like?  o The procedure feels different for each individual patient. Some patients report that they do not actually feel the needle enter the skin and overall the process is not painful. Very mild bleeding may occur. However, many patients feel a deep cramping in the muscle in which the needle was inserted. This is the local twitch response.   Marland Kitchen How Will I feel after the treatment? o Soreness is normal, and the onset of soreness may not occur for a few hours. Typically this soreness does not last longer than two days.  o Bruising is uncommon, however; ice can be used to decrease any possible bruising.  o In rare cases feeling tired or nauseous after the treatment is normal. In addition, your symptoms may get worse before they get better, this period will typically not last longer than 24 hours.   . What Can I do After My Treatment? o Increase your hydration by drinking more water for the next 24 hours. o You may place ice or heat on the areas treated that have become sore, however, do not use heat on inflamed or bruised areas. Heat often brings more relief post needling. o You can continue your regular activities, but vigorous activity is not recommended initially after the treatment for 24 hours. o DN is best combined with other physical therapy such as strengthening, stretching, and other therapies.    Scapular dyskinesis

## 2017-08-04 NOTE — Therapy (Signed)
[facility] [address] [place], [place], [address] Phone: [phone]   Fax:  [phone]  Physical Therapy Evaluation  Patient Details  Name: Mary Freeman MRN: [id number] Date of Birth: 02/12/1951 Referring Provider: Dr Justice Britain   Encounter Date: 08/04/2017      PT End of Session - 08/04/17 1347    Visit Number 1   Number of Visits 12   Date for PT Re-Evaluation 09/15/17   PT Start Time 7124   PT Stop Time 1430   PT Time Calculation (min) 55 min   Activity Tolerance Patient tolerated treatment well      Past Medical History:  Diagnosis Date  . Depression   . Headache    migraines  . PONV (postoperative nausea and vomiting)     Past Surgical History:  Procedure Laterality Date  . APPENDECTOMY    . BREAST EXCISIONAL BIOPSY Right    benign  . COLONOSCOPY    . KNEE ARTHROSCOPY Right    x3  . KNEE ARTHROSCOPY W/ MENISCAL REPAIR Left   . LUMBAR LAMINECTOMY  1986  . REDUCTION MAMMAPLASTY Bilateral    2006  . ROTATOR CUFF REPAIR Right 2012  . TONSILLECTOMY    . TUBAL LIGATION      There were no vitals filed for this visit.       Subjective Assessment - 08/04/17 1348    Subjective Patient reports continued pain in the Lt neck and shoulder area which has not subsided. She underwent MRI for cervical spine which showed mild disc buldge C5/6 with no pressure on the nerve root. Patient was seen by her chiropractor today for manipulation of Lt rib. She has returned to work and notes increased pain with lifting patients and use of Lt UE for any overhead activities.    Pertinent History Injury 11/17 due to fall; surgery 05/25/17 for DCR/SAD/frayed RC/torn labrum    Diagnostic tests xrays MRI   Patient Stated Goals totally well - use Lt UE for functional and work activities    Currently in Pain? Yes   Pain Score 2    Pain Location Neck   Pain Orientation Left   Pain Descriptors / Indicators Dull;Nagging   Pain Type Chronic pain   Pain Radiating Towards down into elbow and arm    Pain Onset More than a month ago   Pain Frequency Intermittent   Aggravating Factors  lying on Lt side; lifting; using arm    Pain Relieving Factors meds; ice; avoid lifting             [facility] PT Assessment - 08/04/17 0001      Assessment   Medical Diagnosis Cervical dysfunction    Referring Provider Dr Lennette Bihari Supple    Onset Date/Surgical Date 05/25/17   Hand Dominance Right   Next MD Visit 08/21/17   Prior Therapy yes     Precautions   Precautions None     Balance Screen   Has the patient fallen in the past 6 months No   Has the patient had a decrease in activity level because of a fear of falling?  No   Is the patient reluctant to leave their home because of a fear of falling?  No     Prior Function   Level of Independence Independent   Vocation Full time employment   Vocation Requirements RN - hospital based    Leisure yard work; household chores      Observation/Other Assessments  Focus on Therapeutic Outcomes (FOTO)  50% limitation      Sensation   Additional Comments pain into the elbow; forearm'; thumb; index finger      Posture/Postural Control   Posture Comments head forward; shoulders rounded and elevated; scapulae abducted and rotated along the thoraicc wall      AROM   Right Shoulder Extension 52 Degrees   Right Shoulder Flexion 157 Degrees   Right Shoulder ABduction 161 Degrees   Right Shoulder Internal Rotation 44 Degrees   Right Shoulder External Rotation 98 Degrees   Left Shoulder Extension 49 Degrees   Left Shoulder Flexion 143 Degrees   Left Shoulder ABduction 134 Degrees   Left Shoulder Internal Rotation 26 Degrees   Left Shoulder External Rotation 128 Degrees   Cervical Flexion 65   Cervical Extension 54   Cervical - Right Side Bend 32   Cervical - Left Side Bend 23   Cervical - Right Rotation 62   Cervical - Left Rotation 51     Strength   Overall Strength  Comments Lt UE 4+/5 shd flexion; abd with pain; 5-/5 ER/IR; 5/5 ext - 5-/5 middle and lower traps bilat      Palpation   Spinal mobility tight CPA/lat glides mid cervical - muscular tightness noted Lt SCM/scaleni/trap    Palpation comment tight Lt pecs; upper trap; leveator; teres; deltoid; biceps             Objective measurements completed on examination: See above findings.          [facility] Adult PT Treatment/Exercise - 08/04/17 0001      Neuro Re-ed    Neuro Re-ed Details  postural correction working on engaging posterior shoudler girdle working on middle/lower trap contraction with serratus co-contraction      Shoulder Exercises: Standing   Extension Strengthening;Left;10 reps  focus on posterior shd girdle contraction with shd ext    Theraband Level (Shoulder Extension) Level 1 (Yellow)   Other Standing Exercises scap squeeze 10 sec x 10      Shoulder Exercises: Stretch   Other Shoulder Stretches neural stretch supine 60 sec x 2    Other Shoulder Stretches 3 way doorway 30 sec x 2 each      Moist Heat Therapy   Number Minutes Moist Heat 20 Minutes   Moist Heat Location --  abdomen     Electrical Stimulation   Electrical Stimulation Location Lt cervical and shoulder area    Electrical Stimulation Action IFC    Electrical Stimulation Parameters to tolerance   Electrical Stimulation Goals Pain;Tone     Manual Therapy   Manual therapy comments pt supine    Joint Mobilization inferior glide; posterior glide   Soft tissue mobilization Lt ant/lat cervical pecs; anterior deltoid; biceps; traps    Myofascial Release cervical to upper traps    Scapular Mobilization Lt   Passive ROM Lt shoulder flexion; abduction in scaption; ER in scaption    Kinesiotex --  ROC tape for scapular facilitation bilat;inhabit trap bilat                 PT Education - 08/04/17 1444    Education provided Yes   Education Details HEP    Person(s) Educated Patient   Methods  Explanation;Demonstration;Tactile cues;Verbal cues;Handout   Comprehension Verbalized understanding;Returned demonstration;Verbal cues required;Tactile cues required          PT Short Term Goals - 08/04/17 1508      PT SHORT TERM GOAL #1  Title Increase AROM cervical spine to WFL's with rotation and lateral flexion ~equal bilat 09/01/17   Time 4   Period Weeks   Status New     PT SHORT TERM GOAL #2   Title Decrease radicular symptoms Lt UE by 50-75% with functional activities 09/01/17   Time 4   Period Weeks   Status New           PT Long Term Goals - 08/04/17 1506      PT LONG TERM GOAL #1   Title Increase AROM Lt shoudler to equal or greater than AROM Lt shoudler 09/15/17   Time 6   Period Weeks   Status New     PT LONG TERM GOAL #2   Title 4+/5 to 5/5 strength Lt shoudler and posterior shoudler girdle 09/15/17   Time 6   Period Weeks   Status New     PT LONG TERM GOAL #3   Title Decrease pain to no more than 1-2/10 for all functional and work tasks 09/15/17   Time 6   Period Weeks   Status New     PT LONG TERM GOAL #4   Title Independent in HEP 09/15/17   Time 6   Period Weeks   Status New     PT LONG TERM GOAL #5   Title Improve FOTO to </= 36% limitation 09/15/17   Time 6   Period Weeks   Status New                Plan - 08/04/17 1451    Clinical Impression Statement Mary Freeman returns with persistent intermittent pain inthe Lt cervical and shoulder girdle area as well as intermittent pain into the Lt forearm and thumb/index finger. She notices that symptoms are increased significantly with lifting and Lt UE use at work. She presents with poor cervical and thoracic posture and alignment; scapular dyskinesis; limited cervical and Lt shoulder ROM; muscular tightness to palpation; and pain on a daily basis. She will benefit from PT to address cervical and shoudler dysfunction.    Clinical Presentation Evolving   Clinical Decision Making Low   Rehab  Potential Good   PT Frequency 2x / week   PT Duration 6 weeks   PT Treatment/Interventions Patient/family education;ADLs/Self Care Home Management;Neuromuscular re-education;Cryotherapy;Electrical Stimulation;Iontophoresis 4mg /ml Dexamethasone;Moist Heat;Ultrasound;Dry needling;Manual techniques;Therapeutic activities;Therapeutic exercise   PT Next Visit Plan continue modification of HEP; review HEP; work on posterior shoudler girdle engagement; progress with PROM; focus on posture and ROM with posterior shoulder engaged; assess response to tape    Consulted and Agree with Plan of Care Patient      Patient will benefit from skilled therapeutic intervention in order to improve the following deficits and impairments:  Postural dysfunction, Improper body mechanics, Pain, Increased fascial restricitons, Increased muscle spasms, Decreased mobility, Decreased range of motion, Decreased strength, Decreased activity tolerance  Visit Diagnosis: Cervicalgia  Other symptoms and signs involving the musculoskeletal system  Muscle weakness (generalized)     Problem List There are no active problems to display for this patient.   [place], MPH  08/04/2017, 3:13 PM  [facility] [place], [place], [address] Phone: [phone]   Fax:  [phone]  Name: Mary Freeman MRN: [id number] Date of Birth: [birth year]

## 2017-08-07 ENCOUNTER — Encounter: Payer: Self-pay | Admitting: Rehabilitative and Restorative Service Providers"

## 2017-08-07 ENCOUNTER — Ambulatory Visit (INDEPENDENT_AMBULATORY_CARE_PROVIDER_SITE_OTHER): Payer: 59 | Admitting: Rehabilitative and Restorative Service Providers"

## 2017-08-07 DIAGNOSIS — M6281 Muscle weakness (generalized): Secondary | ICD-10-CM

## 2017-08-07 DIAGNOSIS — M542 Cervicalgia: Secondary | ICD-10-CM

## 2017-08-07 DIAGNOSIS — M25512 Pain in left shoulder: Secondary | ICD-10-CM | POA: Diagnosis not present

## 2017-08-07 DIAGNOSIS — R29898 Other symptoms and signs involving the musculoskeletal system: Secondary | ICD-10-CM

## 2017-08-07 NOTE — Patient Instructions (Addendum)
Add arms at side palms up bring hands out to side squeezing shoulder blades down and back  Hold 5 sec 10 reps 2-3 sets of 10   Scapular Retraction: Elbow Flexion (Standing)    With elbows bent to 90, pinch shoulder blades together and rotate arms out, keeping elbows bent. Repeat _10___ times per set. Do __1-2__ sets per session. Do __2-3__ sessions per day.   Triceps, Overhead    Reach one arm over head and other behind back. Clasp hands, if possible. Use a belt or small towel if hands cannot clasp. Hold _30__ seconds. Repeat _3__ times per session. Do _2-3__ sessions per day.    Triceps, Overhead II    Bring one arm over head and bend elbow as far as possible. Grasp wrist with other hand and gently stretch further. Hold _30__ seconds. Repeat _3__ times per session. Do __2-3_ sessions per day.

## 2017-08-07 NOTE — Therapy (Signed)
[place], [place], [address] Phone: [phone]   Fax:  [phone]  Physical Therapy Treatment  Patient Details  Name: Mary Freeman MRN: [id number] Date of Birth: Feb 15, 1951 Referring Provider: Dr Justice Britain   Encounter Date: 08/07/2017      PT End of Session - 08/07/17 1104    Visit Number 2   Number of Visits 12   Date for PT Re-Evaluation 09/15/17   PT Start Time 1102   PT Stop Time 1157   PT Time Calculation (min) 55 min   Activity Tolerance Patient tolerated treatment well      Past Medical History:  Diagnosis Date  . Depression   . Headache    migraines  . PONV (postoperative nausea and vomiting)     Past Surgical History:  Procedure Laterality Date  . APPENDECTOMY    . BREAST EXCISIONAL BIOPSY Right    benign  . COLONOSCOPY    . KNEE ARTHROSCOPY Right    x3  . KNEE ARTHROSCOPY W/ MENISCAL REPAIR Left   . LUMBAR LAMINECTOMY  1986  . REDUCTION MAMMAPLASTY Bilateral    2006  . ROTATOR CUFF REPAIR Right 2012  . TONSILLECTOMY    . TUBAL LIGATION      There were no vitals filed for this visit.      Subjective Assessment - 08/07/17 1105    Subjective Some improvement in shoulder pain. Doing exercises. Taping helps. Long days at work which irritate symptoms.    Currently in Pain? Yes                         [facility] Adult PT Treatment/Exercise - 08/07/17 0001      Neuro Re-ed    Neuro Re-ed Details  postural correction working on engaging posterior shoudler girdle working on middle/lower trap contraction with serratus co-contraction      Shoulder Exercises: Supine   Other Supine Exercises scap squeeze 10 sec x 10    Other Supine Exercises working on UE movement with posterior shoulder girdle engaged with UE movements including flexion/ext; abd with elbow flexed; ER with elbows at side; alternate reaching fwd and to side 45 deg      Shoulder Exercises:  Prone   Retraction Strengthening;Both;10 reps   Other Prone Exercises partial row 3 sec x 10 (more difficulty with scap positions - will not add to HEP      Shoulder Exercises: Standing   Extension Strengthening;Left;10 reps  focus on posterior shd girdle contraction with shd ext    Theraband Level (Shoulder Extension) Level 1 (Yellow)   Retraction Strengthening;Both;5 reps  neuromuscular control of posterior shd girdle    Other Standing Exercises scap squeeze 10 sec x 10    Other Standing Exercises W's 3 sec x 10 focus on posterior shd girdle      Shoulder Exercises: Stretch   Other Shoulder Stretches neural stretch supine 60 sec x 2    Other Shoulder Stretches 3 way doorway 30 sec x 2 each      Moist Heat Therapy   Number Minutes Moist Heat 20 Minutes   Moist Heat Location --  abdomen     Electrical Stimulation   Electrical Stimulation Location Lt cervical and shoulder area    Electrical Stimulation Action IFC   Electrical Stimulation Parameters to tolerance   Electrical Stimulation Goals Pain;Tone     Manual Therapy   Manual therapy comments pt supine  Joint Mobilization inferior glide; posterior glide   Soft tissue mobilization Lt ant/lat cervical pecs; anterior deltoid; biceps; traps    Myofascial Release cervical to upper traps    Scapular Mobilization Lt   Passive ROM Lt shoulder flexion; abduction in scaption; ER in scaption    Kinesiotex --  ROC tape for scapular facilitation bilat;inhabit trap bilat                 PT Education - 08/07/17 1136    Education provided Yes   Education Details HEP    Person(s) Educated Patient   Methods Explanation;Demonstration;Tactile cues;Verbal cues;Handout   Comprehension Verbalized understanding;Returned demonstration;Verbal cues required;Tactile cues required          PT Short Term Goals - 08/07/17 1247      PT SHORT TERM GOAL #1   Title Increase AROM cervical spine to WFL's with rotation and lateral flexion  ~equal bilat 09/01/17   Time 4   Period Weeks   Status On-going     PT SHORT TERM GOAL #2   Title Decrease radicular symptoms Lt UE by 50-75% with functional activities 09/01/17   Time 4   Period Weeks   Status On-going           PT Long Term Goals - 08/07/17 1105      PT LONG TERM GOAL #1   Title Increase AROM Lt shoudler to equal or greater than AROM Lt shoudler 09/15/17   Time 6   Period Weeks   Status On-going     PT LONG TERM GOAL #2   Title 4+/5 to 5/5 strength Lt shoudler and posterior shoudler girdle 09/15/17   Time 6   Period Weeks   Status On-going     PT LONG TERM GOAL #3   Title Decrease pain to no more than 1-2/10 for all functional and work tasks 09/15/17   Time 6   Period Weeks   Status On-going     PT LONG TERM GOAL #4   Title Independent in HEP 09/15/17   Time 6   Period Weeks   Status On-going     PT LONG TERM GOAL #5   Title Improve FOTO to </= 36% limitation 09/15/17   Time 6   Period Weeks   Status On-going               Plan - 08/07/17 1248    Clinical Impression Statement Vallorie reports good improvement with therapy including manual work and stretching as well as exercise and especially postural work at home and her work. Added exercise for postural correction with patient demonstrating good contraction of mid and lower trap. Note increasing PROM Lt GH joint and less palpable tightness anterior shoulder.    Rehab Potential Good   PT Frequency 2x / week   PT Duration 6 weeks   PT Treatment/Interventions Patient/family education;ADLs/Self Care Home Management;Neuromuscular re-education;Cryotherapy;Electrical Stimulation;Iontophoresis 4mg /ml Dexamethasone;Moist Heat;Ultrasound;Dry needling;Manual techniques;Therapeutic activities;Therapeutic exercise   PT Next Visit Plan continue modification of HEP; review HEP; work on posterior shoudler girdle engagement; progress with PROM; focus on posture and ROM with posterior shoulder engaged;  continue taping as indicated; manual work    Oncologist with Plan of Care Patient      Patient will benefit from skilled therapeutic intervention in order to improve the following deficits and impairments:  Postural dysfunction, Improper body mechanics, Pain, Increased fascial restricitons, Increased muscle spasms, Decreased mobility, Decreased range of motion, Decreased strength, Decreased activity tolerance  Visit  Diagnosis: Cervicalgia  Other symptoms and signs involving the musculoskeletal system  Muscle weakness (generalized)  Acute pain of left shoulder     Problem List There are no active problems to display for this patient.   [place], MPH  08/07/2017, 12:55 PM  [facility] [place], [place], [address] Phone: [phone]   Fax:  [phone]  Name: CYANN VENTI MRN: [id number] Date of Birth: 07-01-1951

## 2017-08-09 ENCOUNTER — Encounter: Payer: Self-pay | Admitting: Rehabilitative and Restorative Service Providers"

## 2017-08-09 ENCOUNTER — Ambulatory Visit (INDEPENDENT_AMBULATORY_CARE_PROVIDER_SITE_OTHER): Payer: 59 | Admitting: Rehabilitative and Restorative Service Providers"

## 2017-08-09 DIAGNOSIS — M25512 Pain in left shoulder: Secondary | ICD-10-CM

## 2017-08-09 DIAGNOSIS — M6281 Muscle weakness (generalized): Secondary | ICD-10-CM

## 2017-08-09 DIAGNOSIS — R29898 Other symptoms and signs involving the musculoskeletal system: Secondary | ICD-10-CM

## 2017-08-09 DIAGNOSIS — M542 Cervicalgia: Secondary | ICD-10-CM

## 2017-08-09 NOTE — Therapy (Signed)
[place], [place], [address] Phone: [phone]   Fax:  [phone]  Physical Therapy Treatment  Patient Details  Name: Mary Freeman MRN: [id number] Date of Birth: 1951/02/01 Referring Provider: Dr Justice Britain   Encounter Date: 08/09/2017      PT End of Session - 08/09/17 1402    Visit Number 3   Number of Visits 12   Date for PT Re-Evaluation 09/15/17   PT Start Time 1400   PT Stop Time 1446   PT Time Calculation (min) 46 min   Activity Tolerance Patient tolerated treatment well      Past Medical History:  Diagnosis Date  . Depression   . Headache    migraines  . PONV (postoperative nausea and vomiting)     Past Surgical History:  Procedure Laterality Date  . APPENDECTOMY    . BREAST EXCISIONAL BIOPSY Right    benign  . COLONOSCOPY    . KNEE ARTHROSCOPY Right    x3  . KNEE ARTHROSCOPY W/ MENISCAL REPAIR Left   . LUMBAR LAMINECTOMY  1986  . REDUCTION MAMMAPLASTY Bilateral    2006  . ROTATOR CUFF REPAIR Right 2012  . TONSILLECTOMY    . TUBAL LIGATION      There were no vitals filed for this visit.      Subjective Assessment - 08/09/17 1401    Subjective Continued improvenment. No biceps pain - just a little stiff. Worked 13 hours and did [place]. Slept 11 hours last night.    Currently in Pain? No/denies                         [facility] Adult PT Treatment/Exercise - 08/09/17 0001      Shoulder Exercises: Standing   Extension Strengthening;Left;10 reps  focus on posterior shd girdle contraction with shd ext    Theraband Level (Shoulder Extension) Level 1 (Yellow)   Retraction Strengthening;Both;5 reps  neuromuscular control of posterior shd girdle    Other Standing Exercises scap squeeze 10 sec x 10    Other Standing Exercises W's 3 sec x 10 focus on posterior shd girdle; reaching for floor for scap depression 5 sec x 10       Shoulder Exercises:  Isometric Strengthening   External Rotation Theraband  3 sec hold x 10 reps    Theraband Level (External Rotation) Level 3 (Green)     Shoulder Exercises: Stretch   Other Shoulder Stretches neural stretch supine 60 sec x 2    Other Shoulder Stretches 3 way doorway 30 sec x 2 each      Moist Heat Therapy   Number Minutes Moist Heat 20 Minutes   Moist Heat Location --  abdomen     Electrical Stimulation   Electrical Stimulation Location Lt cervical and shoulder area    Electrical Stimulation Action IFC   Electrical Stimulation Parameters to tolerance   Electrical Stimulation Goals Pain;Tone     Manual Therapy   Manual therapy comments pt supine    Joint Mobilization inferior glide; posterior glide   Soft tissue mobilization Lt ant/lat cervical pecs; anterior deltoid; biceps; traps    Myofascial Release cervical to upper traps    Scapular Mobilization Lt   Passive ROM Lt shoulder flexion; abduction in scaption; ER in scaption    Kinesiotex --  ROC tape for scapular facilitation bilat;inhabit trap bilat  PT Short Term Goals - 08/07/17 1247      PT SHORT TERM GOAL #1   Title Increase AROM cervical spine to WFL's with rotation and lateral flexion ~equal bilat 09/01/17   Time 4   Period Weeks   Status On-going     PT SHORT TERM GOAL #2   Title Decrease radicular symptoms Lt UE by 50-75% with functional activities 09/01/17   Time 4   Period Weeks   Status On-going           PT Long Term Goals - 08/07/17 1105      PT LONG TERM GOAL #1   Title Increase AROM Lt shoudler to equal or greater than AROM Lt shoudler 09/15/17   Time 6   Period Weeks   Status On-going     PT LONG TERM GOAL #2   Title 4+/5 to 5/5 strength Lt shoudler and posterior shoudler girdle 09/15/17   Time 6   Period Weeks   Status On-going     PT LONG TERM GOAL #3   Title Decrease pain to no more than 1-2/10 for all functional and work tasks 09/15/17   Time 6   Period  Weeks   Status On-going     PT LONG TERM GOAL #4   Title Independent in HEP 09/15/17   Time 6   Period Weeks   Status On-going     PT LONG TERM GOAL #5   Title Improve FOTO to </= 36% limitation 09/15/17   Time 6   Period Weeks   Status On-going               Plan - 08/09/17 1403    Clinical Impression Statement Good improvement in shoulder and arm pain. Good progress with posture and alignment. Progressing with posterior girdle strength; Excellent progress!!   Rehab Potential Good   PT Frequency 2x / week   PT Duration 6 weeks   PT Next Visit Plan continue modification of HEP; review HEP; work on posterior shoudler girdle engagement; progress with PROM; focus on posture and ROM with posterior shoulder engaged; continue taping as indicated; manual work    Oncologist with Plan of Care Patient      Patient will benefit from skilled therapeutic intervention in order to improve the following deficits and impairments:  Postural dysfunction, Improper body mechanics, Pain, Increased fascial restricitons, Increased muscle spasms, Decreased mobility, Decreased range of motion, Decreased strength, Decreased activity tolerance  Visit Diagnosis: Cervicalgia  Other symptoms and signs involving the musculoskeletal system  Muscle weakness (generalized)  Acute pain of left shoulder     Problem List There are no active problems to display for this patient.   [place], MPH  08/09/2017, 2:29 PM  [facility] [place], [place], [address] Phone: [phone]   Fax:  [phone]  Name: Mary Freeman MRN: [id number] Date of Birth: 1951-01-23

## 2017-08-14 ENCOUNTER — Ambulatory Visit (INDEPENDENT_AMBULATORY_CARE_PROVIDER_SITE_OTHER): Payer: 59 | Admitting: Rehabilitative and Restorative Service Providers"

## 2017-08-14 ENCOUNTER — Encounter: Payer: Self-pay | Admitting: Rehabilitative and Restorative Service Providers"

## 2017-08-14 DIAGNOSIS — M25512 Pain in left shoulder: Secondary | ICD-10-CM | POA: Diagnosis not present

## 2017-08-14 DIAGNOSIS — M6281 Muscle weakness (generalized): Secondary | ICD-10-CM

## 2017-08-14 DIAGNOSIS — M542 Cervicalgia: Secondary | ICD-10-CM | POA: Diagnosis not present

## 2017-08-14 DIAGNOSIS — R29898 Other symptoms and signs involving the musculoskeletal system: Secondary | ICD-10-CM

## 2017-08-14 NOTE — Therapy (Signed)
[place], [place], [address] Phone: [phone]   Fax:  [phone]  Physical Therapy Treatment  Patient Details  Name: Mary Freeman MRN: [id number] Date of Birth: [birth year] Referring Provider: Dr Justice Britain   Encounter Date: 08/14/2017      PT End of Session - 08/14/17 1150    Visit Number 4   Number of Visits 12   Date for PT Re-Evaluation 09/15/17   PT Start Time 5038   PT Stop Time 1245   PT Time Calculation (min) 57 min   Activity Tolerance Patient tolerated treatment well      Past Medical History:  Diagnosis Date  . Depression   . Headache    migraines  . PONV (postoperative nausea and vomiting)     Past Surgical History:  Procedure Laterality Date  . APPENDECTOMY    . BREAST EXCISIONAL BIOPSY Right    benign  . COLONOSCOPY    . KNEE ARTHROSCOPY Right    x3  . KNEE ARTHROSCOPY W/ MENISCAL REPAIR Left   . LUMBAR LAMINECTOMY  1986  . REDUCTION MAMMAPLASTY Bilateral    2006  . ROTATOR CUFF REPAIR Right 2012  . TONSILLECTOMY    . TUBAL LIGATION      There were no vitals filed for this visit.      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 She has had some continued discomfort in the shoudler since then.    Currently in Pain? Yes   Pain Score 4    Pain Location Shoulder   Pain Orientation Left   Pain Descriptors / Indicators Sore;Nagging;Aching   Pain Type Acute pain   Pain Frequency Intermittent            [facility] PT Assessment - 08/14/17 0001      Assessment   Medical Diagnosis Cervical dysfunction    Referring Provider Dr Justice Britain    Onset Date/Surgical Date 05/25/17   Hand Dominance Right   Next MD Visit 08/21/17   Prior Therapy yes     Sensation   Additional Comments soreness into the elbow; forearm'; none in thumb; index finger       Posture/Postural Control   Posture Comments improving postue and alignment with increased posterior shoudler girdle control      AROM   Left Shoulder Extension 50 Degrees   Left Shoulder Flexion 146 Degrees   Left Shoulder ABduction 150 Degrees                     [facility] Adult PT Treatment/Exercise - 08/14/17 0001      Neuro Re-ed    Neuro Re-ed Details  postural correction working on engaging posterior shoudler girdle working on middle/lower trap contraction with serratus co-contraction      Shoulder Exercises: Standing   Other Standing Exercises scap squeeze 10 sec x 10    Other Standing Exercises W's 3 sec x 10 focus on posterior shd girdle; reaching for floor for scap depression 5 sec x 10       Shoulder Exercises: Stretch   Other Shoulder Stretches neural stretch supine 60 sec x 2    Other Shoulder Stretches 3 way doorway 30 sec x 2 each      Moist Heat Therapy   Number Minutes Moist Heat 20 Minutes   Moist  Heat Location --  abdomen     Electrical Stimulation   Electrical Stimulation Location Lt cervical and shoulder area    Electrical Stimulation Action IFC   Electrical Stimulation Parameters to tolerance   Electrical Stimulation Goals Pain;Tone     Ultrasound   Ultrasound Location Lt anterior shoulder and biceps tendon area    Ultrasound Parameters 1.5 w/cm2; 1 MHz; 100% limitation; 8 min    Ultrasound Goals Pain;Other (Comment)  tightness      Manual Therapy   Manual Therapy --  focus on tightness Lt pecs and biceps/anterior deltoid    Manual therapy comments pt supine    Joint Mobilization inferior glide; posterior glide   Soft tissue mobilization Lt ant/lat cervical pecs; anterior deltoid; biceps; traps    Myofascial Release cervical to upper traps                   PT Short Term Goals - 08/07/17 1247      PT SHORT TERM GOAL #1   Title Increase AROM cervical spine to WFL's with rotation and lateral flexion ~equal bilat 09/01/17    Time 4   Period Weeks   Status On-going     PT SHORT TERM GOAL #2   Title Decrease radicular symptoms Lt UE by 50-75% with functional activities 09/01/17   Time 4   Period Weeks   Status On-going           PT Long Term Goals - 08/14/17 1239      PT LONG TERM GOAL #1   Title Increase AROM Lt shoudler to equal or greater than AROM Lt shoudler 09/15/17   Time 6   Period Weeks   Status Partially Met  good gains in AROM Lt shoulder      PT LONG TERM GOAL #2   Title 4+/5 to 5/5 strength Lt shoudler and posterior shoudler girdle 09/15/17   Time 6   Period Weeks   Status On-going     PT LONG TERM GOAL #3   Title Decrease pain to no more than 1-2/10 for all functional and work tasks 09/15/17   Time 6   Period Weeks   Status On-going     PT LONG TERM GOAL #4   Title Independent in HEP 09/15/17   Time 6   Period Weeks   Status On-going     PT LONG TERM GOAL #5   Title Improve FOTO to </= 36% limitation 09/15/17   Time 6   Period Weeks   Status On-going               Plan - 08/14/17 1237    Clinical Impression Statement Flare up of anterior shoulder pain related to active shoulder flexion mopping 08/12/17. Note increased muscular tightness through the Lt pecs; anterior deltoid and biceps. Good response to stretching; manual work and modalities today.    Rehab Potential Good   PT Frequency 2x / week   PT Duration 6 weeks   PT Treatment/Interventions Patient/family education;ADLs/Self Care Home Management;Neuromuscular re-education;Cryotherapy;Electrical Stimulation;Iontophoresis 20m/ml Dexamethasone;Moist Heat;Ultrasound;Dry needling;Manual techniques;Therapeutic activities;Therapeutic exercise   PT Next Visit Plan continue modification of HEP; review HEP; work on posterior shoudler girdle engagement; progress with PROM; focus on posture and ROM with posterior shoulder engaged; continue taping as indicated; manual work    COncologistwith Plan of Care  Patient      Patient will benefit from skilled therapeutic intervention in order to improve the following deficits and impairments:  Postural dysfunction, Improper body mechanics, Pain, Increased fascial restricitons, Increased muscle spasms, Decreased mobility, Decreased range of motion, Decreased strength, Decreased activity tolerance  Visit Diagnosis: Cervicalgia  Other symptoms and signs involving the musculoskeletal system  Muscle weakness (generalized)  Acute pain of left shoulder     Problem List There are no active problems to display for this patient.   [place], MPH  08/14/2017, 12:40 PM  [facility] [place], [place], [address] Phone: [phone]   Fax:  [phone]  Name: JALEIA HANKE MRN: [id number] Date of Birth: 1951/06/06

## 2017-08-17 ENCOUNTER — Ambulatory Visit (INDEPENDENT_AMBULATORY_CARE_PROVIDER_SITE_OTHER): Payer: 59 | Admitting: Rehabilitative and Restorative Service Providers"

## 2017-08-17 ENCOUNTER — Encounter: Payer: Self-pay | Admitting: Rehabilitative and Restorative Service Providers"

## 2017-08-17 DIAGNOSIS — R29898 Other symptoms and signs involving the musculoskeletal system: Secondary | ICD-10-CM | POA: Diagnosis not present

## 2017-08-17 DIAGNOSIS — M6281 Muscle weakness (generalized): Secondary | ICD-10-CM

## 2017-08-17 DIAGNOSIS — H5213 Myopia, bilateral: Secondary | ICD-10-CM | POA: Diagnosis not present

## 2017-08-17 DIAGNOSIS — M25512 Pain in left shoulder: Secondary | ICD-10-CM

## 2017-08-17 DIAGNOSIS — H34232 Retinal artery branch occlusion, left eye: Secondary | ICD-10-CM | POA: Diagnosis not present

## 2017-08-17 DIAGNOSIS — M542 Cervicalgia: Secondary | ICD-10-CM | POA: Diagnosis not present

## 2017-08-17 NOTE — Therapy (Signed)
[facility] [place], [place], [address] Phone: [phone]   Fax:  [phone]  Physical Therapy Treatment  Patient Details  Name: Mary Freeman MRN: [id number] Date of Birth: [birth year] Referring Provider: Dr Justice Britain   Encounter Date: 08/17/2017      PT End of Session - 08/17/17 1546    Visit Number 5   Number of Visits 12   Date for PT Re-Evaluation 09/15/17   PT Start Time 1532   PT Stop Time 1628   PT Time Calculation (min) 56 min   Activity Tolerance Patient tolerated treatment well      Past Medical History:  Diagnosis Date  . Depression   . Headache    migraines  . PONV (postoperative nausea and vomiting)     Past Surgical History:  Procedure Laterality Date  . APPENDECTOMY    . BREAST EXCISIONAL BIOPSY Right    benign  . COLONOSCOPY    . KNEE ARTHROSCOPY Right    x3  . KNEE ARTHROSCOPY W/ MENISCAL REPAIR Left   . LUMBAR LAMINECTOMY  1986  . REDUCTION MAMMAPLASTY Bilateral    2006  . ROTATOR CUFF REPAIR Right 2012  . TONSILLECTOMY    . TUBAL LIGATION      There were no vitals filed for this visit.      Subjective Assessment - 08/17/17 1544    Subjective Great day after therapy. then worked over 12 hours yesterday and has had increased pain since then. Tight and hurting into the biceps. Pain wias about 5/10 this morning.    Currently in Pain? No/denies                         [facility] Adult PT Treatment/Exercise - 08/17/17 0001      Shoulder Exercises: Standing   Extension Strengthening;Left;10 reps   Theraband Level (Shoulder Extension) Level 1 (Yellow)   Retraction Strengthening;Both;5 reps   Other Standing Exercises scap squeeze 10 sec x 10    Other Standing Exercises W's 3 sec x 10 focus on posterior shd girdle; reaching for floor for scap depression 5 sec x 10       Shoulder Exercises: Stretch   Other Shoulder Stretches neural stretch supine 60 sec  x 2    Other Shoulder Stretches 3 way doorway 30 sec x 2 each      Moist Heat Therapy   Number Minutes Moist Heat 20 Minutes   Moist Heat Location --  abdomen     Electrical Stimulation   Electrical Stimulation Location Lt cervical and shoulder area    Electrical Stimulation Action IFC   Electrical Stimulation Parameters to tolerance   Electrical Stimulation Goals Pain;Tone     Ultrasound   Ultrasound Location Lt anterior shoudler/biceps    Ultrasound Parameters 1.5 w/cm@; 1 mHz; 100; 8 min    Ultrasound Goals Pain;Other (Comment)  tightness      Manual Therapy   Manual Therapy --  focus on tightness Lt pecs and biceps/anterior deltoid    Manual therapy comments pt supine    Joint Mobilization inferior glide; posterior glide   Soft tissue mobilization Lt ant/lat cervical pecs; anterior deltoid; biceps; traps    Myofascial Release cervical to upper traps    Passive ROM Lt shoulder flexion; abduction in scaption; ER in scaption                   PT Short Term Goals -  08/07/17 1247      PT SHORT TERM GOAL #1   Title Increase AROM cervical spine to WFL's with rotation and lateral flexion ~equal bilat 09/01/17   Time 4   Period Weeks   Status On-going     PT SHORT TERM GOAL #2   Title Decrease radicular symptoms Lt UE by 50-75% with functional activities 09/01/17   Time 4   Period Weeks   Status On-going           PT Long Term Goals - 08/14/17 1239      PT LONG TERM GOAL #1   Title Increase AROM Lt shoudler to equal or greater than AROM Lt shoudler 09/15/17   Time 6   Period Weeks   Status Partially Met  good gains in AROM Lt shoulder      PT LONG TERM GOAL #2   Title 4+/5 to 5/5 strength Lt shoudler and posterior shoudler girdle 09/15/17   Time 6   Period Weeks   Status On-going     PT LONG TERM GOAL #3   Title Decrease pain to no more than 1-2/10 for all functional and work tasks 09/15/17   Time 6   Period Weeks   Status On-going     PT  LONG TERM GOAL #4   Title Independent in HEP 09/15/17   Time 6   Period Weeks   Status On-going     PT LONG TERM GOAL #5   Title Improve FOTO to </= 36% limitation 09/15/17   Time 6   Period Weeks   Status On-going               Plan - 08/17/17 1546    Clinical Impression Statement Continued up and down course with symptoms increased with work days activities. She has muscular tightness through the anterior shoudler and expecially in the biceps. Overall good gains in ROM; improveing postural control; increasing strength. Johnetta has continued muscular tightness and pain on an intermittent basis. She will benefit form continue rehab to accomplish goals of therapy.    Rehab Potential Good   PT Frequency 2x / week   PT Duration 6 weeks   PT Treatment/Interventions Patient/family education;ADLs/Self Care Home Management;Neuromuscular re-education;Cryotherapy;Electrical Stimulation;Iontophoresis 31m/ml Dexamethasone;Moist Heat;Ultrasound;Dry needling;Manual techniques;Therapeutic activities;Therapeutic exercise   PT Next Visit Plan continue modification of HEP; review HEP; work on posterior shoudler girdle engagement; progress with PROM; focus on posture and ROM with posterior shoulder engaged; continue taping as indicated; manual work    COncologistwith Plan of Care Patient      Patient will benefit from skilled therapeutic intervention in order to improve the following deficits and impairments:  Postural dysfunction, Improper body mechanics, Pain, Increased fascial restricitons, Increased muscle spasms, Decreased mobility, Decreased range of motion, Decreased strength, Decreased activity tolerance  Visit Diagnosis: Cervicalgia  Other symptoms and signs involving the musculoskeletal system  Muscle weakness (generalized)  Acute pain of left shoulder     Problem List There are no active problems to display for this patient.   [place] MPH 08/17/2017, 4:24  PM  [facility] [address]Phone: [phone]  Fax:  [phone] Name: VEDIA PURSIFULLMRN: [id number] of Birth: 1Apr 08, 1952

## 2017-08-21 ENCOUNTER — Ambulatory Visit (INDEPENDENT_AMBULATORY_CARE_PROVIDER_SITE_OTHER): Payer: 59 | Admitting: Rehabilitative and Restorative Service Providers"

## 2017-08-21 ENCOUNTER — Encounter: Payer: Self-pay | Admitting: Rehabilitative and Restorative Service Providers"

## 2017-08-21 DIAGNOSIS — M6281 Muscle weakness (generalized): Secondary | ICD-10-CM | POA: Diagnosis not present

## 2017-08-21 DIAGNOSIS — M542 Cervicalgia: Secondary | ICD-10-CM

## 2017-08-21 DIAGNOSIS — M25512 Pain in left shoulder: Secondary | ICD-10-CM | POA: Diagnosis not present

## 2017-08-21 DIAGNOSIS — R29898 Other symptoms and signs involving the musculoskeletal system: Secondary | ICD-10-CM | POA: Diagnosis not present

## 2017-08-21 NOTE — Patient Instructions (Signed)
Resisted External Rotation: in Neutral - Bilateral   PALMS UP Sit or stand, tubing in both hands, elbows at sides, bent to 90, forearms forward. Pinch shoulder blades together and rotate forearms out. Keep elbows at sides. Repeat __10__ times per set. Do _2-3___ sets per session. Do _1-2___ sessions per day. Yellow band   Low Row: Standing   Face anchor, feet shoulder width apart. Palms up, pull arms back, squeezing shoulder blades together. Repeat 10__ times per set. Do 2-3__ sets per session. Do 1-2__ sessions per day Anchor Height: Waist red or green band   Also step back with left foot and turn body slightly as if shooting a bow and arrow  Reps as above - - -red band   Scapular Retraction: Rowing (Eccentric) - Arms - 45 Degrees (Resistance Band)    Hold end of band in each hand. Pull back until elbows are even with trunk. Keep elbows out from sides at 45, thumbs up. Slowly release for 3-5 seconds. Use ___red or green_____ resistance band. __10_ reps per set, 2-3_ sets per day, _1-2__tiems/day  Strengthening: Resisted Extension   Hold tubing in right hand, arm forward. Pull arm back, elbow straight. Repeat _10___ times per set. Do 2-3____ sets per session. Do 1-2____ sessions per day. Red or green attach band to door

## 2017-08-21 NOTE — Therapy (Signed)
[facility], [place], [address] Phone: [phone]   Fax:  [phone]  Physical Therapy Treatment  Patient Details  Name: Mary Freeman MRN: [id number] Date of Birth: 1951-09-16 Referring Provider: Dr Justice Britain  Encounter Date: 08/21/2017      PT End of Session - 08/21/17 0812    Visit Number 6   Number of Visits 12   Date for PT Re-Evaluation 09/15/17   PT Start Time 0800   PT Stop Time 0902   PT Time Calculation (min) 62 min   Activity Tolerance Patient tolerated treatment well      Past Medical History:  Diagnosis Date  . Depression   . Headache    migraines  . PONV (postoperative nausea and vomiting)     Past Surgical History:  Procedure Laterality Date  . APPENDECTOMY    . BREAST EXCISIONAL BIOPSY Right    benign  . COLONOSCOPY    . KNEE ARTHROSCOPY Right    x3  . KNEE ARTHROSCOPY W/ MENISCAL REPAIR Left   . LUMBAR LAMINECTOMY  1986  . REDUCTION MAMMAPLASTY Bilateral    2006  . ROTATOR CUFF REPAIR Right 2012  . TONSILLECTOMY    . TUBAL LIGATION      There were no vitals filed for this visit.      Subjective Assessment - 08/21/17 0813    Subjective Continued discomfort not really pain in the Lt shoulder. Work irritates shoulder.    Currently in Pain? No/denies  just discomfort             [facility] PT Assessment - 08/21/17 0001      Assessment   Medical Diagnosis Cervical/Lt shoulder dysfunction    Referring Provider Dr Justice Britain   Onset Date/Surgical Date 05/25/17   Hand Dominance Right   Next MD Visit 08/21/17   Prior Therapy yes     Sensation   Additional Comments soreness into the elbow; forearm'; none in thumb; index finger      Posture/Postural Control   Posture Comments improving postue and alignment with increased posterior shoudler girdle control      AROM   Left Shoulder Extension 52 Degrees   Left Shoulder Flexion 146 Degrees   Left  Shoulder ABduction 150 Degrees   Left Shoulder Internal Rotation 27 Degrees   Left Shoulder External Rotation 92 Degrees     Palpation   Palpation comment tightness Lt posterior/lateral cervical musculature, upper trap, leveator, anterior chest/pecs, biceps                      [facility] Adult PT Treatment/Exercise - 08/21/17 0001      Shoulder Exercises: Standing   Extension Strengthening;Left;10 reps   Theraband Level (Shoulder Extension) Level 3 (Green)   Row Strengthening;Both;20 reps;Theraband   Theraband Level (Shoulder Row) Level 3 (Green)   Row Limitations added row at 45 deg red TB x 20; row with step back red TB x 20    Retraction Strengthening;Both;20 reps;Theraband   Theraband Level (Shoulder Retraction) Level 1 (Yellow)   Other Standing Exercises scap squeeze 10 sec x 10    Other Standing Exercises W's 3 sec x 10 focus on posterior shd girdle; reaching for floor for scap depression 5 sec x 10       Shoulder Exercises: Stretch   Other Shoulder Stretches 3 way doorway 30 sec x 2 each      Moist Heat Therapy   Number  Minutes Moist Heat 20 Minutes   Moist Heat Location --  abdomen     Electrical Stimulation   Electrical Stimulation Location Lt cervical and shoulder area    Electrical Stimulation Action IFC   Electrical Stimulation Parameters to tolerance   Electrical Stimulation Goals Pain;Tone     Ultrasound   Ultrasound Location Lt anterior shoudler/biceps   Ultrasound Parameters 1.5 w/cm2; 1 mHz; 100%; 8 min    Ultrasound Goals Pain;Other (Comment)  tightness      Manual Therapy   Manual Therapy --  focus on tightness Lt pecs and biceps/anterior deltoid    Manual therapy comments pt supine    Joint Mobilization inferior glide; posterior glide   Soft tissue mobilization Lt ant/lat cervical pecs; anterior deltoid; biceps; traps    Myofascial Release cervical to upper traps    Passive ROM Lt shoulder flexion; abduction in scaption; ER in scaption                  PT Education - 08/21/17 0826    Education provided Yes   Education Details HEP    Person(s) Educated Patient   Methods Explanation;Demonstration;Tactile cues;Verbal cues;Handout   Comprehension Verbalized understanding;Returned demonstration;Verbal cues required;Tactile cues required          PT Short Term Goals - 08/21/17 1123      PT SHORT TERM GOAL #1   Title Increase AROM cervical spine to WFL's with rotation and lateral flexion ~equal bilat 09/01/17   Time 4   Period Weeks   Status Partially Met     PT SHORT TERM GOAL #2   Title Decrease radicular symptoms Lt UE by 50-75% with functional activities 09/01/17   Time 4   Period Weeks   Status Achieved           PT Long Term Goals - 08/21/17 0812      PT LONG TERM GOAL #1   Title Increase AROM Lt shoudler to equal or greater than AROM Lt shoudler 09/15/17   Time 6   Period Weeks   Status Partially Met     PT LONG TERM GOAL #2   Title 4+/5 to 5/5 strength Lt shoudler and posterior shoudler girdle 09/15/17   Time 6   Period Weeks   Status On-going     PT LONG TERM GOAL #3   Title Decrease pain to no more than 1-2/10 for all functional and work tasks 09/15/17   Time 6   Period Weeks   Status On-going     PT LONG TERM GOAL #4   Title Independent in HEP 09/15/17   Time 6   Period Weeks   Status On-going     PT LONG TERM GOAL #5   Title Improve FOTO to </= 36% limitation 09/15/17   Time 6   Period Weeks   Status On-going               Plan - 08/21/17 0816    Clinical Impression Statement Tolerated additional strengthening exercises well. Progressing well with strengthening. Good improvement in posture and alignment with posterior shoudler girdle engaged.    Rehab Potential Good   PT Frequency 2x / week   PT Duration 6 weeks   PT Treatment/Interventions Patient/family education;ADLs/Self Care Home Management;Neuromuscular re-education;Cryotherapy;Electrical  Stimulation;Iontophoresis '4mg'$ /ml Dexamethasone;Moist Heat;Ultrasound;Dry needling;Manual techniques;Therapeutic activities;Therapeutic exercise   PT Next Visit Plan continue modification of HEP; review HEP; work on posterior shoudler girdle engagement; progress with PROM; focus on posture and ROM with posterior shoulder  engaged; continue taping as indicated; manual work    Oncologist with Plan of Care Patient      Patient will benefit from skilled therapeutic intervention in order to improve the following deficits and impairments:  Postural dysfunction, Improper body mechanics, Pain, Increased fascial restricitons, Increased muscle spasms, Decreased mobility, Decreased range of motion, Decreased strength, Decreased activity tolerance  Visit Diagnosis: Cervicalgia  Other symptoms and signs involving the musculoskeletal system  Muscle weakness (generalized)  Acute pain of left shoulder     Problem List There are no active problems to display for this patient.   Tyler, MPH  08/21/2017, 11:25 AM  [facility] [place] [place], [place], [address] Phone: [phone]   Fax:  [phone]  Name: Mary Freeman MRN: [id number] Date of Birth: 1951-06-21

## 2017-08-25 ENCOUNTER — Encounter: Payer: Self-pay | Admitting: Rehabilitative and Restorative Service Providers"

## 2017-08-25 ENCOUNTER — Ambulatory Visit (INDEPENDENT_AMBULATORY_CARE_PROVIDER_SITE_OTHER): Payer: 59 | Admitting: Rehabilitative and Restorative Service Providers"

## 2017-08-25 DIAGNOSIS — M542 Cervicalgia: Secondary | ICD-10-CM

## 2017-08-25 DIAGNOSIS — M6281 Muscle weakness (generalized): Secondary | ICD-10-CM | POA: Diagnosis not present

## 2017-08-25 DIAGNOSIS — M25512 Pain in left shoulder: Secondary | ICD-10-CM | POA: Diagnosis not present

## 2017-08-25 DIAGNOSIS — R29898 Other symptoms and signs involving the musculoskeletal system: Secondary | ICD-10-CM | POA: Diagnosis not present

## 2017-08-25 NOTE — Patient Instructions (Addendum)
Upper Back Strength: Lower Trapezius / Rotator Cuff    Arms in waitress pose, palms up. Press hands back and slide shoulder blades down. Hold for ___2-3. Repeat __10__ times.  Scapular Retraction: Elbow Flexion (Standing)    With elbows bent to 90, pinch shoulder blades together and rotate arms out, keeping elbows bent. Repeat __10__ times per set. Do __1-2__ sets per session. Do _several ___ sessions per day.  Row: Bent Over - Single Arm (Dumbbell)    Lift weight to side of chest, keeping elbow close to body. Do __2-3__ sets of _10___ repetitions.  Kickback (Dumbbell)    Stand with feet staggered, upper body supported by ball, upper arm parallel to floor. Raise dumbbell by extending elbow. Repeat with other arm. Do _2-3___ sets of _10___ repetitions.  Strengthening: Resisted Internal Rotation    Hold tubing in left hand, elbow at side and forearm out. Rotate forearm towards body and pause when hand is pointed straight ahead. Return to the starting position.  Repeat _10___ times per set. Do __2-3__ sets per session. Do _1-2___ sessions per day. Abduction (Dumbbell)    Stand with arms at sides. Lift arms out from sides, THUMBS UP, on a 45 degree angle from body.  Repeat __10__ times per set. Do __2-3__ sets per session.Use __2__ lb weights.  Progressive Resisted: External Rotation (Side-Lying)    Holding _2___ pound weight, towel under arm, raise right forearm toward ceiling. Keep elbow bent and at side. Repeat _10__ times per set. Do __2__ sets per session. Do _1-2___ sessions per day.   Trigger Point Dry Needling  . What is Trigger Point Dry Needling (DN)? o DN is a physical therapy technique used to treat muscle pain and dysfunction. Specifically, DN helps deactivate muscle trigger points (muscle knots).  o A thin filiform needle is used to penetrate the skin and stimulate the underlying trigger point. The goal is for a local twitch response (LTR) to occur and  for the trigger point to relax. No medication of any kind is injected during the procedure.   . What Does Trigger Point Dry Needling Feel Like?  o The procedure feels different for each individual patient. Some patients report that they do not actually feel the needle enter the skin and overall the process is not painful. Very mild bleeding may occur. However, many patients feel a deep cramping in the muscle in which the needle was inserted. This is the local twitch response.   Marland Kitchen How Will I feel after the treatment? o Soreness is normal, and the onset of soreness may not occur for a few hours. Typically this soreness does not last longer than two days.  o Bruising is uncommon, however; ice can be used to decrease any possible bruising.  o In rare cases feeling tired or nauseous after the treatment is normal. In addition, your symptoms may get worse before they get better, this period will typically not last longer than 24 hours.   . What Can I do After My Treatment? o Increase your hydration by drinking more water for the next 24 hours. o You may place ice or heat on the areas treated that have become sore, however, do not use heat on inflamed or bruised areas. Heat often brings more relief post needling. o You can continue your regular activities, but vigorous activity is not recommended initially after the treatment for 24 hours. o DN is best combined with other physical therapy such as strengthening, stretching, and other therapies.  [facility] [place], [place] [address]  [phone] (office) [phone] (fax)

## 2017-08-25 NOTE — Therapy (Signed)
[place] [facility] [place], [place], [address] Phone: [phone]   Fax:  [phone]  Physical Therapy Treatment  Patient Details  Name: Mary Freeman MRN: [id number] Date of Birth: 1951-09-24 Referring Provider: Dr Justice Britain  Encounter Date: 08/25/2017      PT End of Session - 08/25/17 1108    Visit Number 6   Number of Visits 12   Date for PT Re-Evaluation 09/15/17   PT Start Time 1102   PT Stop Time 1215   PT Time Calculation (min) 73 min   Activity Tolerance Patient tolerated treatment well      Past Medical History:  Diagnosis Date  . Depression   . Headache    migraines  . PONV (postoperative nausea and vomiting)     Past Surgical History:  Procedure Laterality Date  . APPENDECTOMY    . BREAST EXCISIONAL BIOPSY Right    benign  . COLONOSCOPY    . KNEE ARTHROSCOPY Right    x3  . KNEE ARTHROSCOPY W/ MENISCAL REPAIR Left   . LUMBAR LAMINECTOMY  1986  . REDUCTION MAMMAPLASTY Bilateral    2006  . ROTATOR CUFF REPAIR Right 2012  . TONSILLECTOMY    . TUBAL LIGATION      There were no vitals filed for this visit.      Subjective Assessment - 08/25/17 1108    Subjective Shoulder continues to hurt - feels this is related to activities at work. She continues to work on her HEP. Has not had nerve "twitching" in the Lt arm    Currently in Pain? Yes   Pain Score 4    Pain Location Shoulder   Pain Orientation Left   Pain Descriptors / Indicators Sore;Nagging;Aching   Pain Type Acute pain   Pain Onset More than a month ago                         [facility] Adult PT Treatment/Exercise - 08/25/17 0001      Shoulder Exercises: Sidelying   External Rotation Strengthening;Left;10 reps;Weights   External Rotation Weight (lbs) 2     Shoulder Exercises: Standing   External Rotation Strengthening;Left;15 reps   Theraband Level (Shoulder External Rotation) Level 2 (Red)   Internal  Rotation Strengthening;Left;15 reps;Theraband  stop at neutral    Theraband Level (Shoulder Internal Rotation) Level 2 (Red)   Extension Strengthening;Left;10 reps   Theraband Level (Shoulder Extension) Level 3 (Green)   Row Strengthening;Both;20 reps;Theraband   Theraband Level (Shoulder Row) Level 3 (Green)   Row Limitations added row at 45 deg red TB x 20; row with step back red TB x 20    Retraction Strengthening;Both;20 reps;Theraband   Theraband Level (Shoulder Retraction) Level 1 (Yellow)   Other Standing Exercises scap squeeze 10 sec x 10; L's; W's x 10 each     Other Standing Exercises bent row 2# x 10; triceps kickbacks 2# x10      Shoulder Exercises: Pulleys   Flexion 2 minutes     Shoulder Exercises: Stretch   Other Shoulder Stretches 3 way doorway 30 sec x 2 each      Moist Heat Therapy   Number Minutes Moist Heat 20 Minutes   Moist Heat Location --  abdomen     Electrical Stimulation   Electrical Stimulation Location Lt cervical and shoulder area    Electrical Stimulation Action IFC   Electrical Stimulation Parameters to tolerance   Electrical  Stimulation Goals Pain;Tone     Ultrasound   Ultrasound Location Tl anterior shoudler; biceps    Ultrasound Parameters 1.5 w/cm2' 1 mHz; 100%; 8 min    Ultrasound Goals Pain;Other (Comment)  tightness      Manual Therapy   Manual Therapy --  focus on tightness Lt pecs and biceps/anterior deltoid    Manual therapy comments pt supine    Joint Mobilization inferior glide; posterior glide   Soft tissue mobilization Lt ant/lat cervical pecs; anterior deltoid; biceps; traps    Myofascial Release cervical to upper traps    Passive ROM Lt shoulder flexion; abduction in scaption; ER in scaption           Trigger Point Dry Needling - 08/25/17 1157    Consent Given? Yes   Education Handout Provided Yes   Muscles Treated Upper Body --  biceps/anterior deltoid Lt - decreased palpable tightness               PT  Education - 08/25/17 1125    Education provided Yes   Education Details HEP DN    Person(s) Educated Patient   Methods Explanation;Handout;Demonstration;Tactile cues;Verbal cues   Comprehension Verbalized understanding          PT Short Term Goals - 08/25/17 1214      PT SHORT TERM GOAL #1   Title Increase AROM cervical spine to WFL's with rotation and lateral flexion ~equal bilat 09/01/17   Time 4   Period Weeks   Status Partially Met     PT SHORT TERM GOAL #2   Title Decrease radicular symptoms Lt UE by 50-75% with functional activities 09/01/17   Time 4   Period Weeks   Status Achieved           PT Long Term Goals - 08/21/17 0812      PT LONG TERM GOAL #1   Title Increase AROM Lt shoudler to equal or greater than AROM Lt shoudler 09/15/17   Time 6   Period Weeks   Status Partially Met     PT LONG TERM GOAL #2   Title 4+/5 to 5/5 strength Lt shoudler and posterior shoudler girdle 09/15/17   Time 6   Period Weeks   Status On-going     PT LONG TERM GOAL #3   Title Decrease pain to no more than 1-2/10 for all functional and work tasks 09/15/17   Time 6   Period Weeks   Status On-going     PT LONG TERM GOAL #4   Title Independent in HEP 09/15/17   Time 6   Period Weeks   Status On-going     PT LONG TERM GOAL #5   Title Improve FOTO to </= 36% limitation 09/15/17   Time 6   Period Weeks   Status On-going               Plan - 08/25/17 1159    Clinical Impression Statement Continued intermittent pain in Lt shoudler which is increased with work tasks/activities. She added additonal exercises without difficulty. Tyona requires repeated instruction and verbal/tactile cues for correct technique. Progressing gradually toward goals - continues to have intermittent pain related to activity.    Rehab Potential Good   PT Frequency 2x / week   PT Treatment/Interventions Patient/family education;ADLs/Self Care Home Management;Neuromuscular  re-education;Cryotherapy;Electrical Stimulation;Iontophoresis '4mg'$ /ml Dexamethasone;Moist Heat;Ultrasound;Dry needling;Manual techniques;Therapeutic activities;Therapeutic exercise   PT Next Visit Plan assess response to DN; continue modification of HEP; review HEP; work on posterior shoudler  girdle engagement; progress with PROM; focus on strengthening posture and ROM with posterior shoulder engaged; continue taping as indicated; manual work    Oncologist with Plan of Care Patient      Patient will benefit from skilled therapeutic intervention in order to improve the following deficits and impairments:  Postural dysfunction, Improper body mechanics, Pain, Increased fascial restricitons, Increased muscle spasms, Decreased mobility, Decreased range of motion, Decreased strength, Decreased activity tolerance  Visit Diagnosis: Cervicalgia  Other symptoms and signs involving the musculoskeletal system  Muscle weakness (generalized)  Acute pain of left shoulder     Problem List There are no active problems to display for this patient.   Selz, MPH  08/25/2017, 12:17 PM  [facility] [place], [place], [address] Phone: [phone]   Fax:  [phone]  Name: Mary Freeman MRN: [id number] Date of Birth: December 04, 1950

## 2017-08-30 ENCOUNTER — Ambulatory Visit (INDEPENDENT_AMBULATORY_CARE_PROVIDER_SITE_OTHER): Payer: 59 | Admitting: Rehabilitative and Restorative Service Providers"

## 2017-08-30 ENCOUNTER — Encounter: Payer: Self-pay | Admitting: Rehabilitative and Restorative Service Providers"

## 2017-08-30 DIAGNOSIS — M9901 Segmental and somatic dysfunction of cervical region: Secondary | ICD-10-CM | POA: Diagnosis not present

## 2017-08-30 DIAGNOSIS — M9902 Segmental and somatic dysfunction of thoracic region: Secondary | ICD-10-CM | POA: Diagnosis not present

## 2017-08-30 DIAGNOSIS — M25512 Pain in left shoulder: Secondary | ICD-10-CM

## 2017-08-30 DIAGNOSIS — M531 Cervicobrachial syndrome: Secondary | ICD-10-CM | POA: Diagnosis not present

## 2017-08-30 DIAGNOSIS — M9903 Segmental and somatic dysfunction of lumbar region: Secondary | ICD-10-CM | POA: Diagnosis not present

## 2017-08-30 DIAGNOSIS — M542 Cervicalgia: Secondary | ICD-10-CM | POA: Diagnosis not present

## 2017-08-30 DIAGNOSIS — M6080 Other myositis, unspecified site: Secondary | ICD-10-CM | POA: Diagnosis not present

## 2017-08-30 DIAGNOSIS — M6281 Muscle weakness (generalized): Secondary | ICD-10-CM | POA: Diagnosis not present

## 2017-08-30 DIAGNOSIS — M9904 Segmental and somatic dysfunction of sacral region: Secondary | ICD-10-CM | POA: Diagnosis not present

## 2017-08-30 DIAGNOSIS — R29898 Other symptoms and signs involving the musculoskeletal system: Secondary | ICD-10-CM | POA: Diagnosis not present

## 2017-08-30 NOTE — Therapy (Addendum)
[place], [place], [address] Phone: [phone]   Fax:  [phone]  Physical Therapy Treatment  Patient Details  Name: Mary Freeman MRN: [id number] Date of Birth: 03-28-1951 Referring Provider: Dr Justice Britain  Encounter Date: 08/30/2017      PT End of Session - 08/30/17 1105    Visit Number 7   Number of Visits 12   Date for PT Re-Evaluation 09/15/17   PT Start Time 1101   PT Stop Time 1200   PT Time Calculation (min) 59 min   Activity Tolerance Patient tolerated treatment well      Past Medical History:  Diagnosis Date  . Depression   . Headache    migraines  . PONV (postoperative nausea and vomiting)     Past Surgical History:  Procedure Laterality Date  . APPENDECTOMY    . BREAST EXCISIONAL BIOPSY Right    benign  . COLONOSCOPY    . KNEE ARTHROSCOPY Right    x3  . KNEE ARTHROSCOPY W/ MENISCAL REPAIR Left   . LUMBAR LAMINECTOMY  1986  . REDUCTION MAMMAPLASTY Bilateral    2006  . ROTATOR CUFF REPAIR Right 2012  . TONSILLECTOMY    . TUBAL LIGATION      There were no vitals filed for this visit.      Subjective Assessment - 08/30/17 1104    Subjective Shoulder feels good today - had a couple of days when she had some pain. Thinks that is related to activities at home - mowing the yard, working around the house. Doing her exercises at home.    Currently in Pain? No/denies                         [facility] Adult PT Treatment/Exercise - 08/30/17 0001      Shoulder Exercises: Prone   Other Prone Exercises prone head and chest extension x 10; hands clasp behind back with extension x 10; w's x 10      Shoulder Exercises: Standing   External Rotation Strengthening;Left;15 reps   Theraband Level (Shoulder External Rotation) Level 2 (Red)   Internal Rotation Strengthening;Left;15 reps;Theraband  stop at neutral    Theraband Level (Shoulder Internal Rotation)  Level 2 (Red)   Row Strengthening;Both;20 reps;Theraband   Theraband Level (Shoulder Row) Level 4 (Blue)   Row Limitations row at 45 deg x 10 blue TB; added row at 80 deg green TB x 10    Other Standing Exercises scap squeeze 10 sec x 10; L's; W's x 10 each     Other Standing Exercises bent row 2# x 10; triceps kickbacks 2# x10      Shoulder Exercises: Stretch   Other Shoulder Stretches 3 way doorway 30 sec x 2 each      Moist Heat Therapy   Number Minutes Moist Heat 20 Minutes   Moist Heat Location --  abdomen     Electrical Stimulation   Electrical Stimulation Location Lt cervical and shoulder area    Electrical Stimulation Action IFC   Electrical Stimulation Parameters to tolerance   Electrical Stimulation Goals Pain;Tone     Ultrasound   Ultrasound Location Lt anterior shd/pecs   Ultrasound Parameters 1.5 w/cm2; 1 mHz; 100%; 8 min    Ultrasound Goals Pain;Other (Comment)  tightness      Manual Therapy   Manual Therapy --  focus on tightness Lt pecs and biceps/anterior deltoid    Manual  therapy comments pt supine    Joint Mobilization inferior glide; posterior glide   Soft tissue mobilization Lt ant/lat cervical pecs; anterior deltoid; biceps; traps    Myofascial Release cervical to upper traps    Passive ROM Lt shoulder flexion; abduction in scaption; ER in scaption                 PT Education - 08/30/17 1133    Education provided Yes   Education Details HEP    Person(s) Educated Patient   Methods Explanation;Demonstration;Tactile cues;Verbal cues;Handout   Comprehension Verbalized understanding;Returned demonstration;Verbal cues required;Tactile cues required          PT Short Term Goals - 08/30/17 1204      PT SHORT TERM GOAL #1   Title Increase AROM cervical spine to WFL's with rotation and lateral flexion ~equal bilat 09/01/17   Time 4   Period Weeks   Status Partially Met     PT SHORT TERM GOAL #2   Title Decrease radicular symptoms Lt UE by  50-75% with functional activities 09/01/17   Time 4   Period Weeks   Status Achieved           PT Long Term Goals - 08/30/17 1204      PT LONG TERM GOAL #1   Title Increase AROM Lt shoudler to equal or greater than AROM Lt shoudler 09/15/17   Time 6   Period Weeks   Status Partially Met     PT LONG TERM GOAL #2   Title 4+/5 to 5/5 strength Lt shoudler and posterior shoudler girdle 09/15/17   Time 6   Period Weeks   Status On-going     PT LONG TERM GOAL #3   Title Decrease pain to no more than 1-2/10 for all functional and work tasks 09/15/17   Time 6   Period Weeks   Status On-going     PT LONG TERM GOAL #4   Title Independent in HEP 09/15/17   Time 6   Period Weeks   Status On-going     PT LONG TERM GOAL #5   Title Improve FOTO to </= 36% limitation 09/15/17   Time 6   Period Weeks   Status On-going               Plan - 08/30/17 1159    Clinical Impression Statement Episodic flare up of pain with activities. Working on strengthening - adding exercises today without difficulty. Tolerated DN well. Continues to demonstrate improved posture and alignment. Progressing gradually toward stated goals of therapy.    Rehab Potential Good   PT Frequency 2x / week   PT Duration 6 weeks   PT Treatment/Interventions Patient/family education;ADLs/Self Care Home Management;Neuromuscular re-education;Cryotherapy;Electrical Stimulation;Iontophoresis 69m/ml Dexamethasone;Moist Heat;Ultrasound;Dry needling;Manual techniques;Therapeutic activities;Therapeutic exercise   PT Next Visit Plan continue DN as indicated; continue modification of HEP; review HEP; work on posterior shoudler girdle engagement; progress with PROM; focus on strengthening posture and ROM with posterior shoulder engaged; continue taping as indicated; manual work    COncologistwith Plan of Care Patient      Patient will benefit from skilled therapeutic intervention in order to improve the following  deficits and impairments:  Postural dysfunction, Improper body mechanics, Pain, Increased fascial restricitons, Increased muscle spasms, Decreased mobility, Decreased range of motion, Decreased strength, Decreased activity tolerance  Visit Diagnosis: Cervicalgia  Other symptoms and signs involving the musculoskeletal system  Muscle weakness (generalized)  Acute pain of left shoulder  Problem List There are no active problems to display for this patient.   Par[place]lee, MPH  08/30/2017, 12:06 PM  [facility] [place], [place], [address] Phone: [phone]   Fax:  [phone]  Name: Mary Freeman MRN: [id number] Date of Birth: 02-26-1951  PHYSICAL THERAPY DISCHARGE SUMMARY  Visits from Start of Care: 7  Current functional level related to goals / functional outcomes: See progress note for discharge status    Remaining deficits: Intermittent discomfort with work Advertising account planner / Equipment: HEP Plan: Patient agrees to discharge.  Patient goals were partially met. Patient is being discharged due to being pleased with the current functional level.  ?????      P. Helene Kelp PT, MPH 10/09/17 2:29 PM

## 2017-08-30 NOTE — Patient Instructions (Addendum)
Start with 5-10 reps 1-2 sets 1 x/day or every other day  Add 1# weight for W   Shoulder Blade Squeeze: Arms at Sides    Arms at sides, parallel, elbows straight, palms up. Press pelvis down. Squeeze backbone with shoulder blades, raising front of shoulders, chest, and arms. Keep head and neck neutral. Hold ___ seconds. Relax. Repeat ___ times. Shoulder Blade Squeeze: Fingers Interlaced    Fingers interlaced behind your body, palms facing each other. Press pelvis down. Squeeze backbone with shoulder blades. Raise front of shoulders, chest, and head. Keep neck neutral. Hold ___ seconds. Relax. Repeat ___ times.   Shoulder Blade Squeeze: W    Arms out to sides at 90 palms down. Bend elbows to 90. Press pelvis down. Squeeze backbone with shoulder blades. Raise arms, front of shoulders, chest, and head. Keep neck neutral. Hold ___ seconds. Relax. Repeat ___ times.  Scapular Retraction: Rowing (Eccentric) - Arms - 90 Degrees (Resistance Band)    Hold end of band in each hand. Pull back until elbows are even with trunk, and out from sides at 90, thumbs in. Slowly release for 3-5 seconds. Use __red or green______ resistance band. 10___ reps per set, _1-3_ sets 1x/day or every other day

## 2017-09-05 MED FILL — PROGESTERONE 100 MG CAPSULE: 100 | 90 days supply | Qty: 90 | Fill #2

## 2017-09-06 ENCOUNTER — Encounter: Payer: 59 | Admitting: Rehabilitative and Restorative Service Providers"

## 2017-09-11 DIAGNOSIS — M9903 Segmental and somatic dysfunction of lumbar region: Secondary | ICD-10-CM | POA: Diagnosis not present

## 2017-09-11 DIAGNOSIS — M9904 Segmental and somatic dysfunction of sacral region: Secondary | ICD-10-CM | POA: Diagnosis not present

## 2017-09-11 DIAGNOSIS — M531 Cervicobrachial syndrome: Secondary | ICD-10-CM | POA: Diagnosis not present

## 2017-09-11 DIAGNOSIS — M9901 Segmental and somatic dysfunction of cervical region: Secondary | ICD-10-CM | POA: Diagnosis not present

## 2017-09-11 DIAGNOSIS — M9902 Segmental and somatic dysfunction of thoracic region: Secondary | ICD-10-CM | POA: Diagnosis not present

## 2017-09-11 DIAGNOSIS — M6080 Other myositis, unspecified site: Secondary | ICD-10-CM | POA: Diagnosis not present

## 2017-09-14 ENCOUNTER — Encounter: Payer: 59 | Admitting: Rehabilitative and Restorative Service Providers"

## 2017-09-20 ENCOUNTER — Emergency Department (HOSPITAL_COMMUNITY): Payer: 59

## 2017-09-20 ENCOUNTER — Encounter (HOSPITAL_COMMUNITY): Payer: Self-pay | Admitting: Emergency Medicine

## 2017-09-20 ENCOUNTER — Emergency Department (HOSPITAL_COMMUNITY)
Admission: EM | Admit: 2017-09-20 | Discharge: 2017-09-20 | Disposition: A | Discharge status: Home / Self Care | Payer: 59 | Attending: Emergency Medicine | Admitting: Emergency Medicine

## 2017-09-20 DIAGNOSIS — R7989 Other specified abnormal findings of blood chemistry: Secondary | ICD-10-CM

## 2017-09-20 DIAGNOSIS — R002 Palpitations: Secondary | ICD-10-CM

## 2017-09-20 DIAGNOSIS — Z79899 Other long term (current) drug therapy: Secondary | ICD-10-CM | POA: Insufficient documentation

## 2017-09-20 DIAGNOSIS — R0602 Shortness of breath: Secondary | ICD-10-CM | POA: Insufficient documentation

## 2017-09-20 DIAGNOSIS — R009 Unspecified abnormalities of heart beat: Secondary | ICD-10-CM | POA: Diagnosis present

## 2017-09-20 LAB — BASIC METABOLIC PANEL
ANION GAP: 10 (ref 5–15)
BUN: 21 mg/dL — AB (ref 6–20)
CALCIUM: 9.5 mg/dL (ref 8.9–10.3)
CO2: 24 mmol/L (ref 22–32)
CREATININE: 1.2 mg/dL — AB (ref 0.44–1.00)
Chloride: 106 mmol/L (ref 101–111)
GFR calc Af Amer: 54 mL/min — ABNORMAL LOW (ref >60)
GFR calc non Af Amer: 47 mL/min — ABNORMAL LOW (ref >60)
GLUCOSE: 136 mg/dL — AB (ref 65–99)
Potassium: 3.6 mmol/L (ref 3.5–5.1)
Sodium: 140 mmol/L (ref 135–145)

## 2017-09-20 LAB — CBC
HCT: 39.6 % (ref 36.0–46.0)
HEMOGLOBIN: 14.2 g/dL (ref 12.0–15.0)
MCH: 31.2 pg (ref 26.0–34.0)
MCHC: 35.9 g/dL (ref 30.0–36.0)
MCV: 87 fL (ref 78.0–100.0)
Platelets: 266 10*3/uL (ref 150–400)
RBC: 4.55 MIL/uL (ref 3.87–5.11)
RDW: 12.5 % (ref 11.5–15.5)
WBC: 7.5 10*3/uL (ref 4.0–10.5)

## 2017-09-20 LAB — POCT I-STAT TROPONIN I
Troponin i, poc: 0 ng/mL (ref 0.00–0.08)
Troponin i, poc: 0.01 ng/mL (ref 0.00–0.08)

## 2017-09-20 LAB — TSH: TSH: 1.97 u[IU]/mL (ref 0.350–4.500)

## 2017-09-20 LAB — D-DIMER, QUANTITATIVE: D-Dimer, Quant: 0.27 ug/mL-FEU (ref 0.00–0.50)

## 2017-09-20 MED ORDER — SODIUM CHLORIDE 0.9 % IV BOLUS (SEPSIS)
1000.0000 mL | Freq: Once | INTRAVENOUS | Status: AC
  Administered 2017-09-20: 1000 mL via INTRAVENOUS

## 2017-09-20 NOTE — ED Provider Notes (Signed)
[facility] DEPT Provider Note   CSN: [id number] Arrival date & time: 09/20/17  0152     History   Chief Complaint Chief Complaint  Patient presents with  . Palpitations    HPI Mary Freeman is a 66 y.o. female with a hx of depression, migraine HA presents to the Emergency Department complaining of acute episode of palpitations at rest onset 11pm after a 12 hr shift.  Pt states the episode lasted approx 5-22min and was associated with some mild shortness of breath headedness.  Pt reports HR was approx 150 and irregular.  Pt reports decaf tea tonight and no regular consumption of caffeine. Denies cocaine or other stimulant usage.  She is a non-smoker.  She states taking ativan just before bed, but had 1 additional episode while lying in bed that lasted a similar amount of time.  Pt reports similar episode approx 20 years ago with holter monitor and no arrhythmias.  Pt has no associated nausea, vomiting, diaphoresis, chest pain, abd pain, weakness, syncope, near syncope.  Pt does take progesterone daily.  She denies recent travel, HX of DVT, Lupus, hx of cancer, smoking.    The history is provided by the patient and medical records. No language interpreter was used.    Past Medical History:  Diagnosis Date  . Depression   . Headache    migraines  . PONV (postoperative nausea and vomiting)     There are no active problems to display for this patient.   Past Surgical History:  Procedure Laterality Date  . APPENDECTOMY    . BREAST EXCISIONAL BIOPSY Right    benign  . COLONOSCOPY    . KNEE ARTHROSCOPY Right    x3  . KNEE ARTHROSCOPY W/ MENISCAL REPAIR Left   . LUMBAR LAMINECTOMY  1986  . REDUCTION MAMMAPLASTY Bilateral    2006  . ROTATOR CUFF REPAIR Right 2012  . TONSILLECTOMY    . TUBAL LIGATION      OB History    No data available       Home Medications    Prior to Admission medications   Medication Sig Start Date End Date Taking?  Authorizing Provider  Calcium-Magnesium (CAL-MAG PO) Take 1 tablet by mouth daily.   Yes [provider]  cholecalciferol (VITAMIN D) 1000 units tablet Take 1,000 Units by mouth daily.   Yes [provider]  FLUoxetine (PROZAC) 20 MG tablet Take 20 mg by mouth daily.   Yes [provider]  LORazepam (ATIVAN) 0.5 MG tablet Take 0.5 mg by mouth at bedtime as needed for sleep.   Yes [provider]  Multiple Vitamins-Minerals (MULTIVITAMIN WITH MINERALS) tablet Take 1 tablet by mouth daily.   Yes [provider]  rizatriptan (MAXALT) 10 MG tablet Take 10 mg by mouth as needed for migraine. May repeat in 2 hours if needed   Yes [provider]  vitamin E 100 UNIT capsule Take 100 Units by mouth daily.   Yes [provider]    Family History Family History  Problem Relation Age of Onset  . Cancer Father        lung  . Cancer Sister        breast  . Alcoholism Mother   . Alcoholism Brother   . Cancer Sister     Social History Social History  Substance Use Topics  . Smoking status: Never Smoker  . Smokeless tobacco: Never Used  . Alcohol use Yes  Comment: very rare      Allergies   Amitriptyline; Dilaudid [hydromorphone]; Diphenhydramine; Stadol [butorphanol]; Talwin [pentazocine]; Benadryl [diphenhydramine hcl]; Solu-medrol [methylprednisolone]; Butorphanol tartrate; Hydrocodone-acetaminophen; Hydromorphone hcl; Morphine and related; Other; Oxycodone-acetaminophen; Phenergan [promethazine]; and Sulfa antibiotics   Review of Systems Review of Systems  Constitutional: Negative for appetite change, diaphoresis, fatigue, fever and unexpected weight change.  HENT: Negative for mouth sores.   Eyes: Negative for visual disturbance.  Respiratory: Positive for shortness of breath ( mild). Negative for cough, chest tightness and wheezing.   Cardiovascular: Positive for palpitations. Negative for chest pain and leg swelling.    Gastrointestinal: Negative for abdominal pain, constipation, diarrhea, nausea and vomiting.  Endocrine: Negative for polydipsia, polyphagia and polyuria.  Genitourinary: Negative for dysuria, frequency, hematuria and urgency.  Musculoskeletal: Negative for back pain and neck stiffness.  Skin: Negative for rash.  Allergic/Immunologic: Negative for immunocompromised state.  Neurological: Negative for syncope, light-headedness and headaches.  Hematological: Does not bruise/bleed easily.  Psychiatric/Behavioral: Negative for sleep disturbance. The patient is not nervous/anxious.      Physical Exam Updated Vital Signs BP (!) 93/50 (BP Location: Left Arm)   Pulse 66   Temp 97.9 F (36.6 C) (Oral)   Resp 16   SpO2 98%   Physical Exam  Constitutional: She appears well-developed and well-nourished. No distress.  Awake, alert, nontoxic appearance  HENT:  Head: Normocephalic and atraumatic.  Mouth/Throat: Oropharynx is clear and moist. No oropharyngeal exudate.  Eyes: Conjunctivae are normal. No scleral icterus.  Neck: Normal range of motion. Neck supple.  Cardiovascular: Normal rate and intact distal pulses.  An irregular rhythm present.  Pulses:      Radial pulses are 2+ on the right side, and 2+ on the left side.       Dorsalis pedis pulses are 2+ on the right side, and 2+ on the left side.  Intermittent PVCs noted on monitor with slightly irregular pulse  Pulmonary/Chest: Effort normal and breath sounds normal. No respiratory distress. She has no wheezes.  Equal chest expansion  Abdominal: Soft. Bowel sounds are normal. She exhibits no mass. There is no tenderness. There is no rebound and no guarding.  Musculoskeletal: Normal range of motion. She exhibits no edema.  Neurological: She is alert.  Speech is clear and goal oriented Moves extremities without ataxia  Skin: Skin is warm and dry. She is not diaphoretic.  Psychiatric: She has a normal mood and affect.  Nursing note and  vitals reviewed.    ED Treatments / Results  Labs (all labs ordered are listed, but only abnormal results are displayed) Labs Reviewed  BASIC METABOLIC PANEL - Abnormal; Notable for the following:       Result Value   Glucose, Bld 136 (*)    BUN 21 (*)    Creatinine, Ser 1.20 (*)    GFR calc non Af Amer 47 (*)    GFR calc Af Amer 54 (*)    All other components within normal limits  CBC  TSH  D-DIMER, QUANTITATIVE (NOT AT [facility])  T4  I-STAT TROPONIN, ED  POCT I-STAT TROPONIN I  I-STAT TROPONIN, ED  POCT I-STAT TROPONIN I    EKG  EKG Interpretation  Date/Time:  Wednesday September 20 2017 01:58:31 EDT Ventricular Rate:  105 PR Interval:    QRS Duration: 94 QT Interval:  338 QTC Calculation: 447 R Axis:   85 Text Interpretation:  Sinus tachycardia Confirmed by Randal Buba, April ([id number]) on 09/20/2017 2:20:28 AM  Radiology Dg Chest 2 View  Result Date: 09/20/2017 CLINICAL DATA:  Acute onset of shortness of breath and tachycardia. Initial encounter. EXAM: CHEST  2 VIEW COMPARISON:  Chest radiograph performed 02/18/2011 FINDINGS: The lungs are well-aerated and clear. There is no evidence of focal opacification, pleural effusion or pneumothorax. The heart is normal in size; the mediastinal contour is within normal limits. No acute osseous abnormalities are seen. IMPRESSION: No acute cardiopulmonary process seen. Electronically Signed   By: Garald Balding M.D.   On: 09/20/2017 02:21    Procedures Procedures (including critical care time)  Medications Ordered in ED Medications  sodium chloride 0.9 % bolus 1,000 mL (0 mLs Intravenous Stopped 09/20/17 0500)     Initial Impression / Assessment and Plan / ED Course  I have reviewed the triage vital signs and the nursing notes.  Pertinent labs & imaging results that were available during my care of the patient were reviewed by me and considered in my medical decision making (see chart for details).  Clinical Course as of  Sep 21 543  Wed Sep 20, 2017  0434 Related in the hallway without return of symptoms, palpitations or tachycardia.  Rhythm strips shows intermittent PVCs but no rate above 80.  [HM]    Clinical Course User Index [HM] , Jarrett [place], [place]   Patient presents with complaints of palpitations.  On arrival she has a sinus tachycardia of 107.  She denies stimulant usage.  Patient with normal sinus rhythm with occasional PVCs here in the emergency department.  No arrhythmias.  The patient's symptoms have not returned.  No chest pain, leg swelling.  Patient reports she worked a 12-hour shift without shortness of breath or chest pain.  She denies dyspnea on exertion or orthopnea.  She has no history of heart failure.  No evidence of ACS.  Low risk for PE and negative d-dimer.  Normal TSH.  Initial and repeat troponin negative.  ECG reassuring; no prolonged QT or evidence of WPW.    Of note, patient with serum creatinine of 1.2 and BUN of 21.  She reports drinking plenty of fluids.  Previous creatinine was 1.07.  Discussed the need for close primary care follow-up for this.  Fluid bolus given here in the emergency room.  5:43 AM Pt remains symptom free throughout her time in the ED.  Pt with soft BP, however she reports this is a very normal BP for her.  Pt will be d/c home with close PCP and Cardiology follow-up.  Pt is to return immediately to the ED for return of symptoms.  She states understanding and is in agreement with the plan.  Final Clinical Impressions(s) / ED Diagnoses   Final diagnoses:  Elevated serum creatinine  Palpitations    New Prescriptions New Prescriptions   No medications on file     [employer] 09/20/17 [place], April, MD 09/20/17 [phone]

## 2017-09-20 NOTE — ED Triage Notes (Signed)
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

## 2017-09-20 NOTE — Discharge Instructions (Signed)
1. Medications: usual home medications 2. Treatment: rest, drink plenty of fluids, avoid caffeine, reduce stress 3. Follow Up: Please followup with your primary doctor in 2 days for discussion of your diagnoses and further evaluation after today's visit; if you do not have a primary care doctor use the resource guide provided to find one; Please return to the ER for return of symptoms

## 2017-09-21 LAB — T4: T4, Total: 6.3 ug/dL (ref 4.5–12.0)

## 2017-09-25 DIAGNOSIS — M9902 Segmental and somatic dysfunction of thoracic region: Secondary | ICD-10-CM | POA: Diagnosis not present

## 2017-09-25 DIAGNOSIS — M6080 Other myositis, unspecified site: Secondary | ICD-10-CM | POA: Diagnosis not present

## 2017-09-25 DIAGNOSIS — M9901 Segmental and somatic dysfunction of cervical region: Secondary | ICD-10-CM | POA: Diagnosis not present

## 2017-09-25 DIAGNOSIS — M9903 Segmental and somatic dysfunction of lumbar region: Secondary | ICD-10-CM | POA: Diagnosis not present

## 2017-09-25 DIAGNOSIS — M531 Cervicobrachial syndrome: Secondary | ICD-10-CM | POA: Diagnosis not present

## 2017-09-25 DIAGNOSIS — M9904 Segmental and somatic dysfunction of sacral region: Secondary | ICD-10-CM | POA: Diagnosis not present

## 2017-09-27 DIAGNOSIS — R7989 Other specified abnormal findings of blood chemistry: Secondary | ICD-10-CM | POA: Diagnosis not present

## 2017-10-09 ENCOUNTER — Emergency Department (INDEPENDENT_AMBULATORY_CARE_PROVIDER_SITE_OTHER)
Admission: EM | Admit: 2017-10-09 | Discharge: 2017-10-09 | Disposition: A | Discharge status: Home / Self Care | Payer: 59 | Source: Home / Self Care | Attending: Family Medicine | Admitting: Family Medicine

## 2017-10-09 ENCOUNTER — Other Ambulatory Visit: Payer: Self-pay

## 2017-10-09 DIAGNOSIS — R5383 Other fatigue: Secondary | ICD-10-CM

## 2017-10-09 DIAGNOSIS — R52 Pain, unspecified: Secondary | ICD-10-CM | POA: Diagnosis not present

## 2017-10-09 LAB — POCT CBC W AUTO DIFF (K'VILLE URGENT CARE)

## 2017-10-09 LAB — BASIC METABOLIC PANEL
BUN/Creatinine Ratio: 12 (calc) (ref 6–22)
BUN: 14 mg/dL (ref 7–25)
CO2: 28 mmol/L (ref 20–32)
Calcium: 9.8 mg/dL (ref 8.6–10.4)
Chloride: 102 mmol/L (ref 98–110)
Creat: 1.21 mg/dL — ABNORMAL HIGH (ref 0.50–0.99)
Glucose, Bld: 72 mg/dL (ref 65–99)
Potassium: 4.4 mmol/L (ref 3.5–5.3)
Sodium: 137 mmol/L (ref 135–146)

## 2017-10-09 LAB — EXTRA LAV TOP TUBE

## 2017-10-09 NOTE — ED Triage Notes (Signed)
Friday morning has a sore throat and ear ache.  Has nasal congestion and coughing, all mucous is clear.

## 2017-10-09 NOTE — Discharge Instructions (Signed)
°  You may take 500mg acetaminophen every 4-6 hours or in combination with ibuprofen 400-600mg every 6-8 hours as needed for pain, inflammation, and fever. ° °Be sure to drink at least eight 8oz glasses of water to stay well hydrated and get at least 8 hours of sleep at night, preferably more while sick.  ° °

## 2017-10-09 NOTE — ED Provider Notes (Signed)
[facility]    CSN: [id number] Arrival date & time: 10/09/17  1052     History   Chief Complaint Chief Complaint  Patient presents with  . Cough  . Nasal Congestion    HPI Mary Freeman is a 66 y.o. female.   HPI  Mary Freeman is a 66 y.o. female presenting to UC with c/o about 10 days of "not feeling well"  She reports about 1 week ago she developed severe body aches and fatigue, causing her to miss 4 days of work.  Last week she has some sinus congestion, sore throat, ear aching, mild cough but mucous has been clear.  She initially tried acetaminophen and ibuprofen but states that it did not help so she stopped taking it.  She did receive the flu vaccine this season a few weeks ago.  Denies fever, chills, n/v/d.  Denies cough or chest congestion for the last few days. 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  Denies known sick contacts or recent travel. Pt was see in ED on 09/20/17 for palpitations but pt states she believes that was due to anxiety and has not had any palpitations or chest pains since.  Her current symptoms started about 1 week after that visit to the ED.   Past Medical History:  Diagnosis Date  . Depression   . Headache    migraines  . PONV (postoperative nausea and vomiting)     There are no active problems to display for this patient.   Past Surgical History:  Procedure Laterality Date  . APPENDECTOMY    . BREAST EXCISIONAL BIOPSY Right    benign  . COLONOSCOPY    . KNEE ARTHROSCOPY Right    x3  . KNEE ARTHROSCOPY W/ MENISCAL REPAIR Left   . LUMBAR LAMINECTOMY  1986  . REDUCTION MAMMAPLASTY Bilateral    2006  . ROTATOR CUFF REPAIR Right 2012  . TONSILLECTOMY    . TUBAL LIGATION      OB History    No data available       Home Medications    Prior to Admission medications   Medication Sig Start Date End Date Taking?  Authorizing Provider  Calcium-Magnesium (CAL-MAG PO) Take 1 tablet by mouth daily.    [provider]  cholecalciferol (VITAMIN D) 1000 units tablet Take 1,000 Units by mouth daily.    [provider]  FLUoxetine (PROZAC) 20 MG tablet Take 20 mg by mouth daily.    [provider]  LORazepam (ATIVAN) 0.5 MG tablet Take 0.5 mg by mouth at bedtime as needed for sleep.    [provider]  Multiple Vitamins-Minerals (MULTIVITAMIN WITH MINERALS) tablet Take 1 tablet by mouth daily.    [provider]  rizatriptan (MAXALT) 10 MG tablet Take 10 mg by mouth as needed for migraine. May repeat in 2 hours if needed    [provider]  vitamin E 100 UNIT capsule Take 100 Units by mouth daily.    [provider]    Family History Family History  Problem Relation Age of Onset  . Cancer Father        lung  . Cancer Sister        breast  . Alcoholism Mother   . Alcoholism Brother   . Cancer Sister     Social History Social History   Tobacco  Use  . Smoking status: Never Smoker  . Smokeless tobacco: Never Used  Substance Use Topics  . Alcohol use: Yes    Comment: very rare   . Drug use: No     Allergies   Amitriptyline; Dilaudid [hydromorphone]; Diphenhydramine; Stadol [butorphanol]; Talwin [pentazocine]; Benadryl [diphenhydramine hcl]; Solu-medrol [methylprednisolone]; Butorphanol tartrate; Hydrocodone-acetaminophen; Hydromorphone hcl; Morphine and related; Other; Oxycodone-acetaminophen; Phenergan [promethazine]; and Sulfa antibiotics   Review of Systems Review of Systems  Constitutional: Positive for activity change, appetite change and fatigue. Negative for chills and fever.  HENT: Positive for congestion, rhinorrhea and sore throat. Negative for ear pain, trouble swallowing and voice change.   Respiratory: Positive for cough (minimal). Negative for chest tightness, shortness of breath and wheezing.   Cardiovascular: Negative  for chest pain and palpitations.  Gastrointestinal: Negative for abdominal pain, diarrhea, nausea and vomiting.  Musculoskeletal: Positive for arthralgias, back pain and myalgias.       Body aches  Skin: Negative for rash.  Neurological: Positive for weakness. Negative for dizziness, light-headedness, numbness and headaches.     Physical Exam Triage Vital Signs ED Triage Vitals  Enc Vitals Group     BP 10/09/17 1114 110/72     Pulse Rate 10/09/17 1114 87     Resp --      Temp --      Temp src --      SpO2 10/09/17 1114 100 %     Weight 10/09/17 1114 138 lb (62.6 kg)     Height 10/09/17 1114 5\' 7"  (1.702 m)     Head Circumference --      Peak Flow --      Pain Score 10/09/17 1115 0     Pain Loc --      Pain Edu? --      Excl. in [facility]? --    No data found.  Updated Vital Signs BP 110/72 (BP Location: Right Arm)   Pulse 87   Ht 5\' 7"  (1.702 m)   Wt 138 lb (62.6 kg)   SpO2 100%   BMI 21.61 kg/m   Visual Acuity Right Eye Distance:   Left Eye Distance:   Bilateral Distance:    Right Eye Near:   Left Eye Near:    Bilateral Near:     Physical Exam  Constitutional: She is oriented to person, place, and time. She appears well-developed and well-nourished. No distress.  Pt sitting in exam chair, appears well, non-toxic. NAD  HENT:  Head: Normocephalic and atraumatic.  Right Ear: Tympanic membrane normal.  Left Ear: Tympanic membrane normal.  Nose: Nose normal.  Mouth/Throat: Uvula is midline, oropharynx is clear and moist and mucous membranes are normal.  Eyes: EOM are normal.  Neck: Normal range of motion. Neck supple.  Cardiovascular: Normal rate and regular rhythm.  Pulmonary/Chest: Effort normal and breath sounds normal. No stridor. No respiratory distress. She has no wheezes. She has no rales.  Musculoskeletal: Normal range of motion.  Neurological: She is alert and oriented to person, place, and time.  Skin: Skin is warm and dry. No rash noted. She is not  diaphoretic.  Psychiatric: She has a normal mood and affect. Her behavior is normal.  Nursing note and vitals reviewed.    UC Treatments / Results  Labs (all labs ordered are listed, but only abnormal results are displayed) Labs Reviewed  BASIC METABOLIC PANEL  POCT CBC W AUTO DIFF ([place])    EKG  EKG Interpretation None  Radiology No results found.  Procedures Procedures (including critical care time)  Medications Ordered in UC Medications - No data to display   Initial Impression / Assessment and Plan / UC Course  I have reviewed the triage vital signs and the nursing notes.  Pertinent labs & imaging results that were available during my care of the patient were reviewed by me and considered in my medical decision making (see chart for details).     Pt c/o body aches and fatigue that kept her out of work for 4 days last week. Symptoms gradually improving. Lungs: CTAB Discussed labs and CXR for possible pneumonia, however, pt declined CXR as she has not had cough or chest congestion for several days. No fevers.   If pt had the flu, pt well outside recommended treatment window.  CBC: WNL BMP: pending  Symptoms likely viral in nature, reassuring that symptoms gradually improving. Encouraged fluids, rest, acetaminophen and ibuprofen. Encouraged pt try to increase food intake to normal amount to help increase her energy. F/u with PCP in 2-3 days if not improving.   Final Clinical Impressions(s) / UC Diagnoses   Final diagnoses:  Generalized body aches  Fatigue, unspecified type    ED Discharge Orders    None       Controlled Substance Prescriptions [place] Controlled Substance Registry consulted? Not Applicable   Tyrell Antonio 10/09/17 1351

## 2017-10-10 ENCOUNTER — Telehealth: Payer: Self-pay

## 2017-10-10 NOTE — Telephone Encounter (Signed)
Left message with lab results, and follow up information if any questions.

## 2017-10-16 DIAGNOSIS — M6080 Other myositis, unspecified site: Secondary | ICD-10-CM | POA: Diagnosis not present

## 2017-10-16 DIAGNOSIS — M9903 Segmental and somatic dysfunction of lumbar region: Secondary | ICD-10-CM | POA: Diagnosis not present

## 2017-10-16 DIAGNOSIS — M9904 Segmental and somatic dysfunction of sacral region: Secondary | ICD-10-CM | POA: Diagnosis not present

## 2017-10-16 DIAGNOSIS — M9901 Segmental and somatic dysfunction of cervical region: Secondary | ICD-10-CM | POA: Diagnosis not present

## 2017-10-16 DIAGNOSIS — M531 Cervicobrachial syndrome: Secondary | ICD-10-CM | POA: Diagnosis not present

## 2017-10-16 DIAGNOSIS — M9902 Segmental and somatic dysfunction of thoracic region: Secondary | ICD-10-CM | POA: Diagnosis not present

## 2017-10-26 MED FILL — ESTRADIOL 0.1 MG/GM CREA: 0.1 | 90 days supply | Qty: 43 | Fill #1

## 2017-10-26 MED FILL — FLUoxetine HCL 20 MG CAPS: 20 | 90 days supply | Qty: 90 | Fill #1

## 2017-11-15 DIAGNOSIS — M9904 Segmental and somatic dysfunction of sacral region: Secondary | ICD-10-CM | POA: Diagnosis not present

## 2017-11-15 DIAGNOSIS — M531 Cervicobrachial syndrome: Secondary | ICD-10-CM | POA: Diagnosis not present

## 2017-11-15 DIAGNOSIS — M9903 Segmental and somatic dysfunction of lumbar region: Secondary | ICD-10-CM | POA: Diagnosis not present

## 2017-11-15 DIAGNOSIS — M9902 Segmental and somatic dysfunction of thoracic region: Secondary | ICD-10-CM | POA: Diagnosis not present

## 2017-11-15 DIAGNOSIS — M9901 Segmental and somatic dysfunction of cervical region: Secondary | ICD-10-CM | POA: Diagnosis not present

## 2017-11-15 DIAGNOSIS — M6080 Other myositis, unspecified site: Secondary | ICD-10-CM | POA: Diagnosis not present

## 2017-11-20 DIAGNOSIS — M859 Disorder of bone density and structure, unspecified: Secondary | ICD-10-CM | POA: Diagnosis not present

## 2017-11-20 DIAGNOSIS — N183 Chronic kidney disease, stage 3 (moderate): Secondary | ICD-10-CM | POA: Diagnosis not present

## 2017-11-20 DIAGNOSIS — J019 Acute sinusitis, unspecified: Secondary | ICD-10-CM | POA: Diagnosis not present

## 2017-11-20 DIAGNOSIS — M542 Cervicalgia: Secondary | ICD-10-CM | POA: Diagnosis not present

## 2017-11-20 DIAGNOSIS — J40 Bronchitis, not specified as acute or chronic: Secondary | ICD-10-CM | POA: Diagnosis not present

## 2017-11-20 DIAGNOSIS — G43009 Migraine without aura, not intractable, without status migrainosus: Secondary | ICD-10-CM | POA: Diagnosis not present

## 2017-11-20 DIAGNOSIS — G479 Sleep disorder, unspecified: Secondary | ICD-10-CM | POA: Diagnosis not present

## 2017-11-20 DIAGNOSIS — F329 Major depressive disorder, single episode, unspecified: Secondary | ICD-10-CM | POA: Diagnosis not present

## 2017-12-07 DIAGNOSIS — M9902 Segmental and somatic dysfunction of thoracic region: Secondary | ICD-10-CM | POA: Diagnosis not present

## 2017-12-07 DIAGNOSIS — M9903 Segmental and somatic dysfunction of lumbar region: Secondary | ICD-10-CM | POA: Diagnosis not present

## 2017-12-07 DIAGNOSIS — M531 Cervicobrachial syndrome: Secondary | ICD-10-CM | POA: Diagnosis not present

## 2017-12-07 DIAGNOSIS — M6080 Other myositis, unspecified site: Secondary | ICD-10-CM | POA: Diagnosis not present

## 2017-12-07 DIAGNOSIS — M9901 Segmental and somatic dysfunction of cervical region: Secondary | ICD-10-CM | POA: Diagnosis not present

## 2017-12-07 DIAGNOSIS — M9904 Segmental and somatic dysfunction of sacral region: Secondary | ICD-10-CM | POA: Diagnosis not present

## 2017-12-25 DIAGNOSIS — M531 Cervicobrachial syndrome: Secondary | ICD-10-CM | POA: Diagnosis not present

## 2017-12-25 DIAGNOSIS — M6080 Other myositis, unspecified site: Secondary | ICD-10-CM | POA: Diagnosis not present

## 2017-12-25 DIAGNOSIS — M9903 Segmental and somatic dysfunction of lumbar region: Secondary | ICD-10-CM | POA: Diagnosis not present

## 2017-12-25 DIAGNOSIS — M9901 Segmental and somatic dysfunction of cervical region: Secondary | ICD-10-CM | POA: Diagnosis not present

## 2017-12-25 DIAGNOSIS — M9904 Segmental and somatic dysfunction of sacral region: Secondary | ICD-10-CM | POA: Diagnosis not present

## 2017-12-25 DIAGNOSIS — M9902 Segmental and somatic dysfunction of thoracic region: Secondary | ICD-10-CM | POA: Diagnosis not present

## 2018-01-19 DIAGNOSIS — M9904 Segmental and somatic dysfunction of sacral region: Secondary | ICD-10-CM | POA: Diagnosis not present

## 2018-01-19 DIAGNOSIS — M9903 Segmental and somatic dysfunction of lumbar region: Secondary | ICD-10-CM | POA: Diagnosis not present

## 2018-01-19 DIAGNOSIS — M9901 Segmental and somatic dysfunction of cervical region: Secondary | ICD-10-CM | POA: Diagnosis not present

## 2018-01-19 DIAGNOSIS — M6080 Other myositis, unspecified site: Secondary | ICD-10-CM | POA: Diagnosis not present

## 2018-01-19 DIAGNOSIS — M531 Cervicobrachial syndrome: Secondary | ICD-10-CM | POA: Diagnosis not present

## 2018-01-19 DIAGNOSIS — M9902 Segmental and somatic dysfunction of thoracic region: Secondary | ICD-10-CM | POA: Diagnosis not present

## 2018-01-31 MED FILL — FLUoxetine HCL 40 MG CAPS: 40 | 90 days supply | Qty: 90 | Fill #0

## 2018-01-31 MED FILL — traZODone HCL 50 MG TABS: 50 | 15 days supply | Qty: 30 | Fill #0

## 2018-01-31 MED FILL — SHIPPING COST: 1 days supply | Qty: 1 | Fill #0

## 2018-02-02 DIAGNOSIS — M9901 Segmental and somatic dysfunction of cervical region: Secondary | ICD-10-CM | POA: Diagnosis not present

## 2018-02-02 DIAGNOSIS — M9903 Segmental and somatic dysfunction of lumbar region: Secondary | ICD-10-CM | POA: Diagnosis not present

## 2018-02-02 DIAGNOSIS — M6080 Other myositis, unspecified site: Secondary | ICD-10-CM | POA: Diagnosis not present

## 2018-02-02 DIAGNOSIS — M9904 Segmental and somatic dysfunction of sacral region: Secondary | ICD-10-CM | POA: Diagnosis not present

## 2018-02-02 DIAGNOSIS — M9902 Segmental and somatic dysfunction of thoracic region: Secondary | ICD-10-CM | POA: Diagnosis not present

## 2018-02-02 DIAGNOSIS — M531 Cervicobrachial syndrome: Secondary | ICD-10-CM | POA: Diagnosis not present

## 2018-02-27 DIAGNOSIS — G43009 Migraine without aura, not intractable, without status migrainosus: Secondary | ICD-10-CM | POA: Diagnosis not present

## 2018-02-27 DIAGNOSIS — G479 Sleep disorder, unspecified: Secondary | ICD-10-CM | POA: Diagnosis not present

## 2018-02-27 DIAGNOSIS — Z0000 Encounter for general adult medical examination without abnormal findings: Secondary | ICD-10-CM | POA: Diagnosis not present

## 2018-02-27 DIAGNOSIS — N183 Chronic kidney disease, stage 3 (moderate): Secondary | ICD-10-CM | POA: Diagnosis not present

## 2018-02-27 DIAGNOSIS — M542 Cervicalgia: Secondary | ICD-10-CM | POA: Diagnosis not present

## 2018-02-27 DIAGNOSIS — M859 Disorder of bone density and structure, unspecified: Secondary | ICD-10-CM | POA: Diagnosis not present

## 2018-02-27 MED FILL — SHIPPING COST: 1 days supply | Qty: 1 | Fill #1

## 2018-02-27 MED FILL — PROGESTERONE MICRONIZED 100: 100 | 90 days supply | Qty: 90 | Fill #3

## 2018-03-26 DIAGNOSIS — M9902 Segmental and somatic dysfunction of thoracic region: Secondary | ICD-10-CM | POA: Diagnosis not present

## 2018-03-26 DIAGNOSIS — M9903 Segmental and somatic dysfunction of lumbar region: Secondary | ICD-10-CM | POA: Diagnosis not present

## 2018-03-26 DIAGNOSIS — M6080 Other myositis, unspecified site: Secondary | ICD-10-CM | POA: Diagnosis not present

## 2018-03-26 DIAGNOSIS — M9901 Segmental and somatic dysfunction of cervical region: Secondary | ICD-10-CM | POA: Diagnosis not present

## 2018-03-26 DIAGNOSIS — M531 Cervicobrachial syndrome: Secondary | ICD-10-CM | POA: Diagnosis not present

## 2018-03-26 DIAGNOSIS — M9904 Segmental and somatic dysfunction of sacral region: Secondary | ICD-10-CM | POA: Diagnosis not present

## 2018-04-13 ENCOUNTER — Ambulatory Visit (INDEPENDENT_AMBULATORY_CARE_PROVIDER_SITE_OTHER): Payer: 59 | Admitting: Orthopaedic Surgery

## 2018-04-25 MED FILL — SHIPPING COST: 1 days supply | Qty: 1 | Fill #2

## 2018-04-25 MED FILL — NAPROXEN 500 MG TABLET: 500 | 30 days supply | Qty: 60 | Fill #0

## 2018-04-25 MED FILL — FLUoxetine HCL 40 MG CAPS: 40 | 90 days supply | Qty: 90 | Fill #0

## 2018-05-08 DIAGNOSIS — Z6821 Body mass index (BMI) 21.0-21.9, adult: Secondary | ICD-10-CM | POA: Diagnosis not present

## 2018-05-08 DIAGNOSIS — Z01419 Encounter for gynecological examination (general) (routine) without abnormal findings: Secondary | ICD-10-CM | POA: Diagnosis not present

## 2018-05-10 ENCOUNTER — Other Ambulatory Visit: Payer: Self-pay | Admitting: Nurse Practitioner

## 2018-05-10 DIAGNOSIS — Z01419 Encounter for gynecological examination (general) (routine) without abnormal findings: Secondary | ICD-10-CM | POA: Diagnosis not present

## 2018-05-10 DIAGNOSIS — R5381 Other malaise: Secondary | ICD-10-CM

## 2018-05-11 ENCOUNTER — Other Ambulatory Visit: Payer: Self-pay | Admitting: Nurse Practitioner

## 2018-05-11 DIAGNOSIS — Z1231 Encounter for screening mammogram for malignant neoplasm of breast: Secondary | ICD-10-CM

## 2018-05-17 ENCOUNTER — Other Ambulatory Visit: Payer: Self-pay | Admitting: Family Medicine

## 2018-05-17 DIAGNOSIS — M8580 Other specified disorders of bone density and structure, unspecified site: Secondary | ICD-10-CM

## 2018-05-17 DIAGNOSIS — Z1231 Encounter for screening mammogram for malignant neoplasm of breast: Secondary | ICD-10-CM

## 2018-06-04 MED FILL — SHIPPING COST: 1 days supply | Qty: 1 | Fill #3

## 2018-06-04 MED FILL — LORazepam 1 MG TABS: 1 | 30 days supply | Qty: 90 | Fill #0

## 2018-06-08 DIAGNOSIS — M531 Cervicobrachial syndrome: Secondary | ICD-10-CM | POA: Diagnosis not present

## 2018-06-08 DIAGNOSIS — M9904 Segmental and somatic dysfunction of sacral region: Secondary | ICD-10-CM | POA: Diagnosis not present

## 2018-06-08 DIAGNOSIS — M9902 Segmental and somatic dysfunction of thoracic region: Secondary | ICD-10-CM | POA: Diagnosis not present

## 2018-06-08 DIAGNOSIS — M9903 Segmental and somatic dysfunction of lumbar region: Secondary | ICD-10-CM | POA: Diagnosis not present

## 2018-06-08 DIAGNOSIS — M6080 Other myositis, unspecified site: Secondary | ICD-10-CM | POA: Diagnosis not present

## 2018-06-08 DIAGNOSIS — M9901 Segmental and somatic dysfunction of cervical region: Secondary | ICD-10-CM | POA: Diagnosis not present

## 2018-06-24 ENCOUNTER — Emergency Department (INDEPENDENT_AMBULATORY_CARE_PROVIDER_SITE_OTHER)
Admission: EM | Admit: 2018-06-24 | Discharge: 2018-06-24 | Disposition: A | Discharge status: Home / Self Care | Payer: 59 | Source: Home / Self Care

## 2018-06-24 ENCOUNTER — Encounter: Payer: Self-pay | Admitting: Emergency Medicine

## 2018-06-24 DIAGNOSIS — S80871A Other superficial bite, right lower leg, initial encounter: Secondary | ICD-10-CM

## 2018-06-24 DIAGNOSIS — W5501XA Bitten by cat, initial encounter: Secondary | ICD-10-CM

## 2018-06-24 MED ORDER — AMOXICILLIN-POT CLAVULANATE 875-125 MG PO TABS: 1.0000 | ORAL_TABLET | Freq: Two times a day (BID) | ORAL | 0 refills | Status: DC

## 2018-06-24 MED ORDER — ACETAMINOPHEN-CODEINE #3 300-30 MG PO TABS
1.0000 | ORAL_TABLET | Freq: Four times a day (QID) | ORAL | 0 refills | Status: DC | PRN
Start: 2018-06-24 — End: 2019-05-25

## 2018-06-24 MED ORDER — FLUCONAZOLE 150 MG PO TABS: 150.0000 mg | ORAL_TABLET | Freq: Once | ORAL | 0 refills | Status: AC

## 2018-06-24 MED ORDER — ACETAMINOPHEN-CODEINE #3 300-30 MG PO TABS: 1.0000 | ORAL_TABLET | Freq: Four times a day (QID) | ORAL | 0 refills | Status: DC | PRN

## 2018-06-24 NOTE — Discharge Instructions (Addendum)
Follow-up with PCP within 3 days for evaluation of cat bite wound.  Continue to evaluate the site for signs of worsening infection.  I have attached educational information regarding signs and symptoms of worsening cellulitis infection.  Take all medication as directed. Your Tdap vaccination is current and up-to-date.  Do not take Tylenol # 3 with your prescribed Ativan as medications taken together can produce severe sedation and posses a risk for overdose.

## 2018-06-24 NOTE — ED Triage Notes (Signed)
Pt c/o cat bite on left lower leg yesterday, cleaned with peroxide and neosporin, now area is swollen and painful.  Tdap up to date.

## 2018-06-24 NOTE — ED Provider Notes (Signed)
[name]    CSN: [id number] Arrival date & time: 06/24/18  1303     History   Chief Complaint Chief Complaint  Patient presents with  . Animal Bite    HPI Mary Freeman is a 67 y.o. female presents for evaluation of cat bite. Cat is patient's pet of 15 years. She was attempting to move the cat out of way when the cat bit her on the lower leg causing 4 puncture wounds. Bite occurred x 1 day ago. She immediately cleansed the wounds with peroxide and applied antimicrobial antibiotic. No prior rabies vaccination for cat as it lives indoors. Her last TDAP 2018. Denies fever, chills, nausea, or vomiting. Past Medical History:  Diagnosis Date  . Depression   . Headache    migraines  . PONV (postoperative nausea and vomiting)     There are no active problems to display for this patient.   Past Surgical History:  Procedure Laterality Date  . APPENDECTOMY    . BREAST EXCISIONAL BIOPSY Right    benign  . COLONOSCOPY    . KNEE ARTHROSCOPY Right    x3  . KNEE ARTHROSCOPY W/ MENISCAL REPAIR Left   . LUMBAR LAMINECTOMY  1986  . REDUCTION MAMMAPLASTY Bilateral    2006  . ROTATOR CUFF REPAIR Right 2012  . TONSILLECTOMY    . TUBAL LIGATION      OB History   None      Home Medications    Prior to Admission medications   Medication Sig Start Date End Date Taking? Authorizing Provider  Calcium-Magnesium (CAL-MAG PO) Take 1 tablet by mouth daily.    [provider]  cholecalciferol (VITAMIN D) 1000 units tablet Take 1,000 Units by mouth daily.    [provider]  FLUoxetine (PROZAC) 20 MG tablet Take 20 mg by mouth daily.    [provider]  LORazepam (ATIVAN) 0.5 MG tablet Take 0.5 mg by mouth at bedtime as needed for sleep.    [provider]  Multiple Vitamins-Minerals (MULTIVITAMIN WITH MINERALS) tablet Take 1 tablet by mouth daily.    [provider]  rizatriptan (MAXALT) 10 MG tablet Take 10 mg by mouth as  needed for migraine. May repeat in 2 hours if needed    [provider]  vitamin E 100 UNIT capsule Take 100 Units by mouth daily.    [provider]    Family History Family History  Problem Relation Age of Onset  . Cancer Father        lung  . Cancer Sister        breast  . Alcoholism Mother   . Alcoholism Brother   . Cancer Sister     Social History Social History   Tobacco Use  . Smoking status: Never Smoker  . Smokeless tobacco: Never Used  Substance Use Topics  . Alcohol use: Yes    Comment: very rare   . Drug use: No     Allergies   Amitriptyline; Dilaudid [hydromorphone]; Diphenhydramine; Stadol [butorphanol]; Talwin [pentazocine]; Benadryl [diphenhydramine hcl]; Solu-medrol [methylprednisolone]; Butorphanol tartrate; Hydrocodone-acetaminophen; Hydromorphone hcl; Morphine and related; Other; Oxycodone-acetaminophen; Phenergan [promethazine]; and Sulfa antibiotics   Review of Systems Review of Systems Pertinent negatives listed in HPI Physical Exam Triage Vital Signs ED Triage Vitals  Enc Vitals Group     BP 06/24/18 1351 107/62     Pulse Rate 06/24/18 1351 72     Resp --      Temp 06/24/18 1351 97.8  F (36.6 C)     Temp Source 06/24/18 1351 Oral     SpO2 06/24/18 1351 100 %     Weight 06/24/18 1355 140 lb (63.5 kg)     Height 06/24/18 1355 5\' 8"  (1.727 m)     Head Circumference --      Peak Flow --      Pain Score 06/24/18 1355 8     Pain Loc --      Pain Edu? --      Excl. in [place]? --    No data found.  Updated Vital Signs BP 107/62 (BP Location: Right Arm)   Pulse 72   Temp 97.8 F (36.6 C) (Oral)   Ht 5\' 8"  (1.727 m)   Wt 140 lb (63.5 kg)   SpO2 100%   BMI 21.29 kg/m   Visual Acuity Right Eye Distance:   Left Eye Distance:   Bilateral Distance:    Right Eye Near:   Left Eye Near:    Bilateral Near:     Physical Exam  Cardiovascular: Regular rhythm.  Pulses:      Dorsalis pedis pulses are 2+ on the right side,  and 2+ on the left side.  Skin: Skin is warm. Lesion noted.        UC Treatments / Results  Labs (all labs ordered are listed, but only abnormal results are displayed) Labs Reviewed - No data to display  EKG None  Radiology No results found.  Procedures Procedures (including critical care time)  Medications Ordered in UC Medications - No data to display  Initial Impression / Assessment and Plan / UC Course  I have reviewed the triage vital signs and the nursing notes.  Pertinent labs & imaging results that were available during my care of the patient were reviewed by me and considered in my medical decision making (see chart for details).      Patient presents today with 4 puncture wounds with surrounding erythema status post cat bite.  The cat is known to patient.  Patient is mostly concerned for cellulitis infection and would like management of her current pain.  Will treat with an extended course of Augmentin 875-125 mg twice daily for total of 14 days.  Tetanus vaccine is current therefore will not readminister today.  We will also supply very limited quantity of Tylenol #3  Today. Patient has a current active prescription for Ativan which she reports she takes only for insomnia however strict instructions given not to take both medications at the same time due to adverse risk for overdose and medication toxicity.  Patient is to follow-up with either primary care provider in 3 days and or here at urgent care for wound follow-up.  If symptoms significantly worsen she is to go to the emergency department.  Patient verbalizes understanding. Final Clinical Impressions(s) / UC Diagnoses   Final diagnoses:  Cat bite, initial encounter     Discharge Instructions     Follow-up with PCP within 3 days for evaluation of cat bite wound.  Continue to evaluate the site for signs of worsening infection.  I have attached educational information regarding signs and symptoms of worsening  cellulitis infection.  Take all medication as directed. Your Tdap vaccination is current and up-to-date.  Do not take Tylenol # 3 with your prescribed Ativan as medications taken together can produce severe sedation and posses a risk for overdose.     ED Prescriptions    Medication Sig Dispense Auth. Provider  amoxicillin-clavulanate (AUGMENTIN) 875-125 MG tablet Take 1 tablet by mouth 2 (two) times daily for 14 days. 28 tablet Scot Jun, FNP   fluconazole (DIFLUCAN) 150 MG tablet Take 1 tablet (150 mg total) by mouth once for 1 dose. Repeat if needed 2 tablet Scot Jun, FNP   acetaminophen-codeine (TYLENOL #3) 300-30 MG tablet  (Status: Discontinued) Take 1-2 tablets by mouth every 6 (six) hours as needed for moderate pain. 15 tablet Scot Jun, FNP   acetaminophen-codeine (TYLENOL #3) 300-30 MG tablet Take 1-2 tablets by mouth every 6 (six) hours as needed for moderate pain. 15 tablet Scot Jun, FNP     Controlled Substance Prescriptions [place] Controlled Substance Registry consulted? Yes, I have consulted the [place] Controlled Substances Registry for this patient, and feel the risk/benefit ratio today is favorable for proceeding with this prescription for a controlled substance.   Scot Jun, FNP 06/26/18 [id number]

## 2018-06-25 ENCOUNTER — Telehealth: Payer: Self-pay

## 2018-06-25 NOTE — Telephone Encounter (Signed)
Pt called and said that her leg is not any worse, but not any better.  She is asking how long before her antibiotics will help.  Spoke with K. Threasa Alpha, PA-C, and she said she needs a recheck tomorrow either with PCP or UC unless it is significantly worse, then needs to go to ER.   Notified patient of advice.

## 2018-06-26 ENCOUNTER — Other Ambulatory Visit: Payer: Self-pay

## 2018-06-26 ENCOUNTER — Emergency Department (HOSPITAL_BASED_OUTPATIENT_CLINIC_OR_DEPARTMENT_OTHER): Payer: 59

## 2018-06-26 ENCOUNTER — Encounter (HOSPITAL_BASED_OUTPATIENT_CLINIC_OR_DEPARTMENT_OTHER): Payer: Self-pay | Admitting: Emergency Medicine

## 2018-06-26 ENCOUNTER — Emergency Department (HOSPITAL_BASED_OUTPATIENT_CLINIC_OR_DEPARTMENT_OTHER)
Admission: EM | Admit: 2018-06-26 | Discharge: 2018-06-26 | Disposition: A | Discharge status: Home / Self Care | Payer: 59 | Attending: Emergency Medicine | Admitting: Emergency Medicine

## 2018-06-26 DIAGNOSIS — W5501XA Bitten by cat, initial encounter: Secondary | ICD-10-CM | POA: Diagnosis not present

## 2018-06-26 DIAGNOSIS — R2241 Localized swelling, mass and lump, right lower limb: Secondary | ICD-10-CM | POA: Diagnosis present

## 2018-06-26 DIAGNOSIS — Y92009 Unspecified place in unspecified non-institutional (private) residence as the place of occurrence of the external cause: Secondary | ICD-10-CM | POA: Insufficient documentation

## 2018-06-26 DIAGNOSIS — T148XXA Other injury of unspecified body region, initial encounter: Secondary | ICD-10-CM

## 2018-06-26 DIAGNOSIS — Y939 Activity, unspecified: Secondary | ICD-10-CM | POA: Insufficient documentation

## 2018-06-26 DIAGNOSIS — Z79899 Other long term (current) drug therapy: Secondary | ICD-10-CM | POA: Diagnosis not present

## 2018-06-26 DIAGNOSIS — R600 Localized edema: Secondary | ICD-10-CM | POA: Diagnosis not present

## 2018-06-26 DIAGNOSIS — Y999 Unspecified external cause status: Secondary | ICD-10-CM | POA: Insufficient documentation

## 2018-06-26 DIAGNOSIS — S81851A Open bite, right lower leg, initial encounter: Secondary | ICD-10-CM | POA: Diagnosis not present

## 2018-06-26 DIAGNOSIS — L03115 Cellulitis of right lower limb: Secondary | ICD-10-CM | POA: Diagnosis not present

## 2018-06-26 DIAGNOSIS — L02419 Cutaneous abscess of limb, unspecified: Secondary | ICD-10-CM | POA: Diagnosis not present

## 2018-06-26 LAB — COMPREHENSIVE METABOLIC PANEL
ALT: 20 U/L (ref 0–44)
ANION GAP: 7 (ref 5–15)
AST: 18 U/L (ref 15–41)
Albumin: 3.8 g/dL (ref 3.5–5.0)
Alkaline Phosphatase: 51 U/L (ref 38–126)
BUN: 12 mg/dL (ref 8–23)
CO2: 26 mmol/L (ref 22–32)
CREATININE: 0.9 mg/dL (ref 0.44–1.00)
Calcium: 9.3 mg/dL (ref 8.9–10.3)
Chloride: 102 mmol/L (ref 98–111)
Glucose, Bld: 95 mg/dL (ref 70–99)
POTASSIUM: 4.3 mmol/L (ref 3.5–5.1)
SODIUM: 135 mmol/L (ref 135–145)
Total Bilirubin: 0.8 mg/dL (ref 0.3–1.2)
Total Protein: 7.4 g/dL (ref 6.5–8.1)

## 2018-06-26 LAB — CBC WITH DIFFERENTIAL/PLATELET
Basophils Absolute: 0 10*3/uL (ref 0.0–0.1)
Basophils Relative: 0 %
EOS ABS: 0.2 10*3/uL (ref 0.0–0.7)
EOS PCT: 2 %
HCT: 41 % (ref 36.0–46.0)
Hemoglobin: 14.4 g/dL (ref 12.0–15.0)
LYMPHS ABS: 1.7 10*3/uL (ref 0.7–4.0)
Lymphocytes Relative: 19 %
MCH: 31.6 pg (ref 26.0–34.0)
MCHC: 35.1 g/dL (ref 30.0–36.0)
MCV: 89.9 fL (ref 78.0–100.0)
MONO ABS: 0.8 10*3/uL (ref 0.1–1.0)
Monocytes Relative: 9 %
Neutro Abs: 6.3 10*3/uL (ref 1.7–7.7)
Neutrophils Relative %: 70 %
PLATELETS: 265 10*3/uL (ref 150–400)
RBC: 4.56 MIL/uL (ref 3.87–5.11)
RDW: 13.5 % (ref 11.5–15.5)
WBC: 9.1 10*3/uL (ref 4.0–10.5)

## 2018-06-26 LAB — I-STAT CG4 LACTIC ACID, ED: LACTIC ACID, VENOUS: 0.67 mmol/L (ref 0.5–1.9)

## 2018-06-26 MED ORDER — DOXYCYCLINE HYCLATE 100 MG PO TABS
100.0000 mg | ORAL_TABLET | Freq: Once | ORAL | Status: AC
  Administered 2018-06-26: 100 mg via ORAL
  Filled 2018-06-26: qty 1

## 2018-06-26 MED ORDER — DOXYCYCLINE HYCLATE 100 MG PO CAPS: 100.0000 mg | ORAL_CAPSULE | Freq: Two times a day (BID) | ORAL | 0 refills | Status: DC

## 2018-06-26 MED ORDER — SODIUM CHLORIDE 0.9 % IV BOLUS
1000.0000 mL | Freq: Once | INTRAVENOUS | Status: AC
  Administered 2018-06-26: 1000 mL via INTRAVENOUS

## 2018-06-26 MED ORDER — SODIUM CHLORIDE 0.9 % IV SOLN
INTRAVENOUS | Status: DC | PRN
  Administered 2018-06-26: 15:00:00 via INTRAVENOUS

## 2018-06-26 MED ORDER — IOPAMIDOL (ISOVUE-300) INJECTION 61%
100.0000 mL | Freq: Once | INTRAVENOUS | Status: AC | PRN
  Administered 2018-06-26: 100 mL via INTRAVENOUS

## 2018-06-26 MED ORDER — SODIUM CHLORIDE 0.9 % IV SOLN
3.0000 g | Freq: Once | INTRAVENOUS | Status: AC
  Administered 2018-06-26: 3 g via INTRAVENOUS
  Filled 2018-06-26: qty 3

## 2018-06-26 MED ORDER — FENTANYL CITRATE (PF) 100 MCG/2ML IJ SOLN
50.0000 ug | Freq: Once | INTRAMUSCULAR | Status: AC
  Administered 2018-06-26: 50 ug via INTRAVENOUS
  Filled 2018-06-26: qty 2

## 2018-06-26 MED FILL — DOXYCYCLINE HYCLATE 100 MG: 100 | 10 days supply | Qty: 20 | Fill #0

## 2018-06-26 NOTE — Discharge Instructions (Addendum)
You were seen here today for a wound infection after a cat bite. You are noted to have cellulitis, also known as a skin infection. The infected area is usually red and sore. This condition occurs most often in the arms and lower legs. It is very important to get treated for this condition. Your CT scan shows signs of cellulitis without involvement of the bone, muscle or fascia. There was no evidence of an abscess. You white count and lactic acid were normal today. You have blood cultures pending.  We discussed changing your antibiotic and trying a trial of outpatient therapy vs admission today. Together we agreed a trial of outpatient therapy with change of your antibiotic was appropriate. Please note this medication can cause sensitivity to the sun and you should wear sunscreen while using this medication.  Pick up your medication at the pharmacy  Stop taking the Augmentin. Start taking the doxycycline as prescribed. Please take all of your antibiotics until finished!   You may develop abdominal discomfort or diarrhea from the antibiotic.  You may help offset this with probiotics which you can buy or get in yogurt. Do not eat or take the probiotics until 2 hours after your antibiotic. Do not take your medicine if develop an itchy rash, swelling in your mouth or lips, or difficulty breathing.  You will need to return in 2 days for a wound recheck.  Continue pain medication at home.  Apply warm compresses to the area 4 times per day.  Raise (elevate) the infected area above the level of your heart while you are sitting or lying down.  Return for re-evaluation if: You have a fever. Your symptoms do not get better after 1-2 days of treatment. Your redness spreads outside the marked line If you develop streaking of the redness up or down your leg.  Your bone or joint under the infected area starts to hurt after the skin has healed. Your infection comes back. This can happen in the same area or another  area. You have a swollen bump in the infected area. You have new symptoms. You feel ill and also have muscle aches and pains. Get help right away if: Your symptoms get worse. You feel very sleepy. You throw up (vomit) or have watery poop (diarrhea) for a long time. There are red streaks coming from the infected area. Your red area gets larger. Your red area turns darker.

## 2018-06-26 NOTE — ED Triage Notes (Signed)
Reports cat bite to right lower leg.  States being treated with augmentin for the past 5 days but states this is worsening.

## 2018-06-26 NOTE — ED Notes (Signed)
Pt. Has noted wound with redness from cat bite several days ago.  Pt. In no distress and has had pain meds.

## 2018-06-26 NOTE — ED Provider Notes (Signed)
[facility] DEPART[facility] Provider Note   CSN: [id number] Arrival date & time: 06/26/18  1209     History   Chief Complaint Chief Complaint  Patient presents with  . Animal Bite    HPI Mary Freeman is a 67 y.o. female who presents emergency department today for redness after animal bite.  Patient reports on Saturday evening she was bit by her for indoor cats.  The animal was not vaccinated against rabies as it is an indoor cat.  This is the patient's cat.  She was seen in urgent care and prescribed Augmentin.  She notes she has taken 5 doses of this but since has developed redness, heat and some edema surrounding the puncture wounds.  She was seen by her PCP that was concerned and sent her over for further evaluation.  Patient states her pain is constant.  The patient states that she has not been able to work as it is painful to bear weight.  She denies any joint swelling or difficulty with range of motion of the joints above or below the wound.  No fever, nausea or vomiting at home.  She denies history of immunosuppression.  She is not on chronic steroids and denies history of diabetes or IV drug use.  Patient has been taking Tylenol 3 for her symptoms without any relief.    HPI  Past Medical History:  Diagnosis Date  . Depression   . Headache    migraines  . PONV (postoperative nausea and vomiting)     There are no active problems to display for this patient.   Past Surgical History:  Procedure Laterality Date  . APPENDECTOMY    . BREAST EXCISIONAL BIOPSY Right    benign  . COLONOSCOPY    . KNEE ARTHROSCOPY Right    x3  . KNEE ARTHROSCOPY W/ MENISCAL REPAIR Left   . LUMBAR LAMINECTOMY  1986  . REDUCTION MAMMAPLASTY Bilateral    2006  . ROTATOR CUFF REPAIR Right 2012  . TONSILLECTOMY    . TUBAL LIGATION       OB History   None      Home Medications    Prior to Admission medications   Medication Sig Start Date End Date Taking? Authorizing  Provider  acetaminophen-codeine (TYLENOL #3) 300-30 MG tablet Take 1-2 tablets by mouth every 6 (six) hours as needed for moderate pain. 06/24/18   Scot Jun, FNP  amoxicillin-clavulanate (AUGMENTIN) 875-125 MG tablet Take 1 tablet by mouth 2 (two) times daily for 14 days. 06/24/18 07/08/18  Scot Jun, FNP  Calcium-Magnesium (CAL-MAG PO) Take 1 tablet by mouth daily.    [provider]  cholecalciferol (VITAMIN D) 1000 units tablet Take 1,000 Units by mouth daily.    [provider]  FLUoxetine (PROZAC) 20 MG tablet Take 20 mg by mouth daily.    [provider]  LORazepam (ATIVAN) 0.5 MG tablet Take 0.5 mg by mouth at bedtime as needed for sleep.    [provider]  Multiple Vitamins-Minerals (MULTIVITAMIN WITH MINERALS) tablet Take 1 tablet by mouth daily.    [provider]  rizatriptan (MAXALT) 10 MG tablet Take 10 mg by mouth as needed for migraine. May repeat in 2 hours if needed    [provider]  vitamin E 100 UNIT capsule Take 100 Units by mouth daily.    [provider]    Family History Family History  Problem Relation Age of Onset  . Cancer  Father        lung  . Cancer Sister        breast  . Alcoholism Mother   . Alcoholism Brother   . Cancer Sister     Social History Social History   Tobacco Use  . Smoking status: Never Smoker  . Smokeless tobacco: Never Used  Substance Use Topics  . Alcohol use: Yes    Comment: very rare   . Drug use: No     Allergies   Amitriptyline; Dilaudid [hydromorphone]; Diphenhydramine; Stadol [butorphanol]; Talwin [pentazocine]; Benadryl [diphenhydramine hcl]; Solu-medrol [methylprednisolone]; Butorphanol tartrate; Hydrocodone-acetaminophen; Hydromorphone hcl; Morphine and related; Other; Oxycodone-acetaminophen; Phenergan [promethazine]; and Sulfa antibiotics   Review of Systems Review of Systems  All other systems reviewed and are negative.    Physical  Exam Updated Vital Signs BP (!) 111/44 (BP Location: Right Arm)   Pulse 78   Temp 98.2 F (36.8 C) (Oral)   Resp 16   Ht 5\' 8"  (1.727 m)   Wt 63.5 kg (140 lb)   SpO2 100%   BMI 21.29 kg/m   Physical Exam  Constitutional: She appears well-developed and well-nourished.  HENT:  Head: Normocephalic and atraumatic.  Right Ear: External ear normal.  Left Ear: External ear normal.  Eyes: Conjunctivae are normal. Right eye exhibits no discharge. Left eye exhibits no discharge. No scleral icterus.  Cardiovascular:  Pulses:      Radial pulses are 2+ on the right side, and 2+ on the left side.       Dorsalis pedis pulses are 2+ on the right side, and 2+ on the left side.       Posterior tibial pulses are 2+ on the right side, and 2+ on the left side.  Calves symmetric in size b/l. Negative homan's test.   Pulmonary/Chest: Effort normal. No respiratory distress.  Musculoskeletal:       Right knee: Normal. She exhibits normal range of motion and no swelling. No tenderness found.       Right ankle: Normal. She exhibits normal range of motion. No tenderness. Achilles tendon normal.  Patient with normal range of motion of right knee and right ankle.  No joint swelling, overlying erythema or heat.  No tenderness palpation of Achilles tendon. Neurovascularly intact distally. Compartments soft above and below affected joint.   Neurological: She is alert. She has normal strength. No sensory deficit. GCS eye subscore is 4. GCS verbal subscore is 5. GCS motor subscore is 6.  Able gait.   Skin: Skin is warm and dry. There is erythema. No pallor.  There is a 4cm x 6cm area of erythema, heat and mild edema to the right lower leg. No focal fluctuance. 4 puncture wounds noted as seen in picture below. Small pustule surronding upper most medial puncture wound.  Psychiatric: She has a normal mood and affect.  Nursing note and vitals reviewed.        ED Treatments / Results  Labs (all labs ordered  are listed, but only abnormal results are displayed) Labs Reviewed  CULTURE, BLOOD (ROUTINE X 2)  CULTURE, BLOOD (ROUTINE X 2)  CBC WITH DIFFERENTIAL/PLATELET  COMPREHENSIVE METABOLIC PANEL  I-STAT CG4 LACTIC ACID, ED  I-STAT CG4 LACTIC ACID, ED    EKG None  Radiology Ct Extremity Lower Right W Contrast  Result Date: 06/26/2018 CLINICAL DATA:  Patient status post cat bite of the right lower leg 1 week ago with onset of redness and swelling which is worst about the lateral aspect  of the ankle. Initial encounter. EXAM: CT OF THE LOWER RIGHT EXTREMITY WITH CONTRAST TECHNIQUE: Multidetector CT imaging of the lower right extremity was performed according to the standard protocol following intravenous contrast administration. COMPARISON:  None. CONTRAST:  100 mL ISOVUE-300 IOPAMIDOL (ISOVUE-300) INJECTION 61% FINDINGS: Bones/Joint/Cartilage No acute bony or joint abnormality is seen. No periosteal reaction or bony destructive change. Small benign enchondroma or hemangioma in the distal femur is incidentally noted. Ligaments Suboptimally assessed by CT. Muscles and Tendons Intact. No gas within muscle or tracking along fascial planes is identified. Soft tissues Subcutaneous edema about the ankle is more notable on the lateral side. No abscess is identified. There is no joint effusion. IMPRESSION: Subcutaneous edema most notable about the ankle on the lateral side is compatible with cellulitis given the patient's history. Negative for osteomyelitis, fasciitis, myositis or septic joint. Electronically Signed   By: Inge Rise M.D.   On: 06/26/2018 15:28    Procedures Procedures (including critical care time) EMERGENCY DEPARTMENT US SOFT TISSUE INTERPRETATION "Study: Limited Soft Tissue Ultrasound"  INDICATIONS: Pain and Soft tissue infection Multiple views of the body part were obtained in real-time with a multi-frequency linear probe  PERFORMED BY: Myself IMAGES ARCHIVED?: No SIDE:Right    BODY PART:Lower extremity INTERPRETATION:  No abcess noted and Cellulitis present    Medications Ordered in ED Medications  0.9 %  sodium chloride infusion ( Intravenous Stopped 06/26/18 1608)  fentaNYL (SUBLIMAZE) injection 50 mcg (50 mcg Intravenous Given 06/26/18 1421)  Ampicillin-Sulbactam (UNASYN) 3 g in sodium chloride 0.9 % 100 mL IVPB (0 g Intravenous Stopped 06/26/18 1610)  sodium chloride 0.9 % bolus 1,000 mL (0 mLs Intravenous Stopped 06/26/18 1610)  iopamidol (ISOVUE-300) 61 % injection 100 mL (100 mLs Intravenous Contrast Given 06/26/18 1452)  doxycycline (VIBRA-TABS) tablet 100 mg (100 mg Oral Given 06/26/18 1643)     Initial Impression / Assessment and Plan / ED Course  I have reviewed the triage vital signs and the nursing notes.  Pertinent labs & imaging results that were available during my care of the patient were reviewed by me and considered in my medical decision making (see chart for details).     67 y.o. female without history of immunocompromise presenting from PCPs office for redness of her right lower leg after a cat bite that occurred on Saturday evening.  Patient has been on Augmentin for 2.5 days (5 doses).  She denies any systemic symptoms including fever, nausea, vomiting.  He has normal sounds on presentation.  She is neurovascularly intact on exam and compartments are soft.  Exam is with 4 puncture wounds as noted above with surrounding area of cellulitis.  One puncture wound does appear to have a underlying pustule.  Bedside ultrasound done without evidence of abscess.  Will obtain blood cultures, basic labs and CT to evaluate.  1 dose of IV antibiotics given in the department.  Case discussed with my attending, Dr. Laverta Baltimore who is in agreement.  Patient's labs completely unremarkable.  She is without a white count.  Lactic acid within normal limits.  No evidence of sepsis.  CT scan shows cellulitis without evidence of abscess, myositis, fasciitis, osteomyelitis or  septic joint.  Patient's pain is currently controlled and she is ambulatory in the department.  4:33 PM I had a long discussion with the patient's about risks and benefits of being brought into the hospital for observation for IV antibiotics versus switching antibiotics from Augmentin to doxycycline with close follow-up in 2 days for  wound recheck.  Patient states she would like to have antibiotics switched and follow-up in 2 days for wound recheck. She understands risks and benefits. 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  Her vital signs are stable in the department.  Patient's cellulitis was marked and patient was told to return if redness spreads outside this area or she develops any streaking of redness, fever, or vomiting at home. She is to continue with home pain medication. recommended warm compresses. Strict return precautions were discussed.  Patient appears safe for discharge. She is ambulatory out of the department.   Patient case discussed with Dr. Laverta Baltimore who is in agreement with plan.  Final Clinical Impressions(s) / ED Diagnoses   Final diagnoses:  Animal bite  Cellulitis of right lower extremity    ED Discharge Orders        Ordered    doxycycline (VIBRAMYCIN) 100 MG capsule  2 times daily     06/26/18 1620       Lorelle Gibbs 06/26/18 2003    Long, Joshua G, MD 06/26/18 [phone]

## 2018-06-26 NOTE — ED Notes (Signed)
Pt. To CT     Antibiotic to be started after she returns.  Pt. Second set of blood cultures have been drawn and taken to lab.

## 2018-06-28 ENCOUNTER — Emergency Department (HOSPITAL_BASED_OUTPATIENT_CLINIC_OR_DEPARTMENT_OTHER)
Admission: EM | Admit: 2018-06-28 | Discharge: 2018-06-28 | Disposition: A | Discharge status: Home / Self Care | Payer: 59 | Attending: Emergency Medicine | Admitting: Emergency Medicine

## 2018-06-28 ENCOUNTER — Other Ambulatory Visit: Payer: Self-pay

## 2018-06-28 ENCOUNTER — Encounter (HOSPITAL_BASED_OUTPATIENT_CLINIC_OR_DEPARTMENT_OTHER): Payer: Self-pay

## 2018-06-28 DIAGNOSIS — M79604 Pain in right leg: Secondary | ICD-10-CM | POA: Diagnosis present

## 2018-06-28 DIAGNOSIS — Z79899 Other long term (current) drug therapy: Secondary | ICD-10-CM | POA: Diagnosis not present

## 2018-06-28 DIAGNOSIS — L02415 Cutaneous abscess of right lower limb: Secondary | ICD-10-CM | POA: Insufficient documentation

## 2018-06-28 MED ORDER — LIDOCAINE-EPINEPHRINE (PF) 2 %-1:200000 IJ SOLN
10.0000 mL | Freq: Once | INTRAMUSCULAR | Status: AC
  Administered 2018-06-28: 10 mL via INTRADERMAL
  Filled 2018-06-28: qty 10

## 2018-06-28 MED ORDER — LIDOCAINE-EPINEPHRINE 2 %-1:100000 IJ SOLN
20.0000 mL | Freq: Once | INTRAMUSCULAR | Status: DC
  Filled 2018-06-28: qty 20

## 2018-06-28 MED ORDER — LIDOCAINE-EPINEPHRINE (PF) 2 %-1:200000 IJ SOLN
INTRAMUSCULAR | Status: AC
  Filled 2018-06-28: qty 10

## 2018-06-28 NOTE — ED Provider Notes (Signed)
[facility] EMERGENCY DEPARTMENT Provider Note   CSN: [id number] Arrival date & time: 06/28/18  1146     History   Chief Complaint Chief Complaint  Patient presents with  . Cellulitis    HPI Mary Freeman is a 67 y.o. female.  The history is provided by the patient. No language interpreter was used.   Mary Freeman is a 67 y.o. female who presents to the Emergency Department complaining of cellulitis. He presents to the emergency department complaining of worsening redness and pain to her right leg. On Sunday she sustained a cat bite at home. The next day she went to urgent care and was started on Augmentin BID. She took about five doses when she noted that the swelling was worsening and she went to the emergency department where she was treated with IV ampicillin her antibiotic was changed to doxycycline. Two days ago she noted that there was drainage and pus coming from the wound and this is now stopped. Today she presents for evaluation because she has ongoing pain and the redness is starting to extend beyond the drawn borders on her leg. She does state that the swelling in her foot has decreased. She has no medical problems. her tetanus shot is up-to-date. The cat is an indoor cat and it's rabies vaccine is up-to-date. Past Medical History:  Diagnosis Date  . Depression   . Headache    migraines  . PONV (postoperative nausea and vomiting)     There are no active problems to display for this patient.   Past Surgical History:  Procedure Laterality Date  . APPENDECTOMY    . BREAST EXCISIONAL BIOPSY Right    benign  . COLONOSCOPY    . KNEE ARTHROSCOPY Right    x3  . KNEE ARTHROSCOPY W/ MENISCAL REPAIR Left   . LUMBAR LAMINECTOMY  1986  . REDUCTION MAMMAPLASTY Bilateral    2006  . ROTATOR CUFF REPAIR Right 2012  . TONSILLECTOMY    . TUBAL LIGATION       OB History   None      Home Medications    Prior to Admission medications   Medication Sig Start  Date End Date Taking? Authorizing Provider  acetaminophen-codeine (TYLENOL #3) 300-30 MG tablet Take 1-2 tablets by mouth every 6 (six) hours as needed for moderate pain. 06/24/18   Scot Jun, FNP  Calcium-Magnesium (CAL-MAG PO) Take 1 tablet by mouth daily.    [provider]  cholecalciferol (VITAMIN D) 1000 units tablet Take 1,000 Units by mouth daily.    [provider]  doxycycline (VIBRAMYCIN) 100 MG capsule Take 1 capsule (100 mg total) by mouth 2 (two) times daily. 06/26/18   Maczis, Barth Kirks, PA-C  FLUoxetine (PROZAC) 20 MG tablet Take 20 mg by mouth daily.    [provider]  LORazepam (ATIVAN) 0.5 MG tablet Take 0.5 mg by mouth at bedtime as needed for sleep.    [provider]  Multiple Vitamins-Minerals (MULTIVITAMIN WITH MINERALS) tablet Take 1 tablet by mouth daily.    [provider]  rizatriptan (MAXALT) 10 MG tablet Take 10 mg by mouth as needed for migraine. May repeat in 2 hours if needed    [provider]  vitamin E 100 UNIT capsule Take 100 Units by mouth daily.    [provider]    Family History Family History  Problem Relation Age of Onset  . Cancer Father  lung  . Cancer Sister        breast  . Alcoholism Mother   . Alcoholism Brother   . Cancer Sister     Social History Social History   Tobacco Use  . Smoking status: Never Smoker  . Smokeless tobacco: Never Used  Substance Use Topics  . Alcohol use: Yes    Comment: very rare   . Drug use: No     Allergies   Amitriptyline; Dilaudid [hydromorphone]; Diphenhydramine; Stadol [butorphanol]; Talwin [pentazocine]; Benadryl [diphenhydramine hcl]; Solu-medrol [methylprednisolone]; Butorphanol tartrate; Hydrocodone-acetaminophen; Hydromorphone hcl; Morphine and related; Other; Oxycodone-acetaminophen; Phenergan [promethazine]; and Sulfa antibiotics   Review of Systems Review of Systems  All other systems reviewed and are  negative.    Physical Exam Updated Vital Signs BP (!) 111/51 (BP Location: Left Arm)   Pulse 74   Temp 97.8 F (36.6 C) (Oral)   Resp 18   Ht 5\' 8"  (1.727 m)   Wt 63.5 kg (140 lb)   SpO2 99%   BMI 21.29 kg/m   Physical Exam  Constitutional: She is oriented to person, place, and time. She appears well-developed and well-nourished. No distress.  HENT:  Head: Normocephalic and atraumatic.  Cardiovascular: Normal rate and regular rhythm.  Pulmonary/Chest: Effort normal. No respiratory distress.  Musculoskeletal: Normal range of motion.  2+ DP pulses bilaterally.  RLE with moderate erythema and edema, approx 4x5 cm with central scab.  Flexion/extension intact at foot.    Neurological: She is alert and oriented to person, place, and time.  Skin: Skin is warm and dry. Capillary refill takes less than 2 seconds.  Psychiatric: She has a normal mood and affect. Her behavior is normal.  Nursing note and vitals reviewed.    ED Treatments / Results  Labs (all labs ordered are listed, but only abnormal results are displayed) Labs Reviewed - No data to display  EKG None  Radiology Ct Extremity Lower Right W Contrast  Result Date: 06/26/2018 CLINICAL DATA:  Patient status post cat bite of the right lower leg 1 week ago with onset of redness and swelling which is worst about the lateral aspect of the ankle. Initial encounter. EXAM: CT OF THE LOWER RIGHT EXTREMITY WITH CONTRAST TECHNIQUE: Multidetector CT imaging of the lower right extremity was performed according to the standard protocol following intravenous contrast administration. COMPARISON:  None. CONTRAST:  100 mL ISOVUE-300 IOPAMIDOL (ISOVUE-300) INJECTION 61% FINDINGS: Bones/Joint/Cartilage No acute bony or joint abnormality is seen. No periosteal reaction or bony destructive change. Small benign enchondroma or hemangioma in the distal femur is incidentally noted. Ligaments Suboptimally assessed by CT. Muscles and Tendons Intact.  No gas within muscle or tracking along fascial planes is identified. Soft tissues Subcutaneous edema about the ankle is more notable on the lateral side. No abscess is identified. There is no joint effusion. IMPRESSION: Subcutaneous edema most notable about the ankle on the lateral side is compatible with cellulitis given the patient's history. Negative for osteomyelitis, fasciitis, myositis or septic joint. Electronically Signed   By: Inge Rise M.D.   On: 06/26/2018 15:28    Procedures .Marland KitchenIncision and Drainage Date/Time: 06/28/2018 1:43 PM Performed by: Quintella Reichert, MD Authorized by: Quintella Reichert, MD   Consent:    Consent obtained:  Verbal   Consent given by:  Patient   Risks discussed:  Bleeding, incomplete drainage and pain Location:    Type:  Abscess   Location:  Lower extremity   Lower extremity location:  Leg   Leg location:  R lower leg Pre-procedure details:    Skin preparation:  Betadine Anesthesia (see MAR for exact dosages):    Anesthesia method:  Local infiltration   Local anesthetic:  Lidocaine 2% WITH epi Procedure type:    Complexity:  Complex Procedure details:    Incision types:  Stab incision   Scalpel blade:  11   Wound management:  Probed and deloculated and irrigated with saline   Drainage:  Purulent and bloody   Drainage amount:  Moderate   Wound treatment:  Wound left open   Packing materials:  None Post-procedure details:    Patient tolerance of procedure:  Tolerated well, no immediate complications   (including critical care time)  Medications Ordered in ED Medications  lidocaine-EPINEPHrine (XYLOCAINE W/EPI) 2 %-1:100000 (with pres) injection 20 mL (has no administration in time range)     Initial Impression / Assessment and Plan / ED Course  I have reviewed the triage vital signs and the nursing notes.  Pertinent labs & imaging results that were available during my care of the patient were reviewed by me and considered in my medical  decision making (see chart for details).     Here for evaluation of swelling and pain to the right lower extremity, currently on doxycycline for a cat bite. She is non-toxic appearing on examination and neurovascularly intact. She does have an abscess on examination that was incised and drained per procedure note. Will change her back to Augmentin for better coverage following cat bite. Counseled patient on wound care, outpatient follow-up and return precautions.  Final Clinical Impressions(s) / ED Diagnoses   Final diagnoses:  None    ED Discharge Orders    None       Quintella Reichert, MD 06/28/18 1539

## 2018-06-28 NOTE — Discharge Instructions (Addendum)
You can stop taking the Doxycycline and start taking the Augmentin again.

## 2018-06-28 NOTE — ED Triage Notes (Addendum)
Pt states right LE cellulitis is worse-states she has had po abx x 2 and IV abx here-feels she needs additional IV abx-also states she has a RTW note for tomorrow and feels she is not able to work 12 hour shift-NAD-slow steady gait

## 2018-07-01 LAB — CULTURE, BLOOD (ROUTINE X 2)
CULTURE: NO GROWTH
CULTURE: NO GROWTH
SPECIAL REQUESTS: ADEQUATE
SPECIAL REQUESTS: ADEQUATE

## 2018-07-12 ENCOUNTER — Ambulatory Visit
Admission: RE | Admit: 2018-07-12 | Discharge: 2018-07-12 | Disposition: A | Discharge status: Home / Self Care | Payer: 59 | Source: Ambulatory Visit | Attending: Family Medicine | Admitting: Family Medicine

## 2018-07-12 DIAGNOSIS — Z780 Asymptomatic menopausal state: Secondary | ICD-10-CM | POA: Diagnosis not present

## 2018-07-12 DIAGNOSIS — M8580 Other specified disorders of bone density and structure, unspecified site: Secondary | ICD-10-CM

## 2018-07-12 DIAGNOSIS — Z1231 Encounter for screening mammogram for malignant neoplasm of breast: Secondary | ICD-10-CM

## 2018-07-12 DIAGNOSIS — M85852 Other specified disorders of bone density and structure, left thigh: Secondary | ICD-10-CM | POA: Diagnosis not present

## 2018-07-26 DIAGNOSIS — M9903 Segmental and somatic dysfunction of lumbar region: Secondary | ICD-10-CM | POA: Diagnosis not present

## 2018-07-26 DIAGNOSIS — M9902 Segmental and somatic dysfunction of thoracic region: Secondary | ICD-10-CM | POA: Diagnosis not present

## 2018-07-26 DIAGNOSIS — M531 Cervicobrachial syndrome: Secondary | ICD-10-CM | POA: Diagnosis not present

## 2018-07-26 DIAGNOSIS — M9901 Segmental and somatic dysfunction of cervical region: Secondary | ICD-10-CM | POA: Diagnosis not present

## 2018-07-26 DIAGNOSIS — M6080 Other myositis, unspecified site: Secondary | ICD-10-CM | POA: Diagnosis not present

## 2018-07-26 DIAGNOSIS — M9904 Segmental and somatic dysfunction of sacral region: Secondary | ICD-10-CM | POA: Diagnosis not present

## 2018-08-15 DIAGNOSIS — M9903 Segmental and somatic dysfunction of lumbar region: Secondary | ICD-10-CM | POA: Diagnosis not present

## 2018-08-15 DIAGNOSIS — M9904 Segmental and somatic dysfunction of sacral region: Secondary | ICD-10-CM | POA: Diagnosis not present

## 2018-08-15 DIAGNOSIS — M6080 Other myositis, unspecified site: Secondary | ICD-10-CM | POA: Diagnosis not present

## 2018-08-15 DIAGNOSIS — M531 Cervicobrachial syndrome: Secondary | ICD-10-CM | POA: Diagnosis not present

## 2018-08-15 DIAGNOSIS — M9901 Segmental and somatic dysfunction of cervical region: Secondary | ICD-10-CM | POA: Diagnosis not present

## 2018-08-15 DIAGNOSIS — M9902 Segmental and somatic dysfunction of thoracic region: Secondary | ICD-10-CM | POA: Diagnosis not present

## 2018-08-15 MED FILL — FLUoxetine HCL 40 MG CAPS: 40 | 90 days supply | Qty: 90 | Fill #1

## 2018-08-15 MED FILL — SHIPPING COST: 1 days supply | Qty: 1 | Fill #4

## 2018-08-15 MED FILL — METAXALONE 800 MG TABLET: 800 | 30 days supply | Qty: 90 | Fill #0

## 2018-08-15 MED FILL — NAPROXEN 500 MG TABLET: 500 | 30 days supply | Qty: 60 | Fill #0

## 2018-08-20 DIAGNOSIS — H40013 Open angle with borderline findings, low risk, bilateral: Secondary | ICD-10-CM | POA: Diagnosis not present

## 2018-09-03 DIAGNOSIS — Z23 Encounter for immunization: Secondary | ICD-10-CM | POA: Diagnosis not present

## 2018-09-03 DIAGNOSIS — M8580 Other specified disorders of bone density and structure, unspecified site: Secondary | ICD-10-CM | POA: Diagnosis not present

## 2018-09-03 DIAGNOSIS — M542 Cervicalgia: Secondary | ICD-10-CM | POA: Diagnosis not present

## 2018-09-03 DIAGNOSIS — B009 Herpesviral infection, unspecified: Secondary | ICD-10-CM | POA: Diagnosis not present

## 2018-09-03 DIAGNOSIS — G43009 Migraine without aura, not intractable, without status migrainosus: Secondary | ICD-10-CM | POA: Diagnosis not present

## 2018-09-03 DIAGNOSIS — Z6821 Body mass index (BMI) 21.0-21.9, adult: Secondary | ICD-10-CM | POA: Diagnosis not present

## 2018-09-03 DIAGNOSIS — F329 Major depressive disorder, single episode, unspecified: Secondary | ICD-10-CM | POA: Diagnosis not present

## 2018-09-07 DIAGNOSIS — M531 Cervicobrachial syndrome: Secondary | ICD-10-CM | POA: Diagnosis not present

## 2018-09-07 DIAGNOSIS — M9904 Segmental and somatic dysfunction of sacral region: Secondary | ICD-10-CM | POA: Diagnosis not present

## 2018-09-07 DIAGNOSIS — M6080 Other myositis, unspecified site: Secondary | ICD-10-CM | POA: Diagnosis not present

## 2018-09-07 DIAGNOSIS — M9902 Segmental and somatic dysfunction of thoracic region: Secondary | ICD-10-CM | POA: Diagnosis not present

## 2018-09-07 DIAGNOSIS — M9903 Segmental and somatic dysfunction of lumbar region: Secondary | ICD-10-CM | POA: Diagnosis not present

## 2018-09-07 DIAGNOSIS — M9901 Segmental and somatic dysfunction of cervical region: Secondary | ICD-10-CM | POA: Diagnosis not present

## 2018-09-11 IMAGING — CT CT EXTREM LOW W/ CM*R*
3 of 4 series · 7 of 33 positions shown, 9 images · IV contrast (agent unspecified)
Comparison: None.

CONTRAST:  100 mL [id number] IOPAMIDOL ([id number]) INJECTION 61%

CLINICAL DATA: Patient status post cat bite of the right lower leg
1 week ago with onset of redness and swelling which is worst about
the lateral aspect of the ankle. Initial encounter.

EXAM:
CT OF THE LOWER RIGHT EXTREMITY WITH CONTRAST
TECHNIQUE: Multidetector CT imaging of the lower right extremity was performed
according to the standard protocol following intravenous contrast
administration.

[Series 9: axial st · coronal · 0.46mm/px · 1 of 229 slices shown (1 of 2)]
[im 115/229  bone]
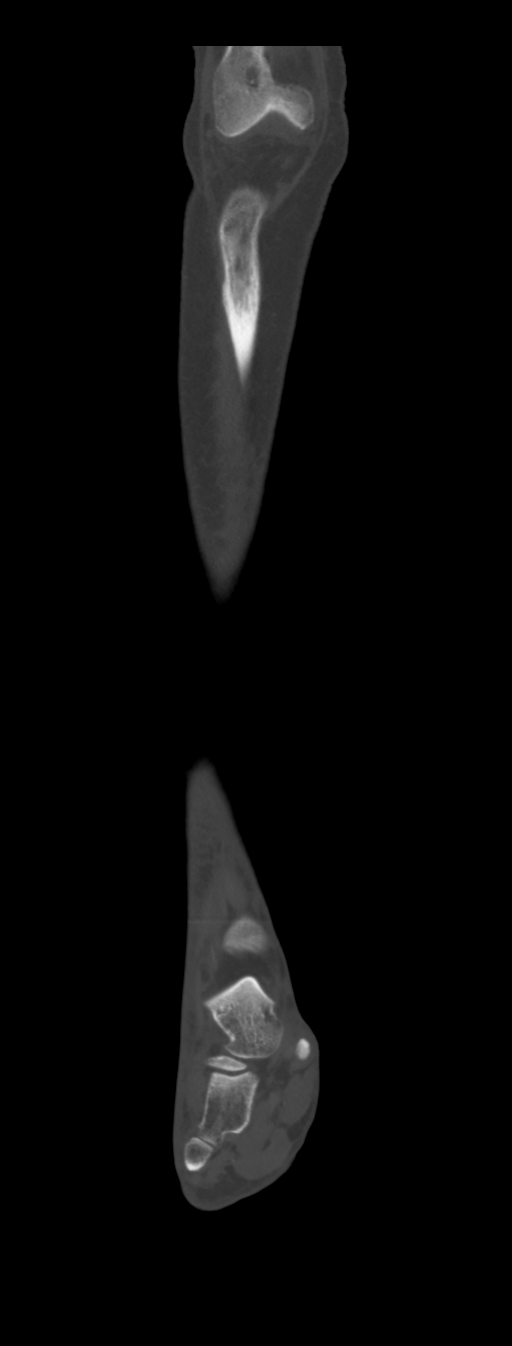

[Series 11: sag st · sagittal · 0.46mm/px · 5 of 236 slices shown, 6 images]
[im 79/236  bone]
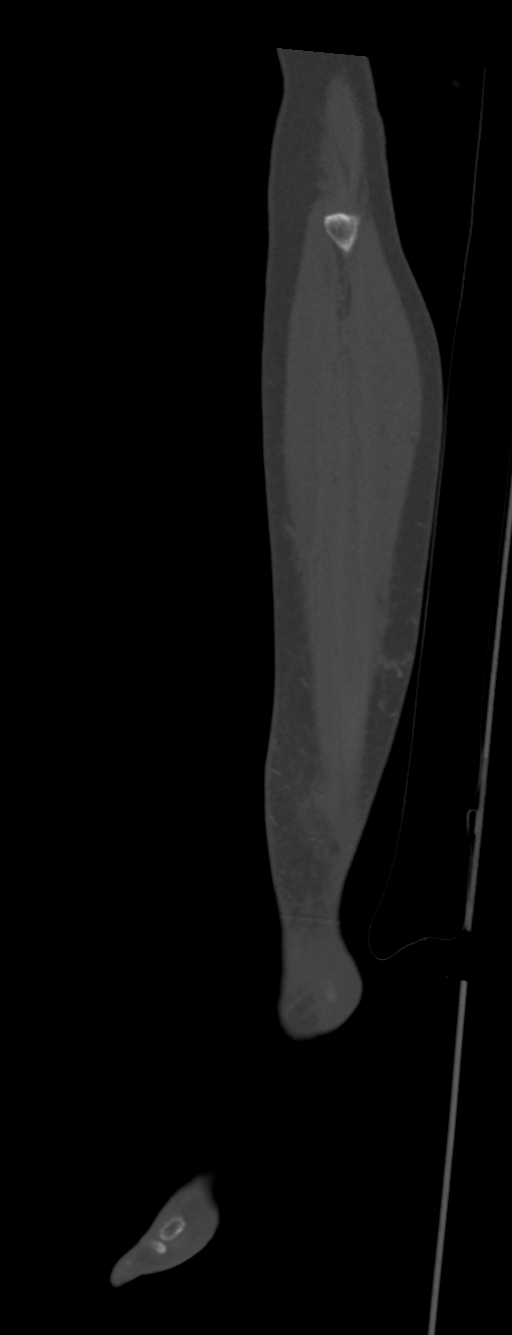
[im 98/236  bone]
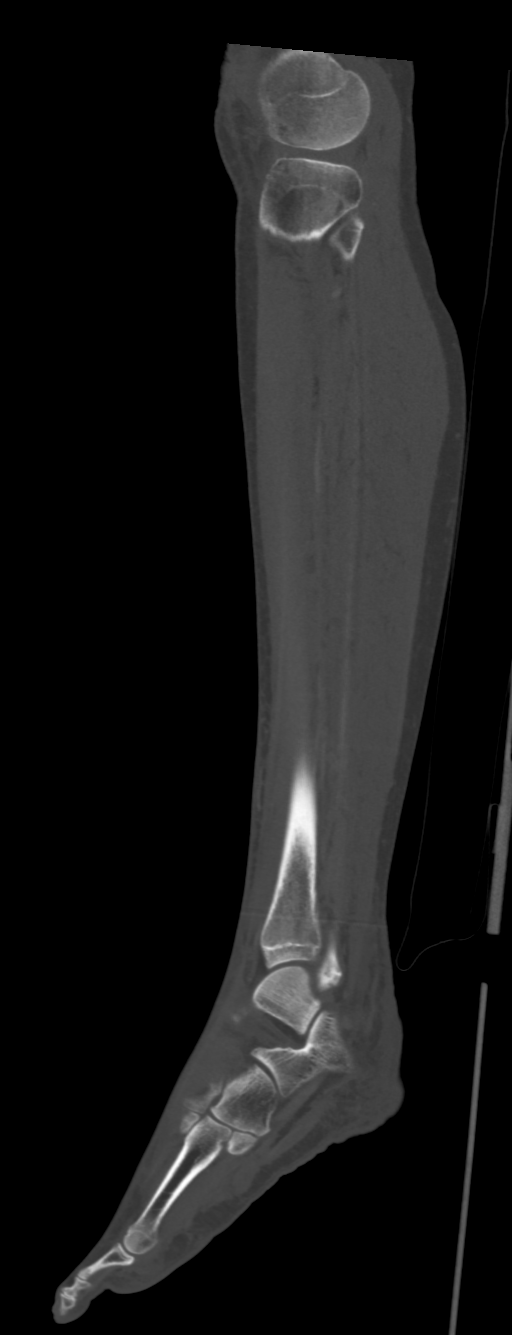
[im 118/236  soft-tissue]
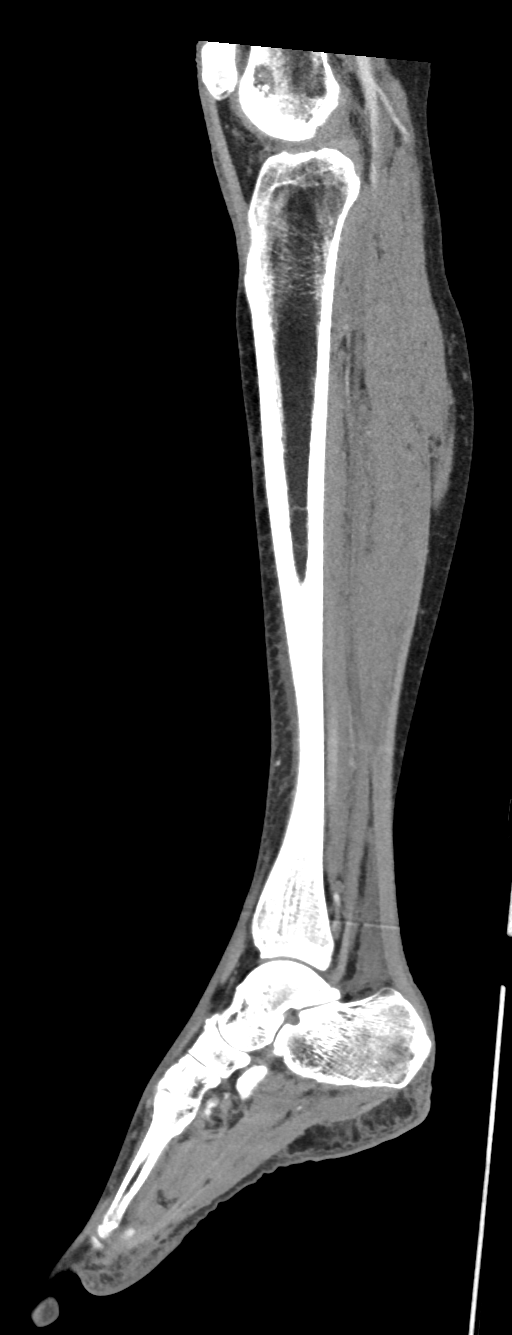
[im 118/236  bone]
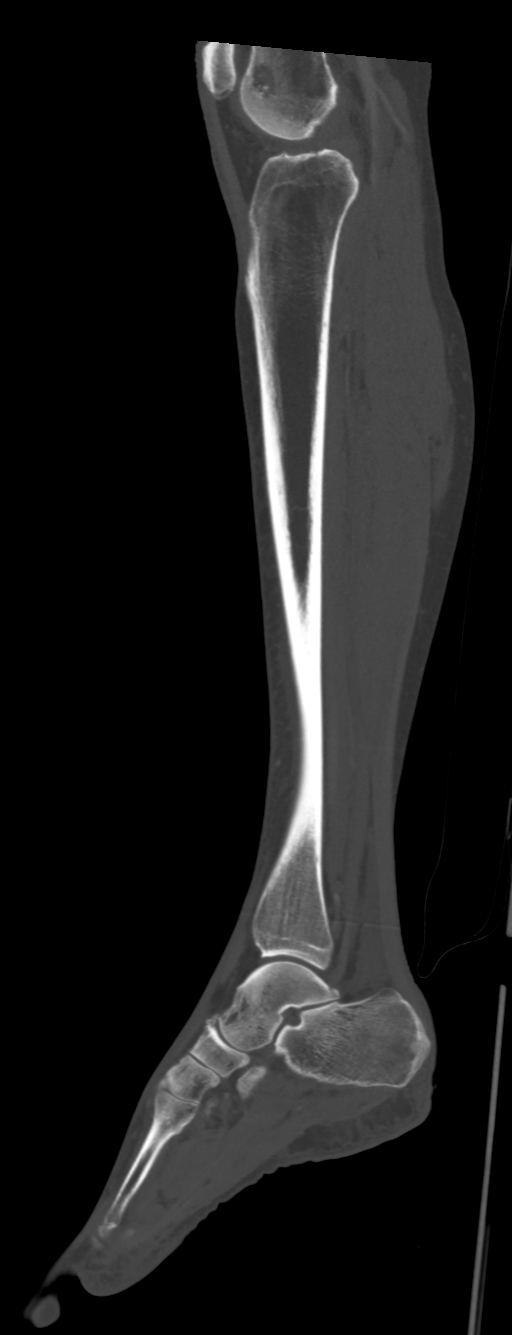
[im 138/236  bone]
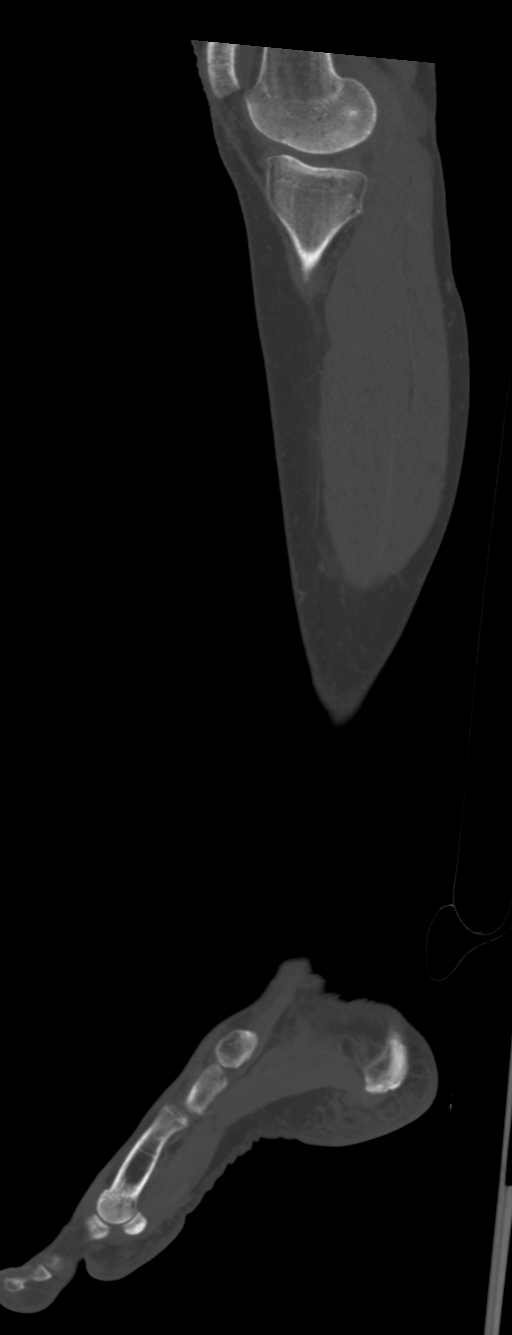
[im 157/236  bone]
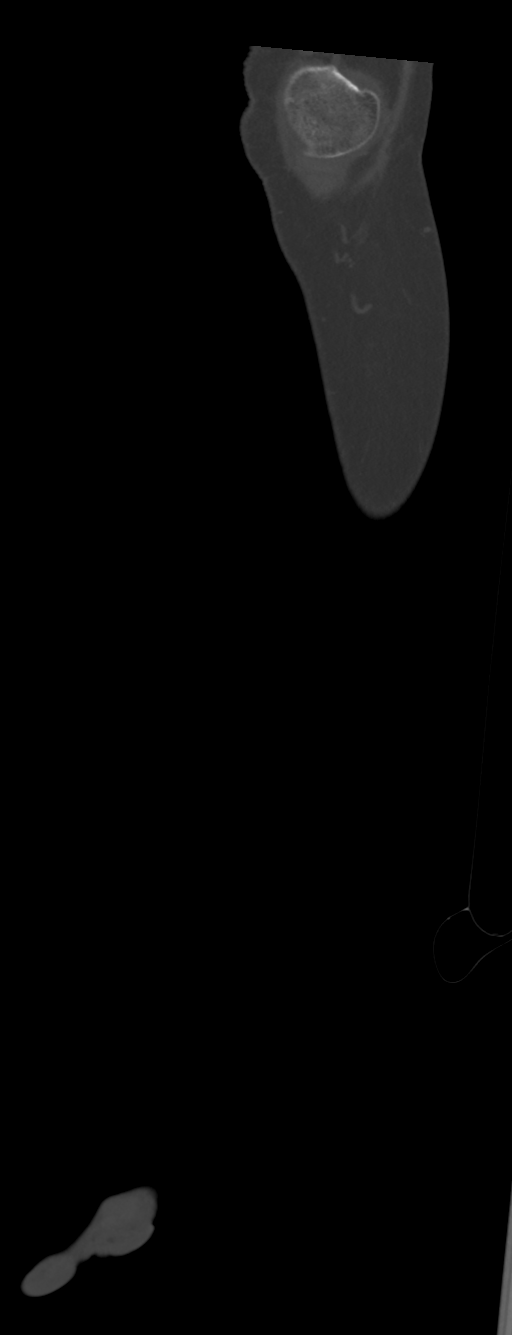

[Series 12: axial st · axial · 0.46mm/px · z∈[-66,-66]mm · 1 of 609 slices shown, 2 images (2 of 2)]
[im 328/609  soft-tissue]
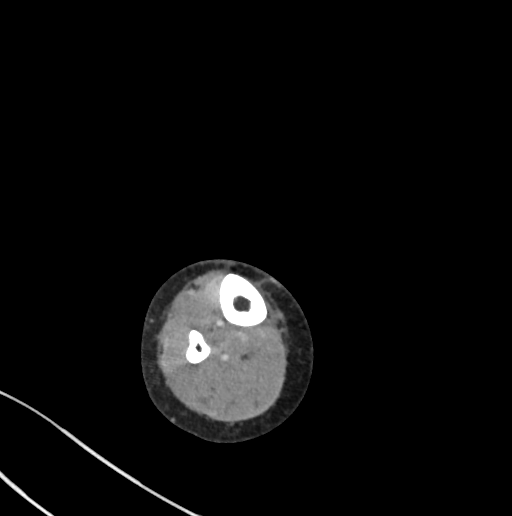
[im 328/609  bone]
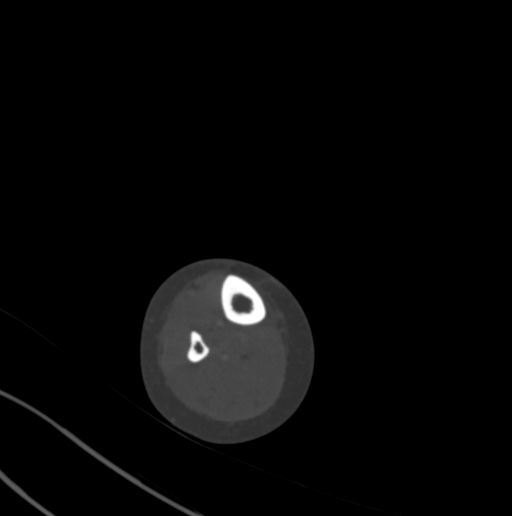

[7 of 33 positions shown; findings below may reference images not displayed]

FINDINGS: Bones/Joint/Cartilage

No acute bony or joint abnormality is seen. No periosteal reaction
or bony destructive change. Small benign enchondroma or hemangioma
in the distal femur is incidentally noted.

Ligaments

Suboptimally assessed by CT.

Muscles and Tendons

Intact. No gas within muscle or tracking along fascial planes is
identified.

Soft tissues

Subcutaneous edema about the ankle is more notable on the lateral
side. No abscess is identified. There is no joint effusion.
IMPRESSION: Subcutaneous edema most notable about the ankle on the lateral side
is compatible with cellulitis given the patient's history. Negative
for osteomyelitis, fasciitis, myositis or septic joint.

## 2018-09-27 DIAGNOSIS — M531 Cervicobrachial syndrome: Secondary | ICD-10-CM | POA: Diagnosis not present

## 2018-09-27 DIAGNOSIS — M9902 Segmental and somatic dysfunction of thoracic region: Secondary | ICD-10-CM | POA: Diagnosis not present

## 2018-09-27 DIAGNOSIS — M9903 Segmental and somatic dysfunction of lumbar region: Secondary | ICD-10-CM | POA: Diagnosis not present

## 2018-09-27 DIAGNOSIS — M9901 Segmental and somatic dysfunction of cervical region: Secondary | ICD-10-CM | POA: Diagnosis not present

## 2018-09-27 DIAGNOSIS — M6080 Other myositis, unspecified site: Secondary | ICD-10-CM | POA: Diagnosis not present

## 2018-09-27 DIAGNOSIS — M9904 Segmental and somatic dysfunction of sacral region: Secondary | ICD-10-CM | POA: Diagnosis not present

## 2018-10-15 DIAGNOSIS — M9904 Segmental and somatic dysfunction of sacral region: Secondary | ICD-10-CM | POA: Diagnosis not present

## 2018-10-15 DIAGNOSIS — M9902 Segmental and somatic dysfunction of thoracic region: Secondary | ICD-10-CM | POA: Diagnosis not present

## 2018-10-15 DIAGNOSIS — M9903 Segmental and somatic dysfunction of lumbar region: Secondary | ICD-10-CM | POA: Diagnosis not present

## 2018-10-15 DIAGNOSIS — M531 Cervicobrachial syndrome: Secondary | ICD-10-CM | POA: Diagnosis not present

## 2018-10-15 DIAGNOSIS — M9901 Segmental and somatic dysfunction of cervical region: Secondary | ICD-10-CM | POA: Diagnosis not present

## 2018-10-15 DIAGNOSIS — M6080 Other myositis, unspecified site: Secondary | ICD-10-CM | POA: Diagnosis not present

## 2018-10-29 MED FILL — SHIPPING COST: 1 days supply | Qty: 1 | Fill #5

## 2018-10-29 MED FILL — METAXALONE 800 MG TABLET: 800 | 30 days supply | Qty: 90 | Fill #0

## 2018-11-09 DIAGNOSIS — M9903 Segmental and somatic dysfunction of lumbar region: Secondary | ICD-10-CM | POA: Diagnosis not present

## 2018-11-09 DIAGNOSIS — M9901 Segmental and somatic dysfunction of cervical region: Secondary | ICD-10-CM | POA: Diagnosis not present

## 2018-11-09 DIAGNOSIS — M9904 Segmental and somatic dysfunction of sacral region: Secondary | ICD-10-CM | POA: Diagnosis not present

## 2018-11-09 DIAGNOSIS — M9902 Segmental and somatic dysfunction of thoracic region: Secondary | ICD-10-CM | POA: Diagnosis not present

## 2018-11-09 DIAGNOSIS — M6080 Other myositis, unspecified site: Secondary | ICD-10-CM | POA: Diagnosis not present

## 2018-11-09 DIAGNOSIS — M531 Cervicobrachial syndrome: Secondary | ICD-10-CM | POA: Diagnosis not present

## 2018-11-14 MED FILL — FLUoxetine HCL 20 MG CAPS: 20 | 30 days supply | Qty: 30 | Fill #0

## 2018-11-14 MED FILL — SHIPPING COST: 1 days supply | Qty: 1 | Fill #6

## 2018-11-27 DIAGNOSIS — M531 Cervicobrachial syndrome: Secondary | ICD-10-CM | POA: Diagnosis not present

## 2018-11-27 DIAGNOSIS — M6080 Other myositis, unspecified site: Secondary | ICD-10-CM | POA: Diagnosis not present

## 2018-11-27 DIAGNOSIS — M9902 Segmental and somatic dysfunction of thoracic region: Secondary | ICD-10-CM | POA: Diagnosis not present

## 2018-11-27 DIAGNOSIS — M9903 Segmental and somatic dysfunction of lumbar region: Secondary | ICD-10-CM | POA: Diagnosis not present

## 2018-11-27 DIAGNOSIS — M9904 Segmental and somatic dysfunction of sacral region: Secondary | ICD-10-CM | POA: Diagnosis not present

## 2018-11-27 DIAGNOSIS — M9901 Segmental and somatic dysfunction of cervical region: Secondary | ICD-10-CM | POA: Diagnosis not present

## 2018-12-03 DIAGNOSIS — L820 Inflamed seborrheic keratosis: Secondary | ICD-10-CM | POA: Diagnosis not present

## 2018-12-03 DIAGNOSIS — D225 Melanocytic nevi of trunk: Secondary | ICD-10-CM | POA: Diagnosis not present

## 2018-12-03 DIAGNOSIS — L570 Actinic keratosis: Secondary | ICD-10-CM | POA: Diagnosis not present

## 2018-12-03 DIAGNOSIS — L821 Other seborrheic keratosis: Secondary | ICD-10-CM | POA: Diagnosis not present

## 2018-12-03 DIAGNOSIS — Z85828 Personal history of other malignant neoplasm of skin: Secondary | ICD-10-CM | POA: Diagnosis not present

## 2018-12-26 DIAGNOSIS — M6080 Other myositis, unspecified site: Secondary | ICD-10-CM | POA: Diagnosis not present

## 2018-12-26 DIAGNOSIS — M9903 Segmental and somatic dysfunction of lumbar region: Secondary | ICD-10-CM | POA: Diagnosis not present

## 2018-12-26 DIAGNOSIS — M9902 Segmental and somatic dysfunction of thoracic region: Secondary | ICD-10-CM | POA: Diagnosis not present

## 2018-12-26 DIAGNOSIS — M531 Cervicobrachial syndrome: Secondary | ICD-10-CM | POA: Diagnosis not present

## 2018-12-26 DIAGNOSIS — M9901 Segmental and somatic dysfunction of cervical region: Secondary | ICD-10-CM | POA: Diagnosis not present

## 2018-12-26 DIAGNOSIS — M9904 Segmental and somatic dysfunction of sacral region: Secondary | ICD-10-CM | POA: Diagnosis not present

## 2019-01-04 ENCOUNTER — Encounter: Payer: Self-pay | Admitting: *Deleted

## 2019-01-04 ENCOUNTER — Other Ambulatory Visit: Payer: Self-pay

## 2019-01-04 ENCOUNTER — Emergency Department (INDEPENDENT_AMBULATORY_CARE_PROVIDER_SITE_OTHER)
Admission: EM | Admit: 2019-01-04 | Discharge: 2019-01-04 | Disposition: A | Discharge status: Home / Self Care | Payer: 59 | Source: Home / Self Care

## 2019-01-04 DIAGNOSIS — R6889 Other general symptoms and signs: Secondary | ICD-10-CM

## 2019-01-04 DIAGNOSIS — J069 Acute upper respiratory infection, unspecified: Secondary | ICD-10-CM

## 2019-01-04 MED ORDER — AMOXICILLIN-POT CLAVULANATE 875-125 MG PO TABS: 1.0000 | ORAL_TABLET | Freq: Two times a day (BID) | ORAL | 0 refills | Status: AC

## 2019-01-04 NOTE — ED Triage Notes (Signed)
Pt c/o body aches, fatigue, decreased appetite, nasal congestion, and productive cough x 1 wk. Denies fever.

## 2019-01-04 NOTE — ED Provider Notes (Signed)
[name]    CSN: [id number] Arrival date & time: 01/04/19  1358     History   Chief Complaint Chief Complaint  Patient presents with  . Generalized Body Aches  . Cough    HPI Mary Freeman is a 68 y.o. female.   HPI  Mary Freeman is a 68 y.o. female presenting to UC with c/o body aches, fatigue, decreased appetite, green-yellow thick nasal discharge and sputum with cough for about 1 week.  Pt works in [employer]. Pt notes her boss was dx with the flu last week. Pt did receive the flu vaccine this year.  She has taken OTC Robitussin DM with mild relief. Denies known fever. Denies n/v/d.    Past Medical History:  Diagnosis Date  . Depression   . Headache    migraines  . PONV (postoperative nausea and vomiting)     There are no active problems to display for this patient.   Past Surgical History:  Procedure Laterality Date  . APPENDECTOMY    . BREAST EXCISIONAL BIOPSY Right    benign  . COLONOSCOPY    . KNEE ARTHROSCOPY Right    x3  . KNEE ARTHROSCOPY W/ MENISCAL REPAIR Left   . LUMBAR LAMINECTOMY  1986  . REDUCTION MAMMAPLASTY Bilateral    2006  . ROTATOR CUFF REPAIR Right 2012  . TONSILLECTOMY    . TUBAL LIGATION      OB History   No obstetric history on file.      Home Medications    Prior to Admission medications   Medication Sig Start Date End Date Taking? Authorizing Provider  acetaminophen-codeine (TYLENOL #3) 300-30 MG tablet Take 1-2 tablets by mouth every 6 (six) hours as needed for moderate pain. 06/24/18   Scot Jun, FNP  amoxicillin-clavulanate (AUGMENTIN) 875-125 MG tablet Take 1 tablet by mouth 2 (two) times daily for 10 days. 01/04/19 01/14/19  Noe Gens, PA-C  Calcium-Magnesium (CAL-MAG PO) Take 1 tablet by mouth daily.    [provider]  cholecalciferol (VITAMIN D) 1000 units tablet Take 1,000 Units by mouth daily.    [provider]  FLUoxetine (PROZAC) 20 MG tablet Take 20 mg by mouth  daily.    [provider]  LORazepam (ATIVAN) 0.5 MG tablet Take 0.5 mg by mouth at bedtime as needed for sleep.    [provider]  Multiple Vitamins-Minerals (MULTIVITAMIN WITH MINERALS) tablet Take 1 tablet by mouth daily.    [provider]  rizatriptan (MAXALT) 10 MG tablet Take 10 mg by mouth as needed for migraine. May repeat in 2 hours if needed    [provider]  vitamin E 100 UNIT capsule Take 100 Units by mouth daily.    [provider]    Family History Family History  Problem Relation Age of Onset  . Cancer Father        lung  . Cancer Sister        breast  . Breast cancer Sister 9  . Alcoholism Mother   . Alcoholism Brother   . Cancer Sister   . Breast cancer Sister 74    Social History Social History   Tobacco Use  . Smoking status: Never Smoker  . Smokeless tobacco: Never Used  Substance Use Topics  . Alcohol use: Yes    Comment: very rare   . Drug use: No     Allergies   Amitriptyline; Dilaudid [hydromorphone]; Diphenhydramine; Stadol [butorphanol]; Talwin [pentazocine]; Benadryl [  diphenhydramine hcl]; Solu-medrol [methylprednisolone]; Butorphanol tartrate; Hydrocodone-acetaminophen; Hydromorphone hcl; Morphine and related; Other; Oxycodone-acetaminophen; Phenergan [promethazine]; and Sulfa antibiotics   Review of Systems Review of Systems  Constitutional: Positive for appetite change and fatigue. Negative for chills and fever.  HENT: Positive for congestion. Negative for ear pain, sore throat, trouble swallowing and voice change.   Respiratory: Positive for cough. Negative for shortness of breath.   Cardiovascular: Negative for chest pain and palpitations.  Gastrointestinal: Negative for abdominal pain, diarrhea, nausea and vomiting.  Musculoskeletal: Positive for arthralgias, back pain and myalgias.  Skin: Negative for rash.  Neurological: Positive for headaches. Negative for dizziness and  light-headedness.     Physical Exam Triage Vital Signs ED Triage Vitals  Enc Vitals Group     BP 01/04/19 1424 93/62     Pulse Rate 01/04/19 1424 87     Resp 01/04/19 1424 18     Temp 01/04/19 1424 (!) 96.5 F (35.8 C)     Temp Source 01/04/19 1424 Tympanic     SpO2 01/04/19 1424 98 %     Weight 01/04/19 1425 138 lb (62.6 kg)     Height 01/04/19 1425 5' 7.5" (1.715 m)     Head Circumference --      Peak Flow --      Pain Score 01/04/19 1424 0     Pain Loc --      Pain Edu? --      Excl. in [place]? --    No data found.  Updated Vital Signs BP 93/62 (BP Location: Right Arm)   Pulse 87   Temp (!) 96.5 F (35.8 C) (Tympanic)   Resp 18   Ht 5' 7.5" (1.715 m)   Wt 138 lb (62.6 kg)   SpO2 98%   BMI 21.29 kg/m   Visual Acuity Right Eye Distance:   Left Eye Distance:   Bilateral Distance:    Right Eye Near:   Left Eye Near:    Bilateral Near:     Physical Exam Vitals signs and nursing note reviewed.  Constitutional:      Appearance: Normal appearance. She is well-developed.  HENT:     Head: Normocephalic and atraumatic.     Right Ear: Tympanic membrane normal.     Left Ear: Tympanic membrane normal.     Nose: Congestion present.     Right Sinus: No maxillary sinus tenderness or frontal sinus tenderness.     Left Sinus: No maxillary sinus tenderness or frontal sinus tenderness.     Mouth/Throat:     Lips: Pink.     Mouth: Mucous membranes are moist.     Pharynx: Oropharynx is clear. Uvula midline.  Neck:     Musculoskeletal: Normal range of motion.  Cardiovascular:     Rate and Rhythm: Normal rate and regular rhythm.  Pulmonary:     Effort: Pulmonary effort is normal. No respiratory distress.     Breath sounds: Normal breath sounds. No stridor. No wheezing or rhonchi.  Musculoskeletal: Normal range of motion.  Skin:    General: Skin is warm and dry.  Neurological:     Mental Status: She is alert and oriented to person, place, and time.  Psychiatric:         Behavior: Behavior normal.      UC Treatments / Results  Labs (all labs ordered are listed, but only abnormal results are displayed) Labs Reviewed - No data to display  EKG None  Radiology No results found.  Procedures  Procedures (including critical care time)  Medications Ordered in UC Medications - No data to display  Initial Impression / Assessment and Plan / UC Course  I have reviewed the triage vital signs and the nursing notes.  Pertinent labs & imaging results that were available during my care of the patient were reviewed by me and considered in my medical decision making (see chart for details).    Hx and exam c/w URI and likely flu, however pt is beyond recommended 48 hour tx window and pt reports vomiting with tamiflu in the past. Will cover for secondary bacterial infection given worsening symptoms over the last week. Encouraged symptomatic tx  F/u with PCP as needed.  Final Clinical Impressions(s) / UC Diagnoses   Final diagnoses:  Upper respiratory tract infection, unspecified type  Flu-like symptoms     Discharge Instructions      You may take 500mg  acetaminophen every 4-6 hours or in combination with ibuprofen 400-600mg  every 6-8 hours as needed for pain, inflammation, and fever.  Be sure to well hydrated with clear liquids and get at least 8 hours of sleep at night, preferably more while sick.   Please follow up with family medicine in 1 week if needed.     ED Prescriptions    Medication Sig Dispense Auth. Provider   amoxicillin-clavulanate (AUGMENTIN) 875-125 MG tablet Take 1 tablet by mouth 2 (two) times daily for 10 days. 20 tablet Noe Gens, PA-C     Controlled Substance Prescriptions  Controlled Substance Registry consulted? Not Applicable   Tyrell Antonio 01/04/19 1448

## 2019-01-04 NOTE — Discharge Instructions (Signed)
  You may take 500mg acetaminophen every 4-6 hours or in combination with ibuprofen 400-600mg every 6-8 hours as needed for pain, inflammation, and fever.  Be sure to well hydrated with clear liquids and get at least 8 hours of sleep at night, preferably more while sick.   Please follow up with family medicine in 1 week if needed.   

## 2019-01-07 ENCOUNTER — Telehealth: Payer: Self-pay | Admitting: Emergency Medicine

## 2019-01-07 NOTE — Telephone Encounter (Signed)
Message left on voice mail inquiring about patient's status and encouraging patient to call with questions/concerns.  

## 2019-01-16 DIAGNOSIS — M9904 Segmental and somatic dysfunction of sacral region: Secondary | ICD-10-CM | POA: Diagnosis not present

## 2019-01-16 DIAGNOSIS — M531 Cervicobrachial syndrome: Secondary | ICD-10-CM | POA: Diagnosis not present

## 2019-01-16 DIAGNOSIS — M6080 Other myositis, unspecified site: Secondary | ICD-10-CM | POA: Diagnosis not present

## 2019-01-16 DIAGNOSIS — M9902 Segmental and somatic dysfunction of thoracic region: Secondary | ICD-10-CM | POA: Diagnosis not present

## 2019-01-16 DIAGNOSIS — M9903 Segmental and somatic dysfunction of lumbar region: Secondary | ICD-10-CM | POA: Diagnosis not present

## 2019-01-16 DIAGNOSIS — M9901 Segmental and somatic dysfunction of cervical region: Secondary | ICD-10-CM | POA: Diagnosis not present

## 2019-01-29 DIAGNOSIS — M9903 Segmental and somatic dysfunction of lumbar region: Secondary | ICD-10-CM | POA: Diagnosis not present

## 2019-01-29 DIAGNOSIS — M9902 Segmental and somatic dysfunction of thoracic region: Secondary | ICD-10-CM | POA: Diagnosis not present

## 2019-01-29 DIAGNOSIS — M6080 Other myositis, unspecified site: Secondary | ICD-10-CM | POA: Diagnosis not present

## 2019-01-29 DIAGNOSIS — M9901 Segmental and somatic dysfunction of cervical region: Secondary | ICD-10-CM | POA: Diagnosis not present

## 2019-01-29 DIAGNOSIS — M531 Cervicobrachial syndrome: Secondary | ICD-10-CM | POA: Diagnosis not present

## 2019-01-29 DIAGNOSIS — M9904 Segmental and somatic dysfunction of sacral region: Secondary | ICD-10-CM | POA: Diagnosis not present

## 2019-03-01 DIAGNOSIS — F4321 Adjustment disorder with depressed mood: Secondary | ICD-10-CM | POA: Diagnosis not present

## 2019-03-01 DIAGNOSIS — F329 Major depressive disorder, single episode, unspecified: Secondary | ICD-10-CM | POA: Diagnosis not present

## 2019-03-01 DIAGNOSIS — G479 Sleep disorder, unspecified: Secondary | ICD-10-CM | POA: Diagnosis not present

## 2019-03-01 MED FILL — LORazepam 1 MG TABS: 1 | 90 days supply | Qty: 90 | Fill #0

## 2019-03-01 MED FILL — FLUoxetine HCL 20 MG CAPS: 20 | 90 days supply | Qty: 90 | Fill #0

## 2019-03-04 DIAGNOSIS — M859 Disorder of bone density and structure, unspecified: Secondary | ICD-10-CM | POA: Diagnosis not present

## 2019-03-04 DIAGNOSIS — G43009 Migraine without aura, not intractable, without status migrainosus: Secondary | ICD-10-CM | POA: Diagnosis not present

## 2019-03-04 DIAGNOSIS — F4321 Adjustment disorder with depressed mood: Secondary | ICD-10-CM | POA: Diagnosis not present

## 2019-03-04 DIAGNOSIS — M542 Cervicalgia: Secondary | ICD-10-CM | POA: Diagnosis not present

## 2019-03-05 ENCOUNTER — Other Ambulatory Visit: Payer: Self-pay

## 2019-03-05 ENCOUNTER — Emergency Department (INDEPENDENT_AMBULATORY_CARE_PROVIDER_SITE_OTHER)
Admission: EM | Admit: 2019-03-05 | Discharge: 2019-03-05 | Disposition: A | Discharge status: Home / Self Care | Payer: 59 | Source: Home / Self Care

## 2019-03-05 DIAGNOSIS — W5501XA Bitten by cat, initial encounter: Secondary | ICD-10-CM

## 2019-03-05 DIAGNOSIS — S81852A Open bite, left lower leg, initial encounter: Secondary | ICD-10-CM | POA: Diagnosis not present

## 2019-03-05 MED ORDER — AMOXICILLIN-POT CLAVULANATE 875-125 MG PO TABS: 1.0000 | ORAL_TABLET | Freq: Two times a day (BID) | ORAL | 0 refills | Status: AC

## 2019-03-05 MED ORDER — CEFTRIAXONE SODIUM 1 G IJ SOLR
1.0000 g | Freq: Once | INTRAMUSCULAR | Status: AC
  Administered 2019-03-05: 17:00:00 1 g via INTRAMUSCULAR

## 2019-03-05 NOTE — ED Provider Notes (Signed)
[facility]    CSN: [id number] Arrival date & time: 03/05/19  1631     History   Chief Complaint Chief Complaint  Patient presents with  . Animal Bite    HPI Mary Freeman is a 68 y.o. female.   HPI Mary Freeman is a 68 y.o. female presenting to UC with c/o cat bite to Left ankle just PTA. Pt was bit by her house cat that is UTD on it's rabies vaccine. She was bit twice before by the same cat.  Last time she was bit, in 2019, the bit turned into an abscess that needed I&D despite being started on Augmentin the same day she was bit.  Pt cleaned the wound just PTA. No medication taken. Pain is mild.  She is UTD on her tetanus.    Past Medical History:  Diagnosis Date  . Depression   . Headache    migraines  . PONV (postoperative nausea and vomiting)     There are no active problems to display for this patient.   Past Surgical History:  Procedure Laterality Date  . APPENDECTOMY    . BREAST EXCISIONAL BIOPSY Right    benign  . COLONOSCOPY    . KNEE ARTHROSCOPY Right    x3  . KNEE ARTHROSCOPY W/ MENISCAL REPAIR Left   . LUMBAR LAMINECTOMY  1986  . REDUCTION MAMMAPLASTY Bilateral    2006  . ROTATOR CUFF REPAIR Right 2012  . TONSILLECTOMY    . TUBAL LIGATION      OB History   No obstetric history on file.      Home Medications    Prior to Admission medications   Medication Sig Start Date End Date Taking? Authorizing Provider  acetaminophen-codeine (TYLENOL #3) 300-30 MG tablet Take 1-2 tablets by mouth every 6 (six) hours as needed for moderate pain. 06/24/18   Scot Jun, FNP  amoxicillin-clavulanate (AUGMENTIN) 875-125 MG tablet Take 1 tablet by mouth 2 (two) times daily for 10 days. 03/05/19 03/15/19  Noe Gens, PA-C  Calcium-Magnesium (CAL-MAG PO) Take 1 tablet by mouth daily.    [provider]  cholecalciferol (VITAMIN D) 1000 units tablet Take 1,000 Units by mouth daily.    [provider]  FLUoxetine (PROZAC)  20 MG tablet Take 20 mg by mouth daily.    [provider]  LORazepam (ATIVAN) 0.5 MG tablet Take 0.5 mg by mouth at bedtime as needed for sleep.    [provider]  Multiple Vitamins-Minerals (MULTIVITAMIN WITH MINERALS) tablet Take 1 tablet by mouth daily.    [provider]  rizatriptan (MAXALT) 10 MG tablet Take 10 mg by mouth as needed for migraine. May repeat in 2 hours if needed    [provider]  vitamin E 100 UNIT capsule Take 100 Units by mouth daily.    [provider]    Family History Family History  Problem Relation Age of Onset  . Cancer Father        lung  . Cancer Sister        breast  . Breast cancer Sister 37  . Alcoholism Mother   . Alcoholism Brother   . Cancer Sister   . Breast cancer Sister 1    Social History Social History   Tobacco Use  . Smoking status: Never Smoker  . Smokeless tobacco: Never Used  Substance Use Topics  . Alcohol use: Yes    Comment: very rare   . Drug use:  No     Allergies   Amitriptyline; Dilaudid [hydromorphone]; Diphenhydramine; Stadol [butorphanol]; Talwin [pentazocine]; Benadryl [diphenhydramine hcl]; Solu-medrol [methylprednisolone]; Butorphanol tartrate; Hydrocodone-acetaminophen; Hydromorphone hcl; Morphine and related; Other; Oxycodone-acetaminophen; Phenergan [promethazine]; and Sulfa antibiotics   Review of Systems Review of Systems  Musculoskeletal: Negative for arthralgias and joint swelling.  Skin: Positive for wound. Negative for color change.     Physical Exam Triage Vital Signs ED Triage Vitals  Enc Vitals Group     BP 03/05/19 1655 119/69     Pulse Rate 03/05/19 1655 87     Resp 03/05/19 1655 20     Temp 03/05/19 1655 (!) 97.5 F (36.4 C)     Temp Source 03/05/19 1655 Oral     SpO2 03/05/19 1655 98 %     Weight 03/05/19 1656 140 lb (63.5 kg)     Height 03/05/19 1656 5\' 7"  (1.702 m)     Head Circumference --      Peak Flow --      Pain Score  03/05/19 1656 7     Pain Loc --      Pain Edu? --      Excl. in [place]? --    No data found.  Updated Vital Signs BP 119/69 (BP Location: Right Arm)   Pulse 87   Temp (!) 97.5 F (36.4 C) (Oral)   Resp 20   Ht 5\' 7"  (1.702 m)   Wt 140 lb (63.5 kg)   SpO2 98%   BMI 21.93 kg/m   Visual Acuity Right Eye Distance:   Left Eye Distance:   Bilateral Distance:    Right Eye Near:   Left Eye Near:    Bilateral Near:     Physical Exam Vitals signs and nursing note reviewed.  Constitutional:      Appearance: Normal appearance. She is well-developed.  HENT:     Head: Normocephalic and atraumatic.  Neck:     Musculoskeletal: Normal range of motion.  Cardiovascular:     Rate and Rhythm: Normal rate.  Pulmonary:     Effort: Pulmonary effort is normal.  Musculoskeletal: Normal range of motion.        General: No swelling or tenderness.     Comments: Left ankle: no edema, full ROM.   Skin:    General: Skin is warm and dry.     Comments: Left lateral ankle: 4 puncture wounds. Oozing red blood. No foreign bodies seen or palpated. No erythema or red streaking.   Neurological:     Mental Status: She is alert and oriented to person, place, and time.  Psychiatric:        Behavior: Behavior normal.      UC Treatments / Results  Labs (all labs ordered are listed, but only abnormal results are displayed) Labs Reviewed - No data to display  EKG None  Radiology No results found.  Procedures Procedures (including critical care time)  Medications Ordered in UC Medications  cefTRIAXone (ROCEPHIN) injection 1 g (1 g Intramuscular Given 03/05/19 1700)    Initial Impression / Assessment and Plan / UC Course  I have reviewed the triage vital signs and the nursing notes.  Pertinent labs & imaging results that were available during my care of the patient were reviewed by me and considered in my medical decision making (see chart for details).     Wound cleaned with hibiclens  Bacitracin and bandage applied. Due to severity of infection from last cat bite, will start pt on Augmentin,  but also give 1st dose of Rocephin 1g IM in UC today. Encouraged close follow up if concern for redness, swelling, fever, or other signs of worsening infection.  Final Clinical Impressions(s) / UC Diagnoses   Final diagnoses:  Cat bite of left lower leg, initial encounter     Discharge Instructions      Keep wound clean with warm water and mild soap.  You may allow the shower to continuously run over the wound to help make sure it is flushed out as much as possible.  Please take antibiotics as prescribed and be sure to complete entire course even if you start to feel better to ensure infection does not come back.  You may call our office tomorrow or return for recheck of symptoms if concerned the area is getting worse.      ED Prescriptions    Medication Sig Dispense Auth. Provider   amoxicillin-clavulanate (AUGMENTIN) 875-125 MG tablet Take 1 tablet by mouth 2 (two) times daily for 10 days. 20 tablet Noe Gens, PA-C     Controlled Substance Prescriptions [place] Controlled Substance Registry consulted? Not Applicable   Tyrell Antonio 03/05/19 1714

## 2019-03-05 NOTE — Discharge Instructions (Signed)
°  Keep wound clean with warm water and mild soap.  You may allow the shower to continuously run over the wound to help make sure it is flushed out as much as possible.  Please take antibiotics as prescribed and be sure to complete entire course even if you start to feel better to ensure infection does not come back.  You may call our office tomorrow or return for recheck of symptoms if concerned the area is getting worse.

## 2019-03-05 NOTE — ED Triage Notes (Signed)
Pt was bit by cat on lower left leg about 30 minutes ago.

## 2019-03-06 ENCOUNTER — Other Ambulatory Visit: Payer: Self-pay

## 2019-03-06 ENCOUNTER — Telehealth: Payer: Self-pay | Admitting: Emergency Medicine

## 2019-03-06 ENCOUNTER — Emergency Department (INDEPENDENT_AMBULATORY_CARE_PROVIDER_SITE_OTHER)
Admission: EM | Admit: 2019-03-06 | Discharge: 2019-03-06 | Disposition: A | Discharge status: Home / Self Care | Payer: 59 | Source: Home / Self Care | Attending: Family Medicine | Admitting: Family Medicine

## 2019-03-06 DIAGNOSIS — W5501XD Bitten by cat, subsequent encounter: Secondary | ICD-10-CM

## 2019-03-06 DIAGNOSIS — L089 Local infection of the skin and subcutaneous tissue, unspecified: Secondary | ICD-10-CM | POA: Diagnosis not present

## 2019-03-06 DIAGNOSIS — S81852D Open bite, left lower leg, subsequent encounter: Secondary | ICD-10-CM

## 2019-03-06 MED ORDER — METRONIDAZOLE 500 MG PO TABS: 500.0000 mg | ORAL_TABLET | Freq: Three times a day (TID) | ORAL | 0 refills | Status: AC

## 2019-03-06 MED ORDER — CEFTRIAXONE SODIUM 1 G IJ SOLR
1000.0000 mg | Freq: Once | INTRAMUSCULAR | Status: AC
  Administered 2019-03-06: 1000 mg via INTRAMUSCULAR

## 2019-03-06 MED ORDER — MUPIROCIN 2 % EX OINT: 1.0000 "application " | TOPICAL_OINTMENT | Freq: Three times a day (TID) | CUTANEOUS | 0 refills | Status: DC

## 2019-03-06 NOTE — Telephone Encounter (Signed)
Patient called to report that she is allergic to Flagyl; notified dr. Assunta Found since he had ordered it for her today.

## 2019-03-06 NOTE — Telephone Encounter (Signed)
Called patient to tell her to simply not fill rx for flagyl; take other rxs and follow treatment suggestions; notify us or her PCP if she is not improving per expectation. 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

## 2019-03-06 NOTE — ED Provider Notes (Addendum)
[facility]    CSN: [id number] Arrival date & time: 03/06/19  1602     History   Chief Complaint Chief Complaint  Patient presents with  . Follow-up    HPI Mary Freeman is a 68 y.o. female.   Patient returns for follow-up of a cat bite on her left lower anterior leg, presently taking Augmentin.  She received Rocephin 1gm yesterday.  She reports that she still has soreness around the wounds today but there has been no drainage.  She feels well otherwise; no fevers, chills, and sweats.  The history is provided by the patient.    Past Medical History:  Diagnosis Date  . Depression   . Headache    migraines  . PONV (postoperative nausea and vomiting)     There are no active problems to display for this patient.   Past Surgical History:  Procedure Laterality Date  . APPENDECTOMY    . BREAST EXCISIONAL BIOPSY Right    benign  . COLONOSCOPY    . KNEE ARTHROSCOPY Right    x3  . KNEE ARTHROSCOPY W/ MENISCAL REPAIR Left   . LUMBAR LAMINECTOMY  1986  . REDUCTION MAMMAPLASTY Bilateral    2006  . ROTATOR CUFF REPAIR Right 2012  . TONSILLECTOMY    . TUBAL LIGATION      OB History   No obstetric history on file.      Home Medications    Prior to Admission medications   Medication Sig Start Date End Date Taking? Authorizing Provider  acetaminophen-codeine (TYLENOL #3) 300-30 MG tablet Take 1-2 tablets by mouth every 6 (six) hours as needed for moderate pain. 06/24/18   Scot Jun, FNP  amoxicillin-clavulanate (AUGMENTIN) 875-125 MG tablet Take 1 tablet by mouth 2 (two) times daily for 10 days. 03/05/19 03/15/19  Noe Gens, PA-C  Calcium-Magnesium (CAL-MAG PO) Take 1 tablet by mouth daily.    [provider]  cholecalciferol (VITAMIN D) 1000 units tablet Take 1,000 Units by mouth daily.    [provider]  FLUoxetine (PROZAC) 20 MG tablet Take 20 mg by mouth daily.    [provider]  LORazepam (ATIVAN) 0.5 MG tablet  Take 0.5 mg by mouth at bedtime as needed for sleep.    [provider]  metroNIDAZOLE (FLAGYL) 500 MG tablet Take 1 tablet (500 mg total) by mouth 3 (three) times daily for 9 days. 03/06/19 03/15/19  Kandra Nicolas, MD  Multiple Vitamins-Minerals (MULTIVITAMIN WITH MINERALS) tablet Take 1 tablet by mouth daily.    [provider]  mupirocin ointment (BACTROBAN) 2 % Apply 1 application topically 3 (three) times daily. 03/06/19   Kandra Nicolas, MD  rizatriptan (MAXALT) 10 MG tablet Take 10 mg by mouth as needed for migraine. May repeat in 2 hours if needed    [provider]  vitamin E 100 UNIT capsule Take 100 Units by mouth daily.    [provider]    Family History Family History  Problem Relation Age of Onset  . Cancer Father        lung  . Cancer Sister        breast  . Breast cancer Sister 70  . Alcoholism Mother   . Alcoholism Brother   . Cancer Sister   . Breast cancer Sister 99    Social History Social History   Tobacco Use  . Smoking status: Never Smoker  . Smokeless tobacco: Never Used  Substance Use Topics  .  Alcohol use: Yes    Comment: very rare   . Drug use: No     Allergies   Amitriptyline; Dilaudid [hydromorphone]; Diphenhydramine; Stadol [butorphanol]; Talwin [pentazocine]; Benadryl [diphenhydramine hcl]; Solu-medrol [methylprednisolone]; Butorphanol tartrate; Hydrocodone-acetaminophen; Hydromorphone hcl; Morphine and related; Other; Oxycodone-acetaminophen; Phenergan [promethazine]; and Sulfa antibiotics   Review of Systems Review of Systems  Constitutional: Negative for chills, diaphoresis, fatigue and fever.  Musculoskeletal:       Mild soreness left lower leg.  Skin: Positive for color change.  All other systems reviewed and are negative.    Physical Exam Triage Vital Signs ED Triage Vitals  Enc Vitals Group     BP 03/06/19 1648 104/63     Pulse Rate 03/06/19 1648 64     Resp 03/06/19 1648 18     Temp  03/06/19 1648 97.8 F (36.6 C)     Temp Source 03/06/19 1648 Oral     SpO2 03/06/19 1648 99 %     Weight --      Height --      Head Circumference --      Peak Flow --      Pain Score 03/06/19 1649 0     Pain Loc --      Pain Edu? --      Excl. in [place]? --    No data found.  Updated Vital Signs BP 104/63 (BP Location: Right Arm)   Pulse 64   Temp 97.8 F (36.6 C) (Oral)   Resp 18   SpO2 99%   Visual Acuity Right Eye Distance:   Left Eye Distance:   Bilateral Distance:    Right Eye Near:   Left Eye Near:    Bilateral Near:     Physical Exam Vitals signs and nursing note reviewed.  Constitutional:      General: She is not in acute distress. HENT:     Head: Normocephalic.  Cardiovascular:     Rate and Rhythm: Normal rate.  Pulmonary:     Effort: Pulmonary effort is normal.  Musculoskeletal:     Right lower leg: She exhibits tenderness. She exhibits no bony tenderness and no swelling. No edema.     Left lower leg: No edema.       Legs:     Comments: Left lower leg pretibial area has persistent mild erythema and tenderness around four puncture wounds.  There is no swelling, and no drainage from the wounds.  Skin:    General: Skin is warm and dry.  Neurological:     Mental Status: She is alert.      UC Treatments / Results  Labs (all labs ordered are listed, but only abnormal results are displayed) Labs Reviewed - No data to display  EKG None  Radiology No results found.  Procedures Procedures (including critical care time)  Medications Ordered in UC Medications  cefTRIAXone (ROCEPHIN) injection 1,000 mg (has no administration in time range)    Initial Impression / Assessment and Plan / UC Course  I have reviewed the triage vital signs and the nursing notes.  Pertinent labs & imaging results that were available during my care of the patient were reviewed by me and considered in my medical decision making (see chart for details).    Rocephin 1gm  IM.  Add Flagyl for anaerobic coverage. Recommend taking probiotic. Call PCP if not improving one week. Rx given for Bactroban ointment to begin applying immediately for any future cat bite   Final Clinical Impressions(s) /  UC Diagnoses   Final diagnoses:  Cat bite of left lower leg with infection, subsequent encounter     Discharge Instructions     Continue Augmentin. May apply heating pad 2 or 3 times daily.  Elevate leg when possible.  Wear support hose daytime.  If symptoms become significantly worse during the night or over the weekend, proceed to the local emergency room.     ED Prescriptions    Medication Sig Dispense Auth. Provider   metroNIDAZOLE (FLAGYL) 500 MG tablet Take 1 tablet (500 mg total) by mouth 3 (three) times daily for 9 days. 27 tablet Kandra Nicolas, MD   mupirocin ointment (BACTROBAN) 2 % Apply 1 application topically 3 (three) times daily. 15 g Kandra Nicolas, MD         Kandra Nicolas, MD 03/07/19 1442   Call from pharmacist:  Patient reports allergy to Flagyl (although not in her list of allergies).  Flagyl discontinued.   Kandra Nicolas, MD 03/07/19 1444

## 2019-03-06 NOTE — ED Triage Notes (Signed)
Here for follow up on cat bite 03/05/19. Feels it has already started the healing process with antibiotics given here and at home.

## 2019-03-06 NOTE — Discharge Instructions (Addendum)
Continue Augmentin. May apply heating pad 2 or 3 times daily.  Elevate leg when possible.  Wear support hose daytime.  If symptoms become significantly worse during the night or over the weekend, proceed to the local emergency room.

## 2019-03-08 DIAGNOSIS — M9903 Segmental and somatic dysfunction of lumbar region: Secondary | ICD-10-CM | POA: Diagnosis not present

## 2019-03-08 DIAGNOSIS — M9904 Segmental and somatic dysfunction of sacral region: Secondary | ICD-10-CM | POA: Diagnosis not present

## 2019-03-08 DIAGNOSIS — M531 Cervicobrachial syndrome: Secondary | ICD-10-CM | POA: Diagnosis not present

## 2019-03-08 DIAGNOSIS — M6080 Other myositis, unspecified site: Secondary | ICD-10-CM | POA: Diagnosis not present

## 2019-03-08 DIAGNOSIS — M9901 Segmental and somatic dysfunction of cervical region: Secondary | ICD-10-CM | POA: Diagnosis not present

## 2019-03-08 DIAGNOSIS — M9902 Segmental and somatic dysfunction of thoracic region: Secondary | ICD-10-CM | POA: Diagnosis not present

## 2019-03-27 DIAGNOSIS — M9902 Segmental and somatic dysfunction of thoracic region: Secondary | ICD-10-CM | POA: Diagnosis not present

## 2019-03-27 DIAGNOSIS — M6080 Other myositis, unspecified site: Secondary | ICD-10-CM | POA: Diagnosis not present

## 2019-03-27 DIAGNOSIS — M9901 Segmental and somatic dysfunction of cervical region: Secondary | ICD-10-CM | POA: Diagnosis not present

## 2019-03-27 DIAGNOSIS — M531 Cervicobrachial syndrome: Secondary | ICD-10-CM | POA: Diagnosis not present

## 2019-03-27 DIAGNOSIS — M9904 Segmental and somatic dysfunction of sacral region: Secondary | ICD-10-CM | POA: Diagnosis not present

## 2019-03-27 DIAGNOSIS — M9903 Segmental and somatic dysfunction of lumbar region: Secondary | ICD-10-CM | POA: Diagnosis not present

## 2019-05-21 MED FILL — valACYclovir HCL 1 GM TABS: 1 | 8 days supply | Qty: 30 | Fill #0

## 2019-05-25 ENCOUNTER — Other Ambulatory Visit: Payer: Self-pay

## 2019-05-25 ENCOUNTER — Encounter: Payer: Self-pay | Admitting: *Deleted

## 2019-05-25 ENCOUNTER — Emergency Department (INDEPENDENT_AMBULATORY_CARE_PROVIDER_SITE_OTHER)
Admission: EM | Admit: 2019-05-25 | Discharge: 2019-05-25 | Disposition: A | Discharge status: Home / Self Care | Payer: 59 | Source: Home / Self Care | Attending: Emergency Medicine | Admitting: Emergency Medicine

## 2019-05-25 DIAGNOSIS — L089 Local infection of the skin and subcutaneous tissue, unspecified: Secondary | ICD-10-CM | POA: Diagnosis not present

## 2019-05-25 DIAGNOSIS — S81852D Open bite, left lower leg, subsequent encounter: Secondary | ICD-10-CM

## 2019-05-25 DIAGNOSIS — W5501XA Bitten by cat, initial encounter: Secondary | ICD-10-CM

## 2019-05-25 MED ORDER — CEFTRIAXONE SODIUM 1 G IJ SOLR
1000.0000 mg | Freq: Once | INTRAMUSCULAR | Status: AC
  Administered 2019-05-25: 1000 mg via INTRAMUSCULAR

## 2019-05-25 MED ORDER — AMOXICILLIN-POT CLAVULANATE 875-125 MG PO TABS: 1.0000 | ORAL_TABLET | Freq: Two times a day (BID) | ORAL | 0 refills | Status: DC

## 2019-05-25 NOTE — ED Provider Notes (Signed)
[facility]    CSN: [id number] Arrival date & time: 05/25/19  1140     History   Chief Complaint Chief Complaint  Patient presents with  . Animal Bite    HPI Mary Freeman is a 68 y.o. female.   HPI Patient was in her usual state of health until 1 hour ago when she was at home and her cat was upset and anxious when she walked by and bit her on her right lateral calf.  She had a tetanus shot last year.  Her cat is up-to-date on shots and is a totally inside cat.  This is the third time the cat has bitten her. Past Medical History:  Diagnosis Date  . Depression   . Headache    migraines  . PONV (postoperative nausea and vomiting)     There are no active problems to display for this patient.   Past Surgical History:  Procedure Laterality Date  . APPENDECTOMY    . BREAST EXCISIONAL BIOPSY Right    benign  . COLONOSCOPY    . KNEE ARTHROSCOPY Right    x3  . KNEE ARTHROSCOPY W/ MENISCAL REPAIR Left   . LUMBAR LAMINECTOMY  1986  . REDUCTION MAMMAPLASTY Bilateral    2006  . ROTATOR CUFF REPAIR Right 2012  . TONSILLECTOMY    . TUBAL LIGATION      OB History   No obstetric history on file.      Home Medications    Prior to Admission medications   Medication Sig Start Date End Date Taking? Authorizing Provider  amoxicillin-clavulanate (AUGMENTIN) 875-125 MG tablet Take 1 tablet by mouth every 12 (twelve) hours. 05/25/19   Darlyne Russian, MD  Calcium-Magnesium (CAL-MAG PO) Take 1 tablet by mouth daily.    [provider]  cholecalciferol (VITAMIN D) 1000 units tablet Take 1,000 Units by mouth daily.    [provider]  FLUoxetine (PROZAC) 20 MG tablet Take 20 mg by mouth daily.    [provider]  LORazepam (ATIVAN) 0.5 MG tablet Take 0.5 mg by mouth at bedtime as needed for sleep.    [provider]  Multiple Vitamins-Minerals (MULTIVITAMIN WITH MINERALS) tablet Take 1 tablet by mouth daily.    [provider]  mupirocin ointment (BACTROBAN) 2 % Apply 1 application topically 3 (three) times daily. 03/06/19   Kandra Nicolas, MD  rizatriptan (MAXALT) 10 MG tablet Take 10 mg by mouth as needed for migraine. May repeat in 2 hours if needed    [provider]  vitamin E 100 UNIT capsule Take 100 Units by mouth daily.    [provider]    Family History Family History  Problem Relation Age of Onset  . Cancer Father        lung  . Cancer Sister        breast  . Breast cancer Sister 75  . Alcoholism Mother   . Alcoholism Brother   . Cancer Sister   . Breast cancer Sister 10    Social History Social History   Tobacco Use  . Smoking status: Never Smoker  . Smokeless tobacco: Never Used  Substance Use Topics  . Alcohol use: Yes    Comment: very rare   . Drug use: No     Allergies   Amitriptyline, Dilaudid [hydromorphone], Diphenhydramine, Stadol [butorphanol], Talwin [pentazocine], Benadryl [diphenhydramine hcl], Flagyl [metronidazole], Solu-medrol [methylprednisolone], Butorphanol tartrate, Hydrocodone-acetaminophen, Hydromorphone hcl, Morphine and related, Other, Oxycodone-acetaminophen, Phenergan [promethazine], and  Sulfa antibiotics   Review of Systems Review of Systems  Constitutional: Negative.   Musculoskeletal:       Patient has pain and swelling upper right lower leg.     Physical Exam Triage Vital Signs ED Triage Vitals  Enc Vitals Group     BP 05/25/19 1156 129/75     Pulse Rate 05/25/19 1156 73     Resp 05/25/19 1156 16     Temp 05/25/19 1156 97.6 F (36.4 C)     Temp Source 05/25/19 1156 Oral     SpO2 05/25/19 1156 98 %     Weight 05/25/19 1158 145 lb (65.8 kg)     Height 05/25/19 1158 5' 7.5" (1.715 m)     Head Circumference --      Peak Flow --      Pain Score 05/25/19 1158 6     Pain Loc --      Pain Edu? --      Excl. in [place]? --    No data found.  Updated Vital Signs BP 129/75 (BP Location: Right Arm)   Pulse 73   Temp 97.6 F  (36.4 C) (Oral)   Resp 16   Ht 5' 7.5" (1.715 m)   Wt 65.8 kg   SpO2 98%   BMI 22.38 kg/m   Visual Acuity Right Eye Distance:   Left Eye Distance:   Bilateral Distance:    Right Eye Near:   Left Eye Near:    Bilateral Near:     Physical Exam Constitutional:      Appearance: Normal appearance.  Skin:    Comments: There are 4 puncture wounds over the lateral right calf.  There is oozing of serosanguineous material.  There is no purulence.  Distally there is no redness, pulses are symmetrical, sensation intact.  Neurological:     General: No focal deficit present.     Motor: No weakness.     Coordination: Coordination normal.      UC Treatments / Results  Labs (all labs ordered are listed, but only abnormal results are displayed) Labs Reviewed - No data to display  EKG None  Radiology No results found.  Procedures Procedures (including critical care time)  Medications Ordered in UC Medications  cefTRIAXone (ROCEPHIN) injection 1,000 mg (1,000 mg Intramuscular Given 05/25/19 1209)    Initial Impression / Assessment and Plan / UC Course  I have reviewed the triage vital signs and the nursing notes.  Pertinent labs & imaging results that were available during my care of the patient were reviewed by me and considered in my medical decision making (see chart for details). Patient has a cat bite of her right calf.  She has had difficulty with cellulitis in the past.  She has responded well to Rocephin and Augmentin in the past.  She is requesting 14 days of the Augmentin because it has required that in the past.  She has never had any difficulty with prolonged Augmentin.  I did encourage her to use probiotics. She had cleaned the wound prior to arrival but wanted to leave and said she was going straight home to clean the wound with soap and water herself.     Final Clinical Impressions(s) / UC Diagnoses   Final diagnoses:  Cat bite, initial encounter      Discharge Instructions     Clean area with soap and water 3 times a day. Do not apply any ointment. If you have progressive redness or increasing pain  associated with fever despite being on your antibiotics please return to clinic immediately for reevaluation.    ED Prescriptions    Medication Sig Dispense Auth. Provider   amoxicillin-clavulanate (AUGMENTIN) 875-125 MG tablet Take 1 tablet by mouth every 12 (twelve) hours. 28 tablet Darlyne Russian, MD     Controlled Substance Prescriptions [place] Controlled Substance Registry consulted? Not Applicable   Darlyne Russian, MD 05/25/19 1228

## 2019-05-25 NOTE — ED Triage Notes (Signed)
Pt c/o cat bite to RT calf x 1100.

## 2019-05-25 NOTE — Discharge Instructions (Addendum)
Clean area with soap and water 3 times a day. Do not apply any ointment. If you have progressive redness or increasing pain associated with fever despite being on your antibiotics please return to clinic immediately for reevaluation.

## 2019-05-29 DIAGNOSIS — M9902 Segmental and somatic dysfunction of thoracic region: Secondary | ICD-10-CM | POA: Diagnosis not present

## 2019-05-29 DIAGNOSIS — M531 Cervicobrachial syndrome: Secondary | ICD-10-CM | POA: Diagnosis not present

## 2019-05-29 DIAGNOSIS — M9901 Segmental and somatic dysfunction of cervical region: Secondary | ICD-10-CM | POA: Diagnosis not present

## 2019-05-29 DIAGNOSIS — M9903 Segmental and somatic dysfunction of lumbar region: Secondary | ICD-10-CM | POA: Diagnosis not present

## 2019-05-29 DIAGNOSIS — M9904 Segmental and somatic dysfunction of sacral region: Secondary | ICD-10-CM | POA: Diagnosis not present

## 2019-05-29 DIAGNOSIS — M6080 Other myositis, unspecified site: Secondary | ICD-10-CM | POA: Diagnosis not present

## 2019-06-03 DIAGNOSIS — S81811A Laceration without foreign body, right lower leg, initial encounter: Secondary | ICD-10-CM | POA: Diagnosis not present

## 2019-06-06 DIAGNOSIS — S81851D Open bite, right lower leg, subsequent encounter: Secondary | ICD-10-CM | POA: Diagnosis not present

## 2019-06-06 DIAGNOSIS — W5501XD Bitten by cat, subsequent encounter: Secondary | ICD-10-CM | POA: Diagnosis not present

## 2019-06-06 DIAGNOSIS — L600 Ingrowing nail: Secondary | ICD-10-CM | POA: Diagnosis not present

## 2019-06-06 DIAGNOSIS — L03115 Cellulitis of right lower limb: Secondary | ICD-10-CM | POA: Diagnosis not present

## 2019-06-06 DIAGNOSIS — M79675 Pain in left toe(s): Secondary | ICD-10-CM | POA: Diagnosis not present

## 2019-06-06 MED FILL — FLUoxetine HCL 20 MG CAPS: 20 | 90 days supply | Qty: 90 | Fill #1

## 2019-06-06 MED FILL — LORazepam 1 MG TABS: 1 | 90 days supply | Qty: 90 | Fill #0

## 2019-06-28 DIAGNOSIS — M9904 Segmental and somatic dysfunction of sacral region: Secondary | ICD-10-CM | POA: Diagnosis not present

## 2019-06-28 DIAGNOSIS — M6080 Other myositis, unspecified site: Secondary | ICD-10-CM | POA: Diagnosis not present

## 2019-06-28 DIAGNOSIS — M531 Cervicobrachial syndrome: Secondary | ICD-10-CM | POA: Diagnosis not present

## 2019-06-28 DIAGNOSIS — M9903 Segmental and somatic dysfunction of lumbar region: Secondary | ICD-10-CM | POA: Diagnosis not present

## 2019-06-28 DIAGNOSIS — M9902 Segmental and somatic dysfunction of thoracic region: Secondary | ICD-10-CM | POA: Diagnosis not present

## 2019-06-28 DIAGNOSIS — M9901 Segmental and somatic dysfunction of cervical region: Secondary | ICD-10-CM | POA: Diagnosis not present

## 2019-07-02 DIAGNOSIS — T25622D Corrosion of second degree of left foot, subsequent encounter: Secondary | ICD-10-CM | POA: Diagnosis not present

## 2019-07-02 DIAGNOSIS — W5501XD Bitten by cat, subsequent encounter: Secondary | ICD-10-CM | POA: Diagnosis not present

## 2019-07-02 DIAGNOSIS — L03115 Cellulitis of right lower limb: Secondary | ICD-10-CM | POA: Diagnosis not present

## 2019-07-02 DIAGNOSIS — S81851D Open bite, right lower leg, subsequent encounter: Secondary | ICD-10-CM | POA: Diagnosis not present

## 2019-07-02 DIAGNOSIS — L600 Ingrowing nail: Secondary | ICD-10-CM | POA: Diagnosis not present

## 2019-07-10 DIAGNOSIS — H5213 Myopia, bilateral: Secondary | ICD-10-CM | POA: Diagnosis not present

## 2019-07-29 DIAGNOSIS — M9903 Segmental and somatic dysfunction of lumbar region: Secondary | ICD-10-CM | POA: Diagnosis not present

## 2019-07-29 DIAGNOSIS — M6080 Other myositis, unspecified site: Secondary | ICD-10-CM | POA: Diagnosis not present

## 2019-07-29 DIAGNOSIS — M531 Cervicobrachial syndrome: Secondary | ICD-10-CM | POA: Diagnosis not present

## 2019-07-29 DIAGNOSIS — M9904 Segmental and somatic dysfunction of sacral region: Secondary | ICD-10-CM | POA: Diagnosis not present

## 2019-07-29 DIAGNOSIS — M9902 Segmental and somatic dysfunction of thoracic region: Secondary | ICD-10-CM | POA: Diagnosis not present

## 2019-07-29 DIAGNOSIS — M9901 Segmental and somatic dysfunction of cervical region: Secondary | ICD-10-CM | POA: Diagnosis not present

## 2019-07-30 ENCOUNTER — Other Ambulatory Visit: Payer: Self-pay | Admitting: Occupational Medicine

## 2019-07-30 MED FILL — ACETAMINOPHEN/COD #3 TABLET: 300-30 | 20 days supply | Qty: 20 | Fill #0

## 2019-07-31 DIAGNOSIS — M9902 Segmental and somatic dysfunction of thoracic region: Secondary | ICD-10-CM | POA: Diagnosis not present

## 2019-07-31 DIAGNOSIS — M9901 Segmental and somatic dysfunction of cervical region: Secondary | ICD-10-CM | POA: Diagnosis not present

## 2019-07-31 DIAGNOSIS — M6080 Other myositis, unspecified site: Secondary | ICD-10-CM | POA: Diagnosis not present

## 2019-07-31 DIAGNOSIS — M9904 Segmental and somatic dysfunction of sacral region: Secondary | ICD-10-CM | POA: Diagnosis not present

## 2019-07-31 DIAGNOSIS — M531 Cervicobrachial syndrome: Secondary | ICD-10-CM | POA: Diagnosis not present

## 2019-07-31 DIAGNOSIS — M9903 Segmental and somatic dysfunction of lumbar region: Secondary | ICD-10-CM | POA: Diagnosis not present

## 2019-08-09 ENCOUNTER — Other Ambulatory Visit: Payer: Self-pay

## 2019-08-09 ENCOUNTER — Ambulatory Visit (INDEPENDENT_AMBULATORY_CARE_PROVIDER_SITE_OTHER): Payer: PRIVATE HEALTH INSURANCE | Admitting: Rehabilitative and Restorative Service Providers"

## 2019-08-09 ENCOUNTER — Encounter: Payer: Self-pay | Admitting: Rehabilitative and Restorative Service Providers"

## 2019-08-09 DIAGNOSIS — M79605 Pain in left leg: Secondary | ICD-10-CM

## 2019-08-09 DIAGNOSIS — M545 Low back pain, unspecified: Secondary | ICD-10-CM

## 2019-08-09 DIAGNOSIS — R531 Weakness: Secondary | ICD-10-CM

## 2019-08-09 DIAGNOSIS — R29898 Other symptoms and signs involving the musculoskeletal system: Secondary | ICD-10-CM | POA: Diagnosis not present

## 2019-08-09 NOTE — Therapy (Signed)
[place], [place], [address] Phone: [phone]   Fax:  [phone]  Physical Therapy Evaluation  Patient Details  Name: Mary Freeman MRN: [id number] Date of Birth: 1951-04-17 Referring Provider (PT): [place], [place]   Encounter Date: 08/09/2019  PT End of Session - 08/09/19 1235    Visit Number  1    Number of Visits  12    Date for PT Re-Evaluation  09/20/19    PT Start Time  1147    PT Stop Time  1238    PT Time Calculation (min)  51 min    Activity Tolerance  Patient tolerated treatment well       Past Medical History:  Diagnosis Date  . Depression   . Headache    migraines  . PONV (postoperative nausea and vomiting)     Past Surgical History:  Procedure Laterality Date  . APPENDECTOMY    . BREAST EXCISIONAL BIOPSY Right    benign  . COLONOSCOPY    . KNEE ARTHROSCOPY Right    x3  . KNEE ARTHROSCOPY W/ MENISCAL REPAIR Left   . LUMBAR LAMINECTOMY  1986  . REDUCTION MAMMAPLASTY Bilateral    2006  . ROTATOR CUFF REPAIR Right 2012  . TONSILLECTOMY    . TUBAL LIGATION      There were no vitals filed for this visit.   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 she has had continued painin the LB as well as symptoms in the mid back and Lt hip as well as difficulty moving the Lt LE.    Pertinent History  L shoulder scope; cervicalgia; back surgery disc sx L4/S1 1985    Diagnostic tests  xrays    Patient Stated Goals  get rid of the pain and return to normal functional and work activities    Currently in Pain?  Yes    Pain Score  4     Pain Location  Back    Pain Orientation  Left;Right;Mid;Lower    Pain Descriptors / Indicators  Aching    Pain Type  Acute pain    Pain Radiating Towards  into Lt hip area    Pain Onset  1 to 4 weeks ago    Pain Frequency  Constant    Aggravating  Factors   walking increased leg pain; sitting increased hip pain    Pain Relieving Factors  heat; ice         [facility] PT Assessment - 08/09/19 0001      Assessment   Medical Diagnosis  Lumbar radiculopathy     Referring Provider (PT)  Milana Na, NP    Onset Date/Surgical Date  07/26/19    Hand Dominance  Right    Next MD Visit  08/21/2019    Prior Therapy  chiropractic x 2 for LBP; here for shoulder       Precautions   Precautions  None      Restrictions   Weight Bearing Restrictions  No      Balance Screen   Has the patient fallen in the past 6 months  No    Has the patient had a decrease in activity level because of a fear of falling?   No    Is the patient reluctant to leave their home because of a fear of falling?   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     Leisure  household chores; yard work       Observation/Other Assessments   Focus on Therapeutic Outcomes (FOTO)   53% limitation       Sensation   Additional Comments  WFL's per pt report       Posture/Postural Control   Posture Comments  head forward; shoudlers rounded flexed forward at hips       AROM   Lumbar Flexion  50% pulling     Lumbar Extension  50% no pain     Lumbar - Right Side Bend  60% pulling Lt lateral hip     Lumbar - Left Side Bend  50% pain Lt lateral hip     Lumbar - Right Rotation  40%     Lumbar - Left Rotation  35%       Strength   Overall Strength Comments  functional LE strength       Flexibility   Hamstrings  tight Lt > Rt     Quadriceps  tight Lt > Rt     ITB  tight Lt > Rt     Piriformis  tight Lt > Rt       Palpation   Palpation comment  muscular tightness noted through Lt > Rt lower thoracic to lumbar paraspinals; QL; lats; posteriior hip - piriformis and gluts;  and Lt TFL to ITB       Special Tests   Other special tests  (-) slump  test; SLR                 Objective measurements completed on examination: See above findings.      [facility] Adult PT Treatment/Exercise - 08/09/19 0001      Self-Care   Self-Care  --   initiated back care education      Lumbar Exercises: Stretches   Passive Hamstring Stretch  Left;2 reps;30 seconds   supine with strap    Hip Flexor Stretch  Left;Right;2 reps;30 seconds   seated    Press Ups  10 reps   2-3 sec pause    Quad Stretch  Left;2 reps;30 seconds   prone with strap    ITB Stretch  Left;2 reps;30 seconds   supine with strap    Piriformis Stretch  Left;2 reps;30 seconds   supine travell      Moist Heat Therapy   Number Minutes Moist Heat  15 Minutes    Moist Heat Location  Lumbar Spine      Electrical Stimulation   Electrical Stimulation Location  bilat lumbar     Electrical Stimulation Action  IFC    Electrical Stimulation Parameters  to tolerance     Electrical Stimulation Goals  Pain;Tone             PT Education - 08/09/19 1220    Education Details  HEP TENS back care POC    Person(s) Educated  Patient    Methods  Explanation;Demonstration;Tactile cues;Verbal cues;Handout    Comprehension  Verbalized understanding;Returned demonstration;Verbal cues required;Tactile cues required          PT Long Term Goals - 08/09/19 1240      PT LONG TERM GOAL #1   Title  Increase AROM lumbar spine to WFL's and pain free    Time  6  Period  Weeks    Status  New    Target Date  09/20/19      PT LONG TERM GOAL #2   Title  Decrease pain LB and Lt LE to 0/10 to 2/10 at most with all functional and work activities    Time  6    Period  Weeks    Status  New    Target Date  09/20/19      PT LONG TERM GOAL #3   Title  Improve core strength and stability with patient to demonstrate proper lifting techniques to prepare for return to work    Time  6    Period  Weeks    Status  New    Target Date  09/20/19      PT LONG TERM GOAL #4   Title   Independent in HEP    Time  6    Period  Weeks    Status  New    Target Date  09/20/19      PT LONG TERM GOAL #5   Title  Improve FOTO to </= 41% limitation    Time  6    Period  Weeks    Status  New    Target Date  09/20/19             Plan - 08/09/19 1236    Clinical Impression Statement  Patient presents with Lt > Rt mid to low back pain with tightness and aching into the Lt LE following lifting injury 07/26/2019. She has limited trunk and LE mobility/ROM; pain with palpation thoracolumbar musculature and hip/LE musculature Lt > Rt; pain limiting functional and work activities. Patient will benefit from PT to address problems identified.    Stability/Clinical Decision Making  Stable/Uncomplicated    Clinical Decision Making  Low    Rehab Potential  Good    PT Frequency  2x / week    PT Duration  6 weeks    PT Treatment/Interventions  Patient/family education;ADLs/Self Care Home Management;Cryotherapy;Electrical Stimulation;Iontophoresis 4mg /ml Dexamethasone;Moist Heat;Ultrasound;Manual techniques;Dry needling;Neuromuscular re-education;Functional mobility training;Therapeutic activities;Therapeutic exercise    PT Next Visit Plan  review HEP;; trial of DN/manual work Lt lumbar to hip musculature; core stabilization; stretching; strengthening; modalities as indicated    PT Home Exercise Plan  [email or web address]       Patient will benefit from skilled therapeutic intervention in order to improve the following deficits and impairments:  Pain, Increased fascial restricitons, Increased muscle spasms, Decreased mobility, Decreased range of motion, Decreased activity tolerance  Visit Diagnosis: Acute bilateral low back pain, unspecified whether sciatica present - Plan: PT plan of care cert/re-cert  Pain of left lower extremity - Plan: PT plan of care cert/re-cert  Other symptoms and signs involving the musculoskeletal system - Plan: PT plan of care cert/re-cert  Weakness generalized -  Plan: PT plan of care cert/re-cert     Problem List There are no active problems to display for this patient.   [place], MPH  08/09/2019, 12:52 PM  [facility] [place], [place], [address] Phone: [phone]   Fax:  [phone]  Name: Mary Freeman MRN: [id number] Date of Birth: [birth year]

## 2019-08-09 NOTE — Patient Instructions (Signed)
Access Code: [email or web address]  URL: [email or web address]  Date: 08/09/2019  Prepared by: Gillermo Murdoch   Exercises  Prone Press Up - 10 reps - 1 sets - 2-3 sec hold - 1x daily - 7x weekly  Supine Hamstring Stretch with Strap - 10 reps - 1 sets - 30 seconds hold - 2x daily - 7x weekly  Supine ITB Stretch with Strap - 3 reps - 1 sets - 30 seconds hold - 2x daily - 7x weekly  Supine Piriformis Stretch with Leg Straight - 3 reps - 1 sets - 30 seconds hold - 2x daily - 7x weekly  Prone Quadriceps Stretch with Strap - 3 reps - 1 sets - 30 seconds hold - 2x daily - 7x weekly  Seated Hip Flexor Stretch - 3 reps - 1 sets - 30 seconds hold - 2x daily - 7x weekly  Patient Education  TENS Unit  Posture and Body Mechanics

## 2019-08-12 DIAGNOSIS — M9901 Segmental and somatic dysfunction of cervical region: Secondary | ICD-10-CM | POA: Diagnosis not present

## 2019-08-12 DIAGNOSIS — M531 Cervicobrachial syndrome: Secondary | ICD-10-CM | POA: Diagnosis not present

## 2019-08-12 DIAGNOSIS — M9903 Segmental and somatic dysfunction of lumbar region: Secondary | ICD-10-CM | POA: Diagnosis not present

## 2019-08-12 DIAGNOSIS — M9904 Segmental and somatic dysfunction of sacral region: Secondary | ICD-10-CM | POA: Diagnosis not present

## 2019-08-12 DIAGNOSIS — M6080 Other myositis, unspecified site: Secondary | ICD-10-CM | POA: Diagnosis not present

## 2019-08-12 DIAGNOSIS — M9902 Segmental and somatic dysfunction of thoracic region: Secondary | ICD-10-CM | POA: Diagnosis not present

## 2019-08-13 ENCOUNTER — Ambulatory Visit (INDEPENDENT_AMBULATORY_CARE_PROVIDER_SITE_OTHER): Payer: PRIVATE HEALTH INSURANCE | Admitting: Physical Therapy

## 2019-08-13 ENCOUNTER — Encounter: Payer: Self-pay | Admitting: Physical Therapy

## 2019-08-13 ENCOUNTER — Other Ambulatory Visit: Payer: Self-pay

## 2019-08-13 DIAGNOSIS — M79605 Pain in left leg: Secondary | ICD-10-CM

## 2019-08-13 DIAGNOSIS — M545 Low back pain, unspecified: Secondary | ICD-10-CM

## 2019-08-13 DIAGNOSIS — R29898 Other symptoms and signs involving the musculoskeletal system: Secondary | ICD-10-CM

## 2019-08-13 NOTE — Therapy (Signed)
[place], [place], [address] Phone: [phone]   Fax:  [phone]  Physical Therapy Treatment  Patient Details  Name: Mary Freeman MRN: [id number] Date of Birth: 1951-03-07 Referring Provider (PT): [place], [place]   Encounter Date: 08/13/2019  PT End of Session - 08/13/19 1232    Visit Number  2    Number of Visits  12    Date for PT Re-Evaluation  09/20/19    PT Start Time  1148    PT Stop Time  1230    PT Time Calculation (min)  42 min    Activity Tolerance  Patient tolerated treatment well    Behavior During Therapy  [facility] for tasks assessed/performed       Past Medical History:  Diagnosis Date  . Depression   . Headache    migraines  . PONV (postoperative nausea and vomiting)     Past Surgical History:  Procedure Laterality Date  . APPENDECTOMY    . BREAST EXCISIONAL BIOPSY Right    benign  . COLONOSCOPY    . KNEE ARTHROSCOPY Right    x3  . KNEE ARTHROSCOPY W/ MENISCAL REPAIR Left   . LUMBAR LAMINECTOMY  1986  . REDUCTION MAMMAPLASTY Bilateral    2006  . ROTATOR CUFF REPAIR Right 2012  . TONSILLECTOMY    . TUBAL LIGATION      There were no vitals filed for this visit.  Subjective Assessment - 08/13/19 1150    Subjective  ice has been helpful; interested in DN today.  having some radiating pain into LLE to calf/knee    Pertinent History  L shoulder scope; cervicalgia; back surgery disc sx L4/S1 1985    Diagnostic tests  xrays    Patient Stated Goals  get rid of the pain and return to normal functional and work activities    Currently in Pain?  Yes    Pain Score  4     Pain Location  Back    Pain Orientation  Left    Pain Descriptors / Indicators  Aching    Pain Type  Acute pain    Pain Radiating Towards  in LLE to calf/foot    Pain Onset  1 to 4 weeks ago    Pain Frequency  Constant    Aggravating Factors   walking increased leg pain, sitting increased hip pain     Pain Relieving Factors  heat, ice                       [facility] Adult PT Treatment/Exercise - 08/13/19 1152      Lumbar Exercises: Stretches   ITB Stretch  Left;2 reps;30 seconds   supine with strap    Piriformis Stretch  Left;2 reps;30 seconds   supine travell      Lumbar Exercises: Aerobic   Nustep  L4 x 6 min      Manual Therapy   Manual Therapy  Soft tissue mobilization    Manual therapy comments  pt prone    Soft tissue mobilization  Lt thoracic/lumbar paraspinals into glutes/piriformis       Trigger Point Dry Needling - 08/13/19 1227    Consent Given?  Yes    Education Handout Provided  Yes    Muscles Treated Back/Hip  Gluteus medius;Gluteus maximus;Gluteus minimus    Gluteus Minimus Response  Twitch response elicited;Palpable increased muscle length    Gluteus Medius Response  Twitch response  elicited;Palpable increased muscle length    Gluteus Maximus Response  Twitch response elicited;Palpable increased muscle length                PT Long Term Goals - 08/09/19 1240      PT LONG TERM GOAL #1   Title  Increase AROM lumbar spine to WFL's and pain free    Time  6    Period  Weeks    Status  New    Target Date  09/20/19      PT LONG TERM GOAL #2   Title  Decrease pain LB and Lt LE to 0/10 to 2/10 at most with all functional and work activities    Time  6    Period  Weeks    Status  New    Target Date  09/20/19      PT LONG TERM GOAL #3   Title  Improve core strength and stability with patient to demonstrate proper lifting techniques to prepare for return to work    Time  6    Period  Weeks    Status  New    Target Date  09/20/19      PT LONG TERM GOAL #4   Title  Independent in HEP    Time  6    Period  Weeks    Status  New    Target Date  09/20/19      PT LONG TERM GOAL #5   Title  Improve FOTO to </= 41% limitation    Time  6    Period  Weeks    Status  New    Target Date  09/20/19            Plan - 08/13/19  1232    Clinical Impression Statement  Pt reported no pain after session today which included DN and manual therapy.  Overall pt slowly progressing with PT.    Stability/Clinical Decision Making  Stable/Uncomplicated    Rehab Potential  Good    PT Frequency  2x / week    PT Duration  6 weeks    PT Treatment/Interventions  Patient/family education;ADLs/Self Care Home Management;Cryotherapy;Electrical Stimulation;Iontophoresis 4mg /ml Dexamethasone;Moist Heat;Ultrasound;Manual techniques;Dry needling;Neuromuscular re-education;Functional mobility training;Therapeutic activities;Therapeutic exercise    PT Next Visit Plan  review HEP;; trial of DN/manual work Lt lumbar to hip musculature; core stabilization; stretching; strengthening; modalities as indicated; assess response to DN    PT Home Exercise Plan  [email or web address]    Consulted and Agree with Plan of Care  Patient       Patient will benefit from skilled therapeutic intervention in order to improve the following deficits and impairments:  Pain, Increased fascial restricitons, Increased muscle spasms, Decreased mobility, Decreased range of motion, Decreased activity tolerance  Visit Diagnosis: Acute bilateral low back pain, unspecified whether sciatica present  Pain of left lower extremity  Other symptoms and signs involving the musculoskeletal system     Problem List There are no active problems to display for this patient.     Laureen Abrahams, PT, DPT 08/13/19 12:34 PM      [facility] [place], [place], [address] Phone: [phone]   Fax:  [phone]  Name: Mary Freeman MRN: [id number] Date of Birth: 06-Jun-1951

## 2019-08-13 NOTE — Patient Instructions (Signed)

## 2019-08-14 ENCOUNTER — Encounter: Payer: Self-pay | Admitting: Physical Therapy

## 2019-08-14 ENCOUNTER — Ambulatory Visit (INDEPENDENT_AMBULATORY_CARE_PROVIDER_SITE_OTHER): Payer: PRIVATE HEALTH INSURANCE | Admitting: Physical Therapy

## 2019-08-14 DIAGNOSIS — R29898 Other symptoms and signs involving the musculoskeletal system: Secondary | ICD-10-CM | POA: Diagnosis not present

## 2019-08-14 DIAGNOSIS — R531 Weakness: Secondary | ICD-10-CM | POA: Diagnosis not present

## 2019-08-14 DIAGNOSIS — M545 Low back pain, unspecified: Secondary | ICD-10-CM

## 2019-08-14 DIAGNOSIS — M79605 Pain in left leg: Secondary | ICD-10-CM

## 2019-08-14 NOTE — Therapy (Addendum)
[place], [place], [address] Phone: [phone]   Fax:  [phone]  Physical Therapy Treatment  Patient Details  Name: Mary Freeman MRN: [id number] Date of Birth: 05-Aug-1951 Referring Provider (PT): [place], [place]   Encounter Date: 08/14/2019  PT End of Session - 08/14/19 1428    Visit Number  3    Number of Visits  12    Date for PT Re-Evaluation  09/20/19    PT Start Time  9201    PT Stop Time  1437    PT Time Calculation (min)  48 min    Activity Tolerance  Patient tolerated treatment well    Behavior During Therapy  [facility] for tasks assessed/performed       Past Medical History:  Diagnosis Date  . Depression   . Headache    migraines  . PONV (postoperative nausea and vomiting)     Past Surgical History:  Procedure Laterality Date  . APPENDECTOMY    . BREAST EXCISIONAL BIOPSY Right    benign  . COLONOSCOPY    . KNEE ARTHROSCOPY Right    x3  . KNEE ARTHROSCOPY W/ MENISCAL REPAIR Left   . LUMBAR LAMINECTOMY  1986  . REDUCTION MAMMAPLASTY Bilateral    2006  . ROTATOR CUFF REPAIR Right 2012  . TONSILLECTOMY    . TUBAL LIGATION      There were no vitals filed for this visit.  Subjective Assessment - 08/14/19 1351    Subjective  having pain today in Lt groin and into leg.  took medication and pt no longer in thigh.    Pertinent History  L shoulder scope; cervicalgia; back surgery disc sx L4/S1 1985    Diagnostic tests  xrays    Patient Stated Goals  get rid of the pain and return to normal functional and work activities    Currently in Pain?  Yes    Pain Score  5     Pain Location  Groin    Pain Orientation  Left    Pain Descriptors / Indicators  Aching    Pain Type  Acute pain    Pain Onset  1 to 4 weeks ago    Pain Frequency  Constant    Aggravating Factors   walking    Pain Relieving Factors  heat, ice                       [facility] Adult PT  Treatment/Exercise - 08/14/19 1352      Exercises   Exercises  Lumbar      Lumbar Exercises: Stretches   Hip Flexor Stretch  Left;3 reps;30 seconds   supine off mat table   Quad Stretch  Left;30 seconds;3 reps    Quad Stretch Limitations  prone with strap on 1/2 foam roll      Lumbar Exercises: Aerobic   Nustep  L5 x 6 min      Lumbar Exercises: Supine   Bridge  15 reps;5 seconds      Lumbar Exercises: Prone   Straight Leg Raise  15 reps;3 seconds   bil   Opposite Arm/Leg Raise  Right arm/Left leg;Left arm/Right leg;15 reps;3 seconds      Modalities   Modalities  Cryotherapy      Cryotherapy   Number Minutes Cryotherapy  10 Minutes    Cryotherapy Location  Hip;Lumbar Spine    Type of Cryotherapy  Ice pack  Manual Therapy   Manual Therapy  Joint mobilization    Joint Mobilization  Lt hip A/P grades 2-3 mobs and gentle LAD                  PT Long Term Goals - 08/09/19 1240      PT LONG TERM GOAL #1   Title  Increase AROM lumbar spine to WFL's and pain free    Time  6    Period  Weeks    Status  New    Target Date  09/20/19      PT LONG TERM GOAL #2   Title  Decrease pain LB and Lt LE to 0/10 to 2/10 at most with all functional and work activities    Time  6    Period  Weeks    Status  New    Target Date  09/20/19      PT LONG TERM GOAL #3   Title  Improve core strength and stability with patient to demonstrate proper lifting techniques to prepare for return to work    Time  6    Period  Weeks    Status  New    Target Date  09/20/19      PT LONG TERM GOAL #4   Title  Independent in HEP    Time  6    Period  Weeks    Status  New    Target Date  09/20/19      PT LONG TERM GOAL #5   Title  Improve FOTO to </= 41% limitation    Time  6    Period  Weeks    Status  New    Target Date  09/20/19            Plan - 08/14/19 1429    Clinical Impression Statement  Pt with ant hip pain today which relieved with exercise and manual  therapy today.  Progressing well with PT.    Stability/Clinical Decision Making  Stable/Uncomplicated    Rehab Potential  Good    PT Frequency  2x / week    PT Duration  6 weeks    PT Treatment/Interventions  Patient/family education;ADLs/Self Care Home Management;Cryotherapy;Electrical Stimulation;Iontophoresis '4mg'$ /ml Dexamethasone;Moist Heat;Ultrasound;Manual techniques;Dry needling;Neuromuscular re-education;Functional mobility training;Therapeutic activities;Therapeutic exercise    PT Next Visit Plan  review HEP;; trial of DN/manual work Lt lumbar to hip musculature; core stabilization; stretching; strengthening; modalities as indicated; assess response to DN    PT Home Exercise Plan  [email or web address]    Consulted and Agree with Plan of Care  Patient       Patient will benefit from skilled therapeutic intervention in order to improve the following deficits and impairments:  Pain, Increased fascial restricitons, Increased muscle spasms, Decreased mobility, Decreased range of motion, Decreased activity tolerance  Visit Diagnosis: Acute bilateral low back pain, unspecified whether sciatica present  Pain of left lower extremity  Other symptoms and signs involving the musculoskeletal system  Weakness generalized     Problem List There are no active problems to display for this patient.     Laureen Abrahams, PT, DPT 08/14/19 2:30 PM      [place], [place], [address] Phone: [phone]   Fax:  [phone]  Name: Mary Freeman MRN: [id number] Date of Birth: July 11, 1951  PHYSICAL THERAPY DISCHARGE SUMMARY  Visits from Start of Care: 3  Current functional level related to goals / functional outcomes:  See progress note for discharge status    Remaining deficits: Unknown    Education / Equipment: HEP  Plan: Patient agrees to discharge.  Patient goals were not met. Patient is being  discharged due to not returning since the last visit.  ?????    Celyn P. Helene Kelp PT, MPH 08/19/19 10:41 AM

## 2019-08-15 ENCOUNTER — Encounter: Payer: 59 | Admitting: Rehabilitative and Restorative Service Providers"

## 2019-08-19 ENCOUNTER — Encounter: Payer: 59 | Admitting: Rehabilitative and Restorative Service Providers"

## 2019-08-21 DIAGNOSIS — M9902 Segmental and somatic dysfunction of thoracic region: Secondary | ICD-10-CM | POA: Diagnosis not present

## 2019-08-21 DIAGNOSIS — M9903 Segmental and somatic dysfunction of lumbar region: Secondary | ICD-10-CM | POA: Diagnosis not present

## 2019-08-21 DIAGNOSIS — M531 Cervicobrachial syndrome: Secondary | ICD-10-CM | POA: Diagnosis not present

## 2019-08-21 DIAGNOSIS — M6080 Other myositis, unspecified site: Secondary | ICD-10-CM | POA: Diagnosis not present

## 2019-08-21 DIAGNOSIS — M9904 Segmental and somatic dysfunction of sacral region: Secondary | ICD-10-CM | POA: Diagnosis not present

## 2019-08-21 DIAGNOSIS — M9901 Segmental and somatic dysfunction of cervical region: Secondary | ICD-10-CM | POA: Diagnosis not present

## 2019-08-22 ENCOUNTER — Encounter: Payer: 59 | Admitting: Physical Therapy

## 2019-08-23 DIAGNOSIS — H40013 Open angle with borderline findings, low risk, bilateral: Secondary | ICD-10-CM | POA: Diagnosis not present

## 2019-08-23 DIAGNOSIS — H524 Presbyopia: Secondary | ICD-10-CM | POA: Diagnosis not present

## 2019-08-26 MED FILL — FLUoxetine HCL 10 MG CAPS: 10 | 90 days supply | Qty: 90 | Fill #0

## 2019-10-28 ENCOUNTER — Emergency Department (INDEPENDENT_AMBULATORY_CARE_PROVIDER_SITE_OTHER)
Admission: EM | Admit: 2019-10-28 | Discharge: 2019-10-28 | Disposition: A | Discharge status: Home / Self Care | Payer: Medicare Other | Source: Home / Self Care | Attending: Family Medicine | Admitting: Family Medicine

## 2019-10-28 ENCOUNTER — Other Ambulatory Visit: Payer: Self-pay

## 2019-10-28 DIAGNOSIS — S81851A Open bite, right lower leg, initial encounter: Secondary | ICD-10-CM

## 2019-10-28 DIAGNOSIS — W5501XA Bitten by cat, initial encounter: Secondary | ICD-10-CM | POA: Diagnosis not present

## 2019-10-28 DIAGNOSIS — S81852A Open bite, left lower leg, initial encounter: Secondary | ICD-10-CM

## 2019-10-28 MED ORDER — AMOXICILLIN-POT CLAVULANATE 875-125 MG PO TABS: 1.0000 | ORAL_TABLET | Freq: Two times a day (BID) | ORAL | 0 refills | Status: DC

## 2019-10-28 NOTE — ED Provider Notes (Signed)
[name]    CSN: [id number] Arrival date & time: 10/28/19  1656      History   Chief Complaint Chief Complaint  Patient presents with  . Animal Bite    HPI Mary Freeman is a 68 y.o. female.   Patient's cat was startled today and bit patient on her lower legs.  Patient had tetanus immunization last year and cat is current on rabies vaccination.  The history is provided by the patient.  Animal Bite Contact animal:  Cat Location:  Leg Leg injury location:  R lower leg and L lower leg Time since incident:  2 hours Pain details:    Quality:  Aching   Severity:  Mild   Progression:  Improving Incident location:  Home Provoked: provoked   Notifications:  None Animal's rabies vaccination status:  Up to date Animal in possession: yes   Tetanus status:  Up to date Relieved by:  Nothing Worsened by:  Nothing Ineffective treatments:  None tried Associated symptoms: no swelling     Past Medical History:  Diagnosis Date  . Depression   . Headache    migraines  . PONV (postoperative nausea and vomiting)     There are no active problems to display for this patient.   Past Surgical History:  Procedure Laterality Date  . APPENDECTOMY    . BREAST EXCISIONAL BIOPSY Right    benign  . COLONOSCOPY    . KNEE ARTHROSCOPY Right    x3  . KNEE ARTHROSCOPY W/ MENISCAL REPAIR Left   . LUMBAR LAMINECTOMY  1986  . REDUCTION MAMMAPLASTY Bilateral    2006  . ROTATOR CUFF REPAIR Right 2012  . TONSILLECTOMY    . TUBAL LIGATION      OB History   No obstetric history on file.      Home Medications    Prior to Admission medications   Medication Sig Start Date End Date Taking? Authorizing Provider  amoxicillin-clavulanate (AUGMENTIN) 875-125 MG tablet Take 1 tablet by mouth every 12 (twelve) hours. Take with food. 10/28/19   Kandra Nicolas, MD  Calcium-Magnesium (CAL-MAG PO) Take 1 tablet by mouth daily.    [provider]  cholecalciferol  (VITAMIN D) 1000 units tablet Take 1,000 Units by mouth daily.    [provider]  FLUoxetine (PROZAC) 20 MG tablet Take 20 mg by mouth daily.    [provider]  LORazepam (ATIVAN) 0.5 MG tablet Take 0.5 mg by mouth at bedtime as needed for sleep.    [provider]  Multiple Vitamins-Minerals (MULTIVITAMIN WITH MINERALS) tablet Take 1 tablet by mouth daily.    [provider]  mupirocin ointment (BACTROBAN) 2 % Apply 1 application topically 3 (three) times daily. 03/06/19   Kandra Nicolas, MD  rizatriptan (MAXALT) 10 MG tablet Take 10 mg by mouth as needed for migraine. May repeat in 2 hours if needed    [provider]  vitamin E 100 UNIT capsule Take 100 Units by mouth daily.    [provider]    Family History Family History  Problem Relation Age of Onset  . Cancer Father        lung  . Cancer Sister        breast  . Breast cancer Sister 29  . Alcoholism Mother   . Alcoholism Brother   . Cancer Sister   . Breast cancer Sister 34    Social History Social History   Tobacco Use  .  Smoking status: Never Smoker  . Smokeless tobacco: Never Used  Substance Use Topics  . Alcohol use: Yes    Comment: very rare   . Drug use: No     Allergies   Amitriptyline, Dilaudid [hydromorphone], Diphenhydramine, Stadol [butorphanol], Talwin [pentazocine], Benadryl [diphenhydramine hcl], Flagyl [metronidazole], Solu-medrol [methylprednisolone], Butorphanol tartrate, Hydrocodone-acetaminophen, Hydromorphone hcl, Morphine and related, Other, Oxycodone-acetaminophen, Phenergan [promethazine], and Sulfa antibiotics   Review of Systems Review of Systems  All other systems reviewed and are negative.    Physical Exam Triage Vital Signs ED Triage Vitals  Enc Vitals Group     BP 10/28/19 1913 100/64     Pulse Rate 10/28/19 1913 73     Resp 10/28/19 1913 20     Temp 10/28/19 1913 (!) 97.4 F (36.3 C)     Temp Source 10/28/19 1913 Oral      SpO2 10/28/19 1913 98 %     Weight 10/28/19 1914 147 lb (66.7 kg)     Height 10/28/19 1914 5\' 7"  (1.702 m)     Head Circumference --      Peak Flow --      Pain Score 10/28/19 1914 0     Pain Loc --      Pain Edu? --      Excl. in [place]? --    No data found.  Updated Vital Signs BP 100/64 (BP Location: Right Arm)   Pulse 73   Temp (!) 97.4 F (36.3 C) (Oral)   Resp 20   Ht 5\' 7"  (1.702 m)   Wt 66.7 kg   SpO2 98%   BMI 23.02 kg/m   Visual Acuity Right Eye Distance:   Left Eye Distance:   Bilateral Distance:    Right Eye Near:   Left Eye Near:    Bilateral Near:     Physical Exam Vitals signs and nursing note reviewed.  Constitutional:      General: She is not in acute distress. HENT:     Head: Atraumatic.  Eyes:     Pupils: Pupils are equal, round, and reactive to light.  Cardiovascular:     Rate and Rhythm: Normal rate.  Pulmonary:     Effort: Pulmonary effort is normal.  Musculoskeletal:     Left lower leg: She exhibits no tenderness and no swelling. No edema.       Legs:     Comments: Patient's left posterior calf has four minimal superficial puncture wounds as noted on diagram.  Patient's right lower leg has a small 49mm abrasion  as noted on diagram.    Skin:    General: Skin is warm and dry.  Neurological:     Mental Status: She is alert.      UC Treatments / Results  Labs (all labs ordered are listed, but only abnormal results are displayed) Labs Reviewed - No data to display  EKG   Radiology No results found.  Procedures Procedures (including critical care time)  Medications Ordered in UC Medications - No data to display  Initial Impression / Assessment and Plan / UC Course  I have reviewed the triage vital signs and the nursing notes.  Pertinent labs & imaging results that were available during my care of the patient were reviewed by me and considered in my medical decision making (see chart for details).    Begin Augmentin for  5 days. Followup with Family Doctor if not improved in one week.    Final Clinical Impressions(s) / UC Diagnoses  Final diagnoses:  Cat bite of right lower leg, initial encounter  Cat bite of left lower leg, initial encounter     Discharge Instructions     Recommend taking a probiotic while taking Augmentin.    ED Prescriptions    Medication Sig Dispense Auth. Provider   amoxicillin-clavulanate (AUGMENTIN) 875-125 MG tablet Take 1 tablet by mouth every 12 (twelve) hours. Take with food. 10 tablet Kandra Nicolas, MD        Kandra Nicolas, MD 10/31/19 [phone]

## 2019-10-28 NOTE — ED Triage Notes (Signed)
Pt was bitten by her cat today on both ankles. Cat is up to date on rabies.

## 2019-10-28 NOTE — Discharge Instructions (Addendum)
Recommend taking a probiotic while taking Augmentin.

## 2019-12-02 DIAGNOSIS — M9903 Segmental and somatic dysfunction of lumbar region: Secondary | ICD-10-CM | POA: Diagnosis not present

## 2019-12-02 DIAGNOSIS — Z85828 Personal history of other malignant neoplasm of skin: Secondary | ICD-10-CM | POA: Diagnosis not present

## 2019-12-02 DIAGNOSIS — M5032 Other cervical disc degeneration, mid-cervical region, unspecified level: Secondary | ICD-10-CM | POA: Diagnosis not present

## 2019-12-02 DIAGNOSIS — L438 Other lichen planus: Secondary | ICD-10-CM | POA: Diagnosis not present

## 2019-12-02 DIAGNOSIS — M5137 Other intervertebral disc degeneration, lumbosacral region: Secondary | ICD-10-CM | POA: Diagnosis not present

## 2019-12-02 DIAGNOSIS — L820 Inflamed seborrheic keratosis: Secondary | ICD-10-CM | POA: Diagnosis not present

## 2019-12-02 DIAGNOSIS — L814 Other melanin hyperpigmentation: Secondary | ICD-10-CM | POA: Diagnosis not present

## 2019-12-02 DIAGNOSIS — M5136 Other intervertebral disc degeneration, lumbar region: Secondary | ICD-10-CM | POA: Diagnosis not present

## 2019-12-02 DIAGNOSIS — M5134 Other intervertebral disc degeneration, thoracic region: Secondary | ICD-10-CM | POA: Diagnosis not present

## 2019-12-02 DIAGNOSIS — H61002 Unspecified perichondritis of left external ear: Secondary | ICD-10-CM | POA: Diagnosis not present

## 2019-12-02 DIAGNOSIS — M9904 Segmental and somatic dysfunction of sacral region: Secondary | ICD-10-CM | POA: Diagnosis not present

## 2019-12-02 DIAGNOSIS — M9902 Segmental and somatic dysfunction of thoracic region: Secondary | ICD-10-CM | POA: Diagnosis not present

## 2019-12-02 DIAGNOSIS — L570 Actinic keratosis: Secondary | ICD-10-CM | POA: Diagnosis not present

## 2019-12-02 DIAGNOSIS — M9901 Segmental and somatic dysfunction of cervical region: Secondary | ICD-10-CM | POA: Diagnosis not present

## 2019-12-02 DIAGNOSIS — L821 Other seborrheic keratosis: Secondary | ICD-10-CM | POA: Diagnosis not present

## 2019-12-05 ENCOUNTER — Other Ambulatory Visit: Payer: Self-pay | Admitting: Family Medicine

## 2019-12-05 DIAGNOSIS — Z1231 Encounter for screening mammogram for malignant neoplasm of breast: Secondary | ICD-10-CM

## 2019-12-13 DIAGNOSIS — M5032 Other cervical disc degeneration, mid-cervical region, unspecified level: Secondary | ICD-10-CM | POA: Diagnosis not present

## 2019-12-13 DIAGNOSIS — M9904 Segmental and somatic dysfunction of sacral region: Secondary | ICD-10-CM | POA: Diagnosis not present

## 2019-12-13 DIAGNOSIS — M9901 Segmental and somatic dysfunction of cervical region: Secondary | ICD-10-CM | POA: Diagnosis not present

## 2019-12-13 DIAGNOSIS — M5137 Other intervertebral disc degeneration, lumbosacral region: Secondary | ICD-10-CM | POA: Diagnosis not present

## 2019-12-13 DIAGNOSIS — M5136 Other intervertebral disc degeneration, lumbar region: Secondary | ICD-10-CM | POA: Diagnosis not present

## 2019-12-13 DIAGNOSIS — M5134 Other intervertebral disc degeneration, thoracic region: Secondary | ICD-10-CM | POA: Diagnosis not present

## 2019-12-13 DIAGNOSIS — M9903 Segmental and somatic dysfunction of lumbar region: Secondary | ICD-10-CM | POA: Diagnosis not present

## 2019-12-13 DIAGNOSIS — M9902 Segmental and somatic dysfunction of thoracic region: Secondary | ICD-10-CM | POA: Diagnosis not present

## 2019-12-31 DIAGNOSIS — M5032 Other cervical disc degeneration, mid-cervical region, unspecified level: Secondary | ICD-10-CM | POA: Diagnosis not present

## 2019-12-31 DIAGNOSIS — M5137 Other intervertebral disc degeneration, lumbosacral region: Secondary | ICD-10-CM | POA: Diagnosis not present

## 2019-12-31 DIAGNOSIS — M9902 Segmental and somatic dysfunction of thoracic region: Secondary | ICD-10-CM | POA: Diagnosis not present

## 2019-12-31 DIAGNOSIS — M5136 Other intervertebral disc degeneration, lumbar region: Secondary | ICD-10-CM | POA: Diagnosis not present

## 2019-12-31 DIAGNOSIS — M5134 Other intervertebral disc degeneration, thoracic region: Secondary | ICD-10-CM | POA: Diagnosis not present

## 2019-12-31 DIAGNOSIS — M9904 Segmental and somatic dysfunction of sacral region: Secondary | ICD-10-CM | POA: Diagnosis not present

## 2019-12-31 DIAGNOSIS — M9903 Segmental and somatic dysfunction of lumbar region: Secondary | ICD-10-CM | POA: Diagnosis not present

## 2019-12-31 DIAGNOSIS — M9901 Segmental and somatic dysfunction of cervical region: Secondary | ICD-10-CM | POA: Diagnosis not present

## 2020-01-15 ENCOUNTER — Ambulatory Visit
Admission: RE | Admit: 2020-01-15 | Discharge: 2020-01-15 | Disposition: A | Discharge status: Home / Self Care | Payer: Medicare HMO | Source: Ambulatory Visit | Attending: Family Medicine | Admitting: Family Medicine

## 2020-01-15 ENCOUNTER — Other Ambulatory Visit: Payer: Self-pay

## 2020-01-15 DIAGNOSIS — Z1231 Encounter for screening mammogram for malignant neoplasm of breast: Secondary | ICD-10-CM | POA: Diagnosis not present

## 2020-01-15 DIAGNOSIS — M5134 Other intervertebral disc degeneration, thoracic region: Secondary | ICD-10-CM | POA: Diagnosis not present

## 2020-01-15 DIAGNOSIS — M5032 Other cervical disc degeneration, mid-cervical region, unspecified level: Secondary | ICD-10-CM | POA: Diagnosis not present

## 2020-01-15 DIAGNOSIS — M9902 Segmental and somatic dysfunction of thoracic region: Secondary | ICD-10-CM | POA: Diagnosis not present

## 2020-01-15 DIAGNOSIS — M5137 Other intervertebral disc degeneration, lumbosacral region: Secondary | ICD-10-CM | POA: Diagnosis not present

## 2020-01-15 DIAGNOSIS — M9903 Segmental and somatic dysfunction of lumbar region: Secondary | ICD-10-CM | POA: Diagnosis not present

## 2020-01-15 DIAGNOSIS — M5136 Other intervertebral disc degeneration, lumbar region: Secondary | ICD-10-CM | POA: Diagnosis not present

## 2020-01-15 DIAGNOSIS — M9901 Segmental and somatic dysfunction of cervical region: Secondary | ICD-10-CM | POA: Diagnosis not present

## 2020-01-15 DIAGNOSIS — M9904 Segmental and somatic dysfunction of sacral region: Secondary | ICD-10-CM | POA: Diagnosis not present

## 2020-01-31 DIAGNOSIS — M542 Cervicalgia: Secondary | ICD-10-CM | POA: Diagnosis not present

## 2020-01-31 DIAGNOSIS — M859 Disorder of bone density and structure, unspecified: Secondary | ICD-10-CM | POA: Diagnosis not present

## 2020-01-31 DIAGNOSIS — Z6823 Body mass index (BMI) 23.0-23.9, adult: Secondary | ICD-10-CM | POA: Diagnosis not present

## 2020-01-31 DIAGNOSIS — R69 Illness, unspecified: Secondary | ICD-10-CM | POA: Diagnosis not present

## 2020-01-31 DIAGNOSIS — Z1159 Encounter for screening for other viral diseases: Secondary | ICD-10-CM | POA: Diagnosis not present

## 2020-01-31 DIAGNOSIS — G43009 Migraine without aura, not intractable, without status migrainosus: Secondary | ICD-10-CM | POA: Diagnosis not present

## 2020-01-31 DIAGNOSIS — R05 Cough: Secondary | ICD-10-CM | POA: Diagnosis not present

## 2020-01-31 DIAGNOSIS — R5383 Other fatigue: Secondary | ICD-10-CM | POA: Diagnosis not present

## 2020-01-31 DIAGNOSIS — G479 Sleep disorder, unspecified: Secondary | ICD-10-CM | POA: Diagnosis not present

## 2020-01-31 DIAGNOSIS — Z0000 Encounter for general adult medical examination without abnormal findings: Secondary | ICD-10-CM | POA: Diagnosis not present

## 2020-02-14 DIAGNOSIS — S60351A Superficial foreign body of right thumb, initial encounter: Secondary | ICD-10-CM | POA: Diagnosis not present

## 2020-02-14 DIAGNOSIS — R05 Cough: Secondary | ICD-10-CM | POA: Diagnosis not present

## 2020-02-28 DIAGNOSIS — M9902 Segmental and somatic dysfunction of thoracic region: Secondary | ICD-10-CM | POA: Diagnosis not present

## 2020-02-28 DIAGNOSIS — M9904 Segmental and somatic dysfunction of sacral region: Secondary | ICD-10-CM | POA: Diagnosis not present

## 2020-02-28 DIAGNOSIS — M9903 Segmental and somatic dysfunction of lumbar region: Secondary | ICD-10-CM | POA: Diagnosis not present

## 2020-02-28 DIAGNOSIS — M5136 Other intervertebral disc degeneration, lumbar region: Secondary | ICD-10-CM | POA: Diagnosis not present

## 2020-02-28 DIAGNOSIS — M5134 Other intervertebral disc degeneration, thoracic region: Secondary | ICD-10-CM | POA: Diagnosis not present

## 2020-02-28 DIAGNOSIS — M9901 Segmental and somatic dysfunction of cervical region: Secondary | ICD-10-CM | POA: Diagnosis not present

## 2020-02-28 DIAGNOSIS — M5032 Other cervical disc degeneration, mid-cervical region, unspecified level: Secondary | ICD-10-CM | POA: Diagnosis not present

## 2020-02-28 DIAGNOSIS — M5137 Other intervertebral disc degeneration, lumbosacral region: Secondary | ICD-10-CM | POA: Diagnosis not present

## 2020-03-10 ENCOUNTER — Encounter: Payer: Self-pay | Admitting: Emergency Medicine

## 2020-03-10 ENCOUNTER — Other Ambulatory Visit: Payer: Self-pay

## 2020-03-10 ENCOUNTER — Emergency Department
Admission: EM | Admit: 2020-03-10 | Discharge: 2020-03-10 | Disposition: A | Discharge status: Home / Self Care | Payer: Medicare HMO | Source: Home / Self Care

## 2020-03-10 DIAGNOSIS — R52 Pain, unspecified: Secondary | ICD-10-CM

## 2020-03-10 DIAGNOSIS — T50Z95A Adverse effect of other vaccines and biological substances, initial encounter: Secondary | ICD-10-CM

## 2020-03-10 MED ORDER — KETOROLAC TROMETHAMINE 60 MG/2ML IM SOLN
60.0000 mg | Freq: Once | INTRAMUSCULAR | Status: AC
Start: 2020-03-10 — End: 2020-03-10
  Administered 2020-03-10: 60 mg via INTRAMUSCULAR

## 2020-03-10 NOTE — ED Provider Notes (Signed)
[name]    CSN: [id number] Arrival date & time: 03/10/20  0802      History   Chief Complaint Chief Complaint  Patient presents with  . Generalized Body Aches    HPI Mary Freeman is a 69 y.o. female.   HPI  Mary Freeman is a 69 y.o. female presenting to UC with c/o diffuse body aches with muscle and joint pain after getting her 2nd Covid-19 vaccine on Friday, 4/9.  She has taken tylenol and potassium w/o much relief.  The day after her vaccine she did have chills, and a headache yesterday, both have resolved.  Denies sick contacts or recent travel. She had no reaction with the 1st vaccine.    Past Medical History:  Diagnosis Date  . Depression   . Headache    migraines  . PONV (postoperative nausea and vomiting)     There are no problems to display for this patient.   Past Surgical History:  Procedure Laterality Date  . APPENDECTOMY    . BREAST EXCISIONAL BIOPSY Right    benign  . COLONOSCOPY    . KNEE ARTHROSCOPY Right    x3  . KNEE ARTHROSCOPY W/ MENISCAL REPAIR Left   . LUMBAR LAMINECTOMY  1986  . REDUCTION MAMMAPLASTY Bilateral    2006  . ROTATOR CUFF REPAIR Right 2012  . TONSILLECTOMY    . TUBAL LIGATION      OB History   No obstetric history on file.      Home Medications    Prior to Admission medications   Medication Sig Start Date End Date Taking? Authorizing Provider  Calcium-Magnesium (CAL-MAG PO) Take 1 tablet by mouth daily.    [provider]  cholecalciferol (VITAMIN D) 1000 units tablet Take 1,000 Units by mouth daily.    [provider]  LORazepam (ATIVAN) 0.5 MG tablet Take 0.5 mg by mouth at bedtime as needed for sleep.    [provider]  Multiple Vitamins-Minerals (MULTIVITAMIN WITH MINERALS) tablet Take 1 tablet by mouth daily.    [provider]  rizatriptan (MAXALT) 10 MG tablet Take 10 mg by mouth as needed for migraine. May repeat in 2 hours if needed    [provider]  vitamin E 100 UNIT capsule Take 100 Units by mouth daily.    [provider]    Family History Family History  Problem Relation Age of Onset  . Cancer Father        lung  . Cancer Sister        breast  . Breast cancer Sister 53  . Alcoholism Mother   . Alcoholism Brother   . Cancer Sister   . Breast cancer Sister 52    Social History Social History   Tobacco Use  . Smoking status: Never Smoker  . Smokeless tobacco: Never Used  Substance Use Topics  . Alcohol use: Yes    Comment: very rare   . Drug use: No     Allergies   Amitriptyline, Dilaudid [hydromorphone], Diphenhydramine, Stadol [butorphanol], Talwin [pentazocine], Benadryl [diphenhydramine hcl], Flagyl [metronidazole], Solu-medrol [methylprednisolone], Butorphanol tartrate, Hydrocodone-acetaminophen, Hydromorphone hcl, Morphine and related, Other, Oxycodone-acetaminophen, Phenergan [promethazine], and Sulfa antibiotics   Review of Systems Review of Systems  Constitutional: Negative for chills and fever.  HENT: Positive for congestion (minimal). Negative for ear pain, sore throat, trouble swallowing and voice change.   Respiratory: Positive for cough (mild from post-nasal drip). Negative for shortness of breath.   Cardiovascular:  Negative for chest pain and palpitations.  Gastrointestinal: Negative for abdominal pain, diarrhea, nausea and vomiting.  Musculoskeletal: Positive for arthralgias, back pain and myalgias.  Skin: Negative for rash.  All other systems reviewed and are negative.    Physical Exam Triage Vital Signs ED Triage Vitals  Enc Vitals Group     BP 03/10/20 0820 106/69     Pulse Rate 03/10/20 0820 79     Resp --      Temp 03/10/20 0820 (!) 97.4 F (36.3 C)     Temp Source 03/10/20 0820 Oral     SpO2 03/10/20 0820 100 %     Weight 03/10/20 0821 140 lb (63.5 kg)     Height 03/10/20 0821 5\' 7"  (1.702 m)     Head Circumference --      Peak Flow --      Pain Score  03/10/20 0820 8     Pain Loc --      Pain Edu? --      Excl. in [place]? --    No data found.  Updated Vital Signs BP 106/69 (BP Location: Right Arm)   Pulse 79   Temp (!) 97.4 F (36.3 C) (Oral)   Ht 5\' 7"  (1.702 m)   Wt 140 lb (63.5 kg)   SpO2 100%   BMI 21.93 kg/m   Visual Acuity Right Eye Distance:   Left Eye Distance:   Bilateral Distance:    Right Eye Near:   Left Eye Near:    Bilateral Near:     Physical Exam Vitals and nursing note reviewed.  Constitutional:      Appearance: Normal appearance. She is well-developed.     Comments: Lying on exam bed, appears mildly fatigued, does not appear to feel well but is alert and cooperative during exam.  HENT:     Head: Normocephalic and atraumatic.     Right Ear: Tympanic membrane and ear canal normal.     Left Ear: Tympanic membrane and ear canal normal.     Nose: Nose normal.     Right Sinus: No maxillary sinus tenderness or frontal sinus tenderness.     Left Sinus: No maxillary sinus tenderness or frontal sinus tenderness.     Mouth/Throat:     Lips: Pink.     Mouth: Mucous membranes are moist.     Pharynx: Oropharynx is clear. Uvula midline.  Cardiovascular:     Rate and Rhythm: Normal rate and regular rhythm.  Pulmonary:     Effort: Pulmonary effort is normal. No respiratory distress.     Breath sounds: Normal breath sounds. No stridor. No wheezing, rhonchi or rales.  Musculoskeletal:        General: Normal range of motion.     Cervical back: Normal range of motion.  Skin:    General: Skin is warm and dry.  Neurological:     Mental Status: She is alert and oriented to person, place, and time.  Psychiatric:        Behavior: Behavior normal.      UC Treatments / Results  Labs (all labs ordered are listed, but only abnormal results are displayed) Labs Reviewed - No data to display  EKG   Radiology No results found.  Procedures Procedures (including critical care time)  Medications Ordered in  UC Medications  ketorolac (TORADOL) injection 60 mg (has no administration in time range)    Initial Impression / Assessment and Plan / UC Course  I have reviewed the  triage vital signs and the nursing notes.  Pertinent labs & imaging results that were available during my care of the patient were reviewed by me and considered in my medical decision making (see chart for details).     Hx and exam c/w immune response to recent Covid-19 vaccine  Toradol 60mg  IM given in UC, pt notes she has done well with this in the past. Encouraged symptomatic care AVS provided  Final Clinical Impressions(s) / UC Diagnoses   Final diagnoses:  Generalized body aches  Vaccine reaction, initial encounter     Discharge Instructions      You may take 500mg  acetaminophen every 4-6 hours or in combination with ibuprofen 400-600mg  every 6-8 hours as needed for pain, inflammation, and fever.  Be sure to well hydrated with clear liquids and get at least 8 hours of sleep at night, preferably more while sick.   Please follow up with family medicine later this week if not improving, sooner if new symptoms develop.    ED Prescriptions    None     PDMP not reviewed this encounter.   Noe Gens, [place] 03/10/20 [phone]

## 2020-03-10 NOTE — ED Triage Notes (Signed)
Side effects from 2nd vaccination on Friday, Body aches, has  Taking Tylenol, potassium.

## 2020-03-10 NOTE — Discharge Instructions (Signed)
  You may take 500mg  acetaminophen every 4-6 hours or in combination with ibuprofen 400-600mg  every 6-8 hours as needed for pain, inflammation, and fever.  Be sure to well hydrated with clear liquids and get at least 8 hours of sleep at night, preferably more while sick.   Please follow up with family medicine later this week if not improving, sooner if new symptoms develop.

## 2020-03-19 DIAGNOSIS — L600 Ingrowing nail: Secondary | ICD-10-CM | POA: Diagnosis not present

## 2020-03-19 DIAGNOSIS — M79674 Pain in right toe(s): Secondary | ICD-10-CM | POA: Diagnosis not present

## 2020-04-03 DIAGNOSIS — L03031 Cellulitis of right toe: Secondary | ICD-10-CM | POA: Diagnosis not present

## 2020-04-03 DIAGNOSIS — M79674 Pain in right toe(s): Secondary | ICD-10-CM | POA: Diagnosis not present

## 2020-04-03 DIAGNOSIS — L600 Ingrowing nail: Secondary | ICD-10-CM | POA: Diagnosis not present

## 2020-04-10 DIAGNOSIS — M9902 Segmental and somatic dysfunction of thoracic region: Secondary | ICD-10-CM | POA: Diagnosis not present

## 2020-04-10 DIAGNOSIS — M9904 Segmental and somatic dysfunction of sacral region: Secondary | ICD-10-CM | POA: Diagnosis not present

## 2020-04-10 DIAGNOSIS — M9901 Segmental and somatic dysfunction of cervical region: Secondary | ICD-10-CM | POA: Diagnosis not present

## 2020-04-10 DIAGNOSIS — M5032 Other cervical disc degeneration, mid-cervical region, unspecified level: Secondary | ICD-10-CM | POA: Diagnosis not present

## 2020-04-10 DIAGNOSIS — M5137 Other intervertebral disc degeneration, lumbosacral region: Secondary | ICD-10-CM | POA: Diagnosis not present

## 2020-04-10 DIAGNOSIS — M5136 Other intervertebral disc degeneration, lumbar region: Secondary | ICD-10-CM | POA: Diagnosis not present

## 2020-04-10 DIAGNOSIS — M9903 Segmental and somatic dysfunction of lumbar region: Secondary | ICD-10-CM | POA: Diagnosis not present

## 2020-04-10 DIAGNOSIS — M5134 Other intervertebral disc degeneration, thoracic region: Secondary | ICD-10-CM | POA: Diagnosis not present

## 2020-04-13 DIAGNOSIS — F329 Major depressive disorder, single episode, unspecified: Secondary | ICD-10-CM | POA: Diagnosis not present

## 2020-04-13 DIAGNOSIS — G43009 Migraine without aura, not intractable, without status migrainosus: Secondary | ICD-10-CM | POA: Diagnosis not present

## 2020-04-13 DIAGNOSIS — N183 Chronic kidney disease, stage 3 unspecified: Secondary | ICD-10-CM | POA: Diagnosis not present

## 2020-04-13 DIAGNOSIS — R69 Illness, unspecified: Secondary | ICD-10-CM | POA: Diagnosis not present

## 2020-05-07 DIAGNOSIS — I83813 Varicose veins of bilateral lower extremities with pain: Secondary | ICD-10-CM | POA: Diagnosis not present

## 2020-05-11 DIAGNOSIS — I83813 Varicose veins of bilateral lower extremities with pain: Secondary | ICD-10-CM | POA: Diagnosis not present

## 2020-05-13 DIAGNOSIS — M5032 Other cervical disc degeneration, mid-cervical region, unspecified level: Secondary | ICD-10-CM | POA: Diagnosis not present

## 2020-05-13 DIAGNOSIS — M5137 Other intervertebral disc degeneration, lumbosacral region: Secondary | ICD-10-CM | POA: Diagnosis not present

## 2020-05-13 DIAGNOSIS — M9903 Segmental and somatic dysfunction of lumbar region: Secondary | ICD-10-CM | POA: Diagnosis not present

## 2020-05-13 DIAGNOSIS — M9901 Segmental and somatic dysfunction of cervical region: Secondary | ICD-10-CM | POA: Diagnosis not present

## 2020-05-13 DIAGNOSIS — M9902 Segmental and somatic dysfunction of thoracic region: Secondary | ICD-10-CM | POA: Diagnosis not present

## 2020-05-13 DIAGNOSIS — M5134 Other intervertebral disc degeneration, thoracic region: Secondary | ICD-10-CM | POA: Diagnosis not present

## 2020-05-13 DIAGNOSIS — M9904 Segmental and somatic dysfunction of sacral region: Secondary | ICD-10-CM | POA: Diagnosis not present

## 2020-05-13 DIAGNOSIS — I83813 Varicose veins of bilateral lower extremities with pain: Secondary | ICD-10-CM | POA: Diagnosis not present

## 2020-05-13 DIAGNOSIS — M5136 Other intervertebral disc degeneration, lumbar region: Secondary | ICD-10-CM | POA: Diagnosis not present

## 2020-05-19 DIAGNOSIS — Z780 Asymptomatic menopausal state: Secondary | ICD-10-CM | POA: Diagnosis not present

## 2020-05-19 DIAGNOSIS — Z01419 Encounter for gynecological examination (general) (routine) without abnormal findings: Secondary | ICD-10-CM | POA: Diagnosis not present

## 2020-05-27 DIAGNOSIS — I83812 Varicose veins of left lower extremities with pain: Secondary | ICD-10-CM | POA: Diagnosis not present

## 2020-05-27 DIAGNOSIS — I8312 Varicose veins of left lower extremity with inflammation: Secondary | ICD-10-CM | POA: Diagnosis not present

## 2020-06-02 DIAGNOSIS — Z85828 Personal history of other malignant neoplasm of skin: Secondary | ICD-10-CM | POA: Diagnosis not present

## 2020-06-02 DIAGNOSIS — L814 Other melanin hyperpigmentation: Secondary | ICD-10-CM | POA: Diagnosis not present

## 2020-06-02 DIAGNOSIS — L570 Actinic keratosis: Secondary | ICD-10-CM | POA: Diagnosis not present

## 2020-06-02 DIAGNOSIS — L565 Disseminated superficial actinic porokeratosis (DSAP): Secondary | ICD-10-CM | POA: Diagnosis not present

## 2020-06-17 DIAGNOSIS — I8311 Varicose veins of right lower extremity with inflammation: Secondary | ICD-10-CM | POA: Diagnosis not present

## 2020-07-03 DIAGNOSIS — M9904 Segmental and somatic dysfunction of sacral region: Secondary | ICD-10-CM | POA: Diagnosis not present

## 2020-07-03 DIAGNOSIS — M5136 Other intervertebral disc degeneration, lumbar region: Secondary | ICD-10-CM | POA: Diagnosis not present

## 2020-07-03 DIAGNOSIS — M5137 Other intervertebral disc degeneration, lumbosacral region: Secondary | ICD-10-CM | POA: Diagnosis not present

## 2020-07-03 DIAGNOSIS — M5134 Other intervertebral disc degeneration, thoracic region: Secondary | ICD-10-CM | POA: Diagnosis not present

## 2020-07-03 DIAGNOSIS — M9903 Segmental and somatic dysfunction of lumbar region: Secondary | ICD-10-CM | POA: Diagnosis not present

## 2020-07-03 DIAGNOSIS — M9901 Segmental and somatic dysfunction of cervical region: Secondary | ICD-10-CM | POA: Diagnosis not present

## 2020-07-03 DIAGNOSIS — M5032 Other cervical disc degeneration, mid-cervical region, unspecified level: Secondary | ICD-10-CM | POA: Diagnosis not present

## 2020-07-03 DIAGNOSIS — M9902 Segmental and somatic dysfunction of thoracic region: Secondary | ICD-10-CM | POA: Diagnosis not present

## 2020-07-08 DIAGNOSIS — R69 Illness, unspecified: Secondary | ICD-10-CM | POA: Diagnosis not present

## 2020-07-09 DIAGNOSIS — G43909 Migraine, unspecified, not intractable, without status migrainosus: Secondary | ICD-10-CM | POA: Diagnosis not present

## 2020-07-20 DIAGNOSIS — M9903 Segmental and somatic dysfunction of lumbar region: Secondary | ICD-10-CM | POA: Diagnosis not present

## 2020-07-20 DIAGNOSIS — M5137 Other intervertebral disc degeneration, lumbosacral region: Secondary | ICD-10-CM | POA: Diagnosis not present

## 2020-07-20 DIAGNOSIS — M9904 Segmental and somatic dysfunction of sacral region: Secondary | ICD-10-CM | POA: Diagnosis not present

## 2020-07-20 DIAGNOSIS — M5136 Other intervertebral disc degeneration, lumbar region: Secondary | ICD-10-CM | POA: Diagnosis not present

## 2020-07-20 DIAGNOSIS — M5134 Other intervertebral disc degeneration, thoracic region: Secondary | ICD-10-CM | POA: Diagnosis not present

## 2020-07-20 DIAGNOSIS — M9901 Segmental and somatic dysfunction of cervical region: Secondary | ICD-10-CM | POA: Diagnosis not present

## 2020-07-20 DIAGNOSIS — M9902 Segmental and somatic dysfunction of thoracic region: Secondary | ICD-10-CM | POA: Diagnosis not present

## 2020-07-20 DIAGNOSIS — M5032 Other cervical disc degeneration, mid-cervical region, unspecified level: Secondary | ICD-10-CM | POA: Diagnosis not present

## 2020-08-04 DIAGNOSIS — G43009 Migraine without aura, not intractable, without status migrainosus: Secondary | ICD-10-CM | POA: Diagnosis not present

## 2020-08-04 DIAGNOSIS — K219 Gastro-esophageal reflux disease without esophagitis: Secondary | ICD-10-CM | POA: Diagnosis not present

## 2020-08-06 ENCOUNTER — Ambulatory Visit: Payer: Self-pay

## 2020-08-12 DIAGNOSIS — M5134 Other intervertebral disc degeneration, thoracic region: Secondary | ICD-10-CM | POA: Diagnosis not present

## 2020-08-12 DIAGNOSIS — M9904 Segmental and somatic dysfunction of sacral region: Secondary | ICD-10-CM | POA: Diagnosis not present

## 2020-08-12 DIAGNOSIS — M5137 Other intervertebral disc degeneration, lumbosacral region: Secondary | ICD-10-CM | POA: Diagnosis not present

## 2020-08-12 DIAGNOSIS — M9901 Segmental and somatic dysfunction of cervical region: Secondary | ICD-10-CM | POA: Diagnosis not present

## 2020-08-12 DIAGNOSIS — M9902 Segmental and somatic dysfunction of thoracic region: Secondary | ICD-10-CM | POA: Diagnosis not present

## 2020-08-12 DIAGNOSIS — M5136 Other intervertebral disc degeneration, lumbar region: Secondary | ICD-10-CM | POA: Diagnosis not present

## 2020-08-12 DIAGNOSIS — M9903 Segmental and somatic dysfunction of lumbar region: Secondary | ICD-10-CM | POA: Diagnosis not present

## 2020-08-12 DIAGNOSIS — M5032 Other cervical disc degeneration, mid-cervical region, unspecified level: Secondary | ICD-10-CM | POA: Diagnosis not present

## 2020-08-24 DIAGNOSIS — H5213 Myopia, bilateral: Secondary | ICD-10-CM | POA: Diagnosis not present

## 2020-08-24 DIAGNOSIS — H40013 Open angle with borderline findings, low risk, bilateral: Secondary | ICD-10-CM | POA: Diagnosis not present

## 2020-09-13 DIAGNOSIS — R69 Illness, unspecified: Secondary | ICD-10-CM | POA: Diagnosis not present

## 2020-09-14 DIAGNOSIS — R69 Illness, unspecified: Secondary | ICD-10-CM | POA: Diagnosis not present

## 2020-09-21 DIAGNOSIS — I8312 Varicose veins of left lower extremity with inflammation: Secondary | ICD-10-CM | POA: Diagnosis not present

## 2020-09-21 DIAGNOSIS — I8311 Varicose veins of right lower extremity with inflammation: Secondary | ICD-10-CM | POA: Diagnosis not present

## 2020-09-22 DIAGNOSIS — M5134 Other intervertebral disc degeneration, thoracic region: Secondary | ICD-10-CM | POA: Diagnosis not present

## 2020-09-22 DIAGNOSIS — M9904 Segmental and somatic dysfunction of sacral region: Secondary | ICD-10-CM | POA: Diagnosis not present

## 2020-09-22 DIAGNOSIS — M5137 Other intervertebral disc degeneration, lumbosacral region: Secondary | ICD-10-CM | POA: Diagnosis not present

## 2020-09-22 DIAGNOSIS — M5032 Other cervical disc degeneration, mid-cervical region, unspecified level: Secondary | ICD-10-CM | POA: Diagnosis not present

## 2020-09-22 DIAGNOSIS — M9903 Segmental and somatic dysfunction of lumbar region: Secondary | ICD-10-CM | POA: Diagnosis not present

## 2020-09-22 DIAGNOSIS — M9902 Segmental and somatic dysfunction of thoracic region: Secondary | ICD-10-CM | POA: Diagnosis not present

## 2020-09-22 DIAGNOSIS — M9901 Segmental and somatic dysfunction of cervical region: Secondary | ICD-10-CM | POA: Diagnosis not present

## 2020-09-22 DIAGNOSIS — M5136 Other intervertebral disc degeneration, lumbar region: Secondary | ICD-10-CM | POA: Diagnosis not present

## 2020-10-01 DIAGNOSIS — R69 Illness, unspecified: Secondary | ICD-10-CM | POA: Diagnosis not present

## 2020-10-01 DIAGNOSIS — M8580 Other specified disorders of bone density and structure, unspecified site: Secondary | ICD-10-CM | POA: Diagnosis not present

## 2020-10-01 DIAGNOSIS — G43009 Migraine without aura, not intractable, without status migrainosus: Secondary | ICD-10-CM | POA: Diagnosis not present

## 2020-10-01 DIAGNOSIS — K219 Gastro-esophageal reflux disease without esophagitis: Secondary | ICD-10-CM | POA: Diagnosis not present

## 2020-10-01 DIAGNOSIS — F4321 Adjustment disorder with depressed mood: Secondary | ICD-10-CM | POA: Diagnosis not present

## 2020-10-01 DIAGNOSIS — N183 Chronic kidney disease, stage 3 unspecified: Secondary | ICD-10-CM | POA: Diagnosis not present

## 2020-10-12 DIAGNOSIS — H5213 Myopia, bilateral: Secondary | ICD-10-CM | POA: Diagnosis not present

## 2020-10-12 DIAGNOSIS — H524 Presbyopia: Secondary | ICD-10-CM | POA: Diagnosis not present

## 2020-10-12 DIAGNOSIS — H52209 Unspecified astigmatism, unspecified eye: Secondary | ICD-10-CM | POA: Diagnosis not present

## 2020-10-13 DIAGNOSIS — Z85828 Personal history of other malignant neoplasm of skin: Secondary | ICD-10-CM | POA: Diagnosis not present

## 2020-10-13 DIAGNOSIS — C44722 Squamous cell carcinoma of skin of right lower limb, including hip: Secondary | ICD-10-CM | POA: Diagnosis not present

## 2020-10-13 DIAGNOSIS — L570 Actinic keratosis: Secondary | ICD-10-CM | POA: Diagnosis not present

## 2020-10-13 DIAGNOSIS — L438 Other lichen planus: Secondary | ICD-10-CM | POA: Diagnosis not present

## 2020-10-13 DIAGNOSIS — B029 Zoster without complications: Secondary | ICD-10-CM | POA: Diagnosis not present

## 2020-10-13 DIAGNOSIS — L821 Other seborrheic keratosis: Secondary | ICD-10-CM | POA: Diagnosis not present

## 2020-10-13 DIAGNOSIS — D485 Neoplasm of uncertain behavior of skin: Secondary | ICD-10-CM | POA: Diagnosis not present

## 2020-10-20 DIAGNOSIS — R69 Illness, unspecified: Secondary | ICD-10-CM | POA: Diagnosis not present

## 2020-10-26 DIAGNOSIS — M25561 Pain in right knee: Secondary | ICD-10-CM | POA: Diagnosis not present

## 2020-10-29 DIAGNOSIS — M25561 Pain in right knee: Secondary | ICD-10-CM | POA: Diagnosis not present

## 2020-11-03 DIAGNOSIS — M25561 Pain in right knee: Secondary | ICD-10-CM | POA: Diagnosis not present

## 2020-12-01 DIAGNOSIS — D2271 Melanocytic nevi of right lower limb, including hip: Secondary | ICD-10-CM | POA: Diagnosis not present

## 2020-12-01 DIAGNOSIS — L570 Actinic keratosis: Secondary | ICD-10-CM | POA: Diagnosis not present

## 2020-12-01 DIAGNOSIS — L821 Other seborrheic keratosis: Secondary | ICD-10-CM | POA: Diagnosis not present

## 2020-12-01 DIAGNOSIS — Z85828 Personal history of other malignant neoplasm of skin: Secondary | ICD-10-CM | POA: Diagnosis not present

## 2020-12-01 DIAGNOSIS — D2272 Melanocytic nevi of left lower limb, including hip: Secondary | ICD-10-CM | POA: Diagnosis not present

## 2020-12-01 DIAGNOSIS — D225 Melanocytic nevi of trunk: Secondary | ICD-10-CM | POA: Diagnosis not present

## 2020-12-01 DIAGNOSIS — M25561 Pain in right knee: Secondary | ICD-10-CM | POA: Diagnosis not present

## 2020-12-01 DIAGNOSIS — L814 Other melanin hyperpigmentation: Secondary | ICD-10-CM | POA: Diagnosis not present

## 2020-12-07 ENCOUNTER — Ambulatory Visit: Payer: Medicare HMO | Admitting: Physical Therapy

## 2020-12-09 ENCOUNTER — Other Ambulatory Visit: Payer: Self-pay

## 2020-12-09 ENCOUNTER — Ambulatory Visit (INDEPENDENT_AMBULATORY_CARE_PROVIDER_SITE_OTHER): Payer: Medicare HMO | Admitting: Physical Therapy

## 2020-12-09 DIAGNOSIS — R29898 Other symptoms and signs involving the musculoskeletal system: Secondary | ICD-10-CM

## 2020-12-09 DIAGNOSIS — M25561 Pain in right knee: Secondary | ICD-10-CM | POA: Diagnosis not present

## 2020-12-09 DIAGNOSIS — M6281 Muscle weakness (generalized): Secondary | ICD-10-CM

## 2020-12-09 DIAGNOSIS — G8929 Other chronic pain: Secondary | ICD-10-CM | POA: Diagnosis not present

## 2020-12-09 NOTE — Therapy (Addendum)
[place] [address] [place], [place], [address] Phone: [phone]   Fax:  [phone]  Physical Therapy Evaluation and Discharge  Patient Details  Name: Mary Freeman MRN: [id number] Date of Birth: 29-Apr-1951 Referring Provider (PT): Dr Inez Catalina   Encounter Date: 12/09/2020   PT End of Session - 12/09/20 1014    Visit Number 1    Number of Visits 6    Date for PT Re-Evaluation 01/20/21    Authorization Type aetna MCR, pnote 10th visit    Progress Note Due on Visit 10    PT Start Time 1016    PT Stop Time 1104    PT Time Calculation (min) 48 min    Activity Tolerance Patient tolerated treatment well    Behavior During Therapy [facility] for tasks assessed/performed           Past Medical History:  Diagnosis Date  . Depression   . Headache    migraines  . PONV (postoperative nausea and vomiting)     Past Surgical History:  Procedure Laterality Date  . APPENDECTOMY    . BREAST EXCISIONAL BIOPSY Right    benign  . COLONOSCOPY    . KNEE ARTHROSCOPY Right    x3  . KNEE ARTHROSCOPY W/ MENISCAL REPAIR Left   . LUMBAR LAMINECTOMY  1986  . REDUCTION MAMMAPLASTY Bilateral    2006  . ROTATOR CUFF REPAIR Right 2012  . TONSILLECTOMY    . TUBAL LIGATION      There were no vitals filed for this visit.    Subjective Assessment - 12/09/20 1016    Subjective Pt reports she developed knee pain this summer when playing pickle ball, wore a brace and it felt better.  She went bowling on 11/11 and the next day she had the worse pain ever and could barely walk.  She had a steriod injection and this took a lot of the pain away, went home and iced. See Dr Les Pou later this month    Diagnostic tests [place] - arthritis and possible small meniscus tear    Patient Stated Goals not have surgery and get knee to feel better so she can do her regular activity.    Currently in Pain? Yes    Pain Score 5    worse after work up to  7/10   Pain Location Knee    Pain Orientation Right;Medial   sometimes the pain goes behind the knee   Pain Descriptors / Indicators Aching;Throbbing    Pain Type Chronic pain    Pain Onset More than a month ago    Pain Frequency Constant    Aggravating Factors  standing, working in the yard - raking, squating    Pain Relieving Factors ice and sometimes meds    Multiple Pain Sites No              [facility] PT Assessment - 12/09/20 0001      Assessment   Medical Diagnosis Rt knee pain    Referring Provider (PT) Dr Inez Catalina    Onset Date/Surgical Date 10/08/21    Hand Dominance Right    Next MD Visit 01/12/2021    Prior Therapy not for this knee,yes for Lt knee scope and shoulder scopes      Precautions   Precautions None      Balance Screen   Has the patient fallen in the past 6 months No    Has the patient had a decrease  in activity level because of a fear of falling?  No    Is the patient reluctant to leave their home because of a fear of falling?  No   just is careful when walking outside     [place] residence    Home Layout Two level   one step at a time now.     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 Rt > 10 sec      Posture/Postural Control   Posture Comments WNL      ROM / Strength   AROM / PROM / Strength AROM;Strength      Strength   Strength Assessment Site Hip;Knee;Ankle    Right/Left Hip --   bilat hip abduction 4-/5, flex/extension 5/5   Right/Left Knee --   Lt 5/5,  Rt 4+/5   Right/Left Ankle --   5/5     Flexibility   Soft Tissue Assessment /Muscle Length yes    Hamstrings --   WNL   Quadriceps --   Rt 125 with pain , Lt 140     Palpation   Patella mobility good mobility, lateral tracking on Rt side    Palpation comment tender over Rt medial tibial platue                      Objective measurements completed on examination: See above findings.       [place] Adult PT Treatment/Exercise - 12/09/20 0001      Exercises   Exercises Knee/Hip      Knee/Hip Exercises: Stretches   Quad Stretch Right;30 seconds   prone with strap     Knee/Hip Exercises: Supine   Straight Leg Raise with External Rotation Strengthening;Right;3 sets;10 reps      Knee/Hip Exercises: Sidelying   Other Sidelying Knee/Hip Exercises Rt 10 reps each pilates FWD/BWD kicks, CW/CCW circles, FWD/BWD taps      Manual Therapy   Manual Therapy Taping    Kinesiotex Facilitate Muscle      Kinesiotix   Facilitate Muscle  K strip and I strip to correct lateral tracking of patella                  PT Education - 12/09/20 1336    Education Details HEP POC    Person(s) Educated Patient    Methods Explanation;Demonstration;Handout    Comprehension Returned demonstration;Verbalized understanding               PT Long Term Goals - 12/09/20 1340      PT LONG TERM GOAL #1   Title I with advanced HEP    Time 6    Period Weeks    Status New    Target Date 01/20/21      PT LONG TERM GOAL #2   Title increase bilat hip abductors =/> 5-/5 to allow her to  alternate gait on stairs with minimal to no knee pain    Time 6    Period Weeks    Status New    Target Date 01/20/21      PT LONG TERM GOAL #3   Title improve Rt quad flexibility to be within 5 degree of Lt    Time 6    Period Weeks    Status New    Target Date 01/20/21      PT LONG TERM GOAL #4   Title improve FOTO 63    Time 6    Period Weeks    Status New    Target Date 01/20/21       PT LONG TERM GOAL #5   Title report =/> 75% reduction of pain in knee with walking at work and in the yard    Time 6    Period Weeks    Status New    Target Date 01/20/21                  Plan - 12/09/20 1052    Clinical Impression Statement 70 yo female presents with h/o Rt knee pain.  It is now interfering with her ability to walk at work and for leisure.  She has weakness in bilat hip abductors, tightness in the Rt quad and pain with palpation along the medial tibial platue.   Currently she is having to take one step at a time due to pain.  She would benefit from PT to address her defecits and restore his PLOF    Personal Factors and Comorbidities Comorbidity 2    Comorbidities see snap shot    Examination-Activity Limitations Locomotion Level;Squat;Stairs    Examination-Participation Restrictions Other;Occupation;Yard Work    Stability/Clinical Decision Making Stable/Uncomplicated    Clinical Decision Making Low    Rehab Potential Good    PT Frequency 1x / week    PT Duration 6 weeks    PT Treatment/Interventions Iontophoresis 4mg /ml Dexamethasone;Taping;Vasopneumatic Device;Patient/family education;Moist Heat;Functional mobility training;Dry needling;Therapeutic exercise;Cryotherapy;Electrical Stimulation;Manual techniques;Neuromuscular re-education    PT Next Visit Plan hip abductor strength, quad flexibility and possible ionto to medial knee joint line.    PT Home Exercise Plan Access Code: [email or web address]  URL: [email or web address]  Date: 12/09/2020  Prepared by: Jeral Pinch    Exercises  Straight Leg Raise with External Rotation - 1 x daily - 3 sets - 10 reps  Beginner Side Kick - 1 x daily - 1 sets - 10 reps  Beginner Sidelying Leg Circle - 1 x daily - 1 sets - 10 reps  Sidelying Over and Back - 1 x daily - 1 sets - 10 reps  Prone Quadriceps Stretch with Strap - 1 x daily - 1 reps - 30-45 hold    Consulted and Agree with Plan of Care Patient            Patient will benefit from skilled therapeutic intervention in order to improve the following deficits and impairments:  Decreased range of motion,Difficulty walking,Pain,Decreased strength  Visit Diagnosis: Chronic pain of right knee - Plan: PT plan of care cert/re-cert  Muscle weakness (generalized) - Plan: PT plan of care cert/re-cert  Other symptoms and signs involving the musculoskeletal system - Plan: PT plan of care cert/re-cert     Problem List There are no problems to display for this patient.  PHYSICAL THERAPY DISCHARGE SUMMARY  Visits from Start of Care: 1  Current functional level related to goals / functional outcomes: N/a eval only  Remaining deficits: See above   Education / Equipment: HEP Plan: Patient agrees to discharge.  Patient goals were not met. Patient is being discharged due to not returning since the last visit.  ?????     Isabelle Course, PT,DPT03/11/229:54 AM  Jeral Pinch PT  12/09/2020, 1:45 PM  [facility] [place], [place], [address] Phone: [phone]   Fax:  [phone]  Name: Mary Freeman MRN: [id number] Date of Birth: Apr 14, 1951

## 2020-12-09 NOTE — Patient Instructions (Signed)
Access Code: [email or web address] URL: [email or web address] Date: 12/09/2020 Prepared by: Jeral Pinch  Exercises Straight Leg Raise with External Rotation - 1 x daily - 3 sets - 10 reps Beginner Side Kick - 1 x daily - 1 sets - 10 reps Beginner Sidelying Leg Circle - 1 x daily - 1 sets - 10 reps Sidelying Over and Back - 1 x daily - 1 sets - 10 reps Prone Quadriceps Stretch with Strap - 1 x daily - 1 reps - 30-45 hold

## 2020-12-15 ENCOUNTER — Other Ambulatory Visit: Payer: Self-pay | Admitting: Family Medicine

## 2020-12-15 DIAGNOSIS — Z1231 Encounter for screening mammogram for malignant neoplasm of breast: Secondary | ICD-10-CM

## 2020-12-16 ENCOUNTER — Encounter: Payer: Medicare HMO | Admitting: Physical Therapy

## 2020-12-17 DIAGNOSIS — M25561 Pain in right knee: Secondary | ICD-10-CM | POA: Diagnosis not present

## 2020-12-23 ENCOUNTER — Telehealth: Payer: Self-pay | Admitting: *Deleted

## 2020-12-23 NOTE — Telephone Encounter (Signed)
Returned call from 9:21 AM. Left patient a message that her appointment is on 01/11/2021 at 3:15 PM with a 3:00 PM arrival, not 02/08/2021 as stated on the voicemail.

## 2020-12-31 ENCOUNTER — Encounter: Payer: Self-pay | Admitting: *Deleted

## 2020-12-31 DIAGNOSIS — M1711 Unilateral primary osteoarthritis, right knee: Secondary | ICD-10-CM | POA: Diagnosis not present

## 2020-12-31 DIAGNOSIS — M899 Disorder of bone, unspecified: Secondary | ICD-10-CM | POA: Diagnosis not present

## 2020-12-31 DIAGNOSIS — M228X1 Other disorders of patella, right knee: Secondary | ICD-10-CM | POA: Diagnosis not present

## 2020-12-31 DIAGNOSIS — M67961 Unspecified disorder of synovium and tendon, right lower leg: Secondary | ICD-10-CM | POA: Diagnosis not present

## 2020-12-31 DIAGNOSIS — M23341 Other meniscus derangements, anterior horn of lateral meniscus, right knee: Secondary | ICD-10-CM | POA: Diagnosis not present

## 2020-12-31 DIAGNOSIS — S83241A Other tear of medial meniscus, current injury, right knee, initial encounter: Secondary | ICD-10-CM | POA: Diagnosis not present

## 2020-12-31 DIAGNOSIS — M25461 Effusion, right knee: Secondary | ICD-10-CM | POA: Diagnosis not present

## 2021-01-04 ENCOUNTER — Encounter: Payer: Medicare HMO | Admitting: Obstetrics & Gynecology

## 2021-01-06 DIAGNOSIS — S83241A Other tear of medial meniscus, current injury, right knee, initial encounter: Secondary | ICD-10-CM | POA: Diagnosis not present

## 2021-01-06 DIAGNOSIS — M25561 Pain in right knee: Secondary | ICD-10-CM | POA: Diagnosis not present

## 2021-01-11 ENCOUNTER — Other Ambulatory Visit: Payer: Self-pay

## 2021-01-11 ENCOUNTER — Encounter: Payer: Self-pay | Admitting: Obstetrics & Gynecology

## 2021-01-11 ENCOUNTER — Ambulatory Visit: Payer: Medicare HMO | Admitting: Obstetrics & Gynecology

## 2021-01-11 VITALS — BP 83/47 | HR 81 | Resp 16 | Ht 67.5 in | Wt 123.0 lb

## 2021-01-11 DIAGNOSIS — R197 Diarrhea, unspecified: Secondary | ICD-10-CM | POA: Diagnosis not present

## 2021-01-11 DIAGNOSIS — R1010 Upper abdominal pain, unspecified: Secondary | ICD-10-CM

## 2021-01-11 DIAGNOSIS — R140 Abdominal distension (gaseous): Secondary | ICD-10-CM | POA: Diagnosis not present

## 2021-01-11 DIAGNOSIS — R102 Pelvic and perineal pain: Secondary | ICD-10-CM | POA: Diagnosis not present

## 2021-01-11 DIAGNOSIS — R6881 Early satiety: Secondary | ICD-10-CM

## 2021-01-11 DIAGNOSIS — R634 Abnormal weight loss: Secondary | ICD-10-CM | POA: Diagnosis not present

## 2021-01-11 DIAGNOSIS — R1084 Generalized abdominal pain: Secondary | ICD-10-CM

## 2021-01-11 NOTE — Progress Notes (Unsigned)
Bloating x 6-47months.  Weight loss in last year.  Pimple like area on Left labia. Mammogram scheduled.

## 2021-01-11 NOTE — Progress Notes (Signed)
   Subjective:    Patient ID: Mary Freeman, female    DOB: [birth year], 70 y.o.   MRN: [id number]  HPI  Pt here to establish new care.  Pt having early satiety, bloating for about a year.  Pt has lost from size 10 to size 4, 15-20 pounds.  Pt has history of microscopic colitis.  No bloody stools or tarry black stools.  Pt does endorse onset diarrhea for the past month.  She has microcolitis and attributes the change to that.  She is making an appt with GI (referred to carl Carlean Purl).  No mucous in stools. No bladder changes.    Review of Systems  Constitutional: Positive for appetite change and unexpected weight change.  Respiratory: Negative.   Cardiovascular: Negative.   Gastrointestinal: Positive for abdominal distention, diarrhea and rectal pain. Negative for anal bleeding, blood in stool, constipation, nausea and vomiting.  Genitourinary: Positive for pelvic pain. Negative for dysuria, flank pain and vaginal pain.  Musculoskeletal: Negative.   Neurological: Negative.   Psychiatric/Behavioral: Negative.        Objective:   Physical Exam Vitals reviewed.  Constitutional:      General: She is not in acute distress.    Appearance: She is well-developed and well-nourished.  HENT:     Head: Normocephalic and atraumatic.  Eyes:     Conjunctiva/sclera: Conjunctivae normal.  Cardiovascular:     Rate and Rhythm: Normal rate.  Pulmonary:     Effort: Pulmonary effort is normal.  Abdominal:     General: Abdomen is flat. Bowel sounds are normal. There is no distension.     Palpations: Abdomen is soft. There is no mass.     Tenderness: There is no abdominal tenderness. There is no guarding or rebound.  Genitourinary:    Comments: Tanner V Vulva:  No lesion Vagina:  Pink, no lesions, no discharge, no blood Cervix:  No CMT Uterus:  Non tender, mobile Right adnexa--non tender, no mass Left adnexa--non tender, no mass Rectum: hemorrhoids Musculoskeletal:        General: No edema.   Skin:    General: Skin is warm and dry.  Neurological:     Mental Status: She is alert and oriented to person, place, and time.  Psychiatric:        Mood and Affect: Mood and affect and mood normal.        Thought Content: Thought content normal.    Vitals:   01/11/21 1511  BP: (!) 83/47  Pulse: 81  Resp: 16  Weight: 123 lb (55.8 kg)  Height: 5' 7.5" (1.715 m)     Assessment & Plan:  69 yo female with 15-20 pound weight loss, unintentional with bloating, early satiety, pelvic and abdominal pain. 1.  TVUS to evaluate ovaries. 2.  Referral to GI 3.  Will base treatment on results  30 minutes spent with patient, review of records (care everywhere), counseling and documentation.

## 2021-01-12 DIAGNOSIS — R140 Abdominal distension (gaseous): Secondary | ICD-10-CM

## 2021-01-12 DIAGNOSIS — R109 Unspecified abdominal pain: Secondary | ICD-10-CM

## 2021-01-12 DIAGNOSIS — R6881 Early satiety: Secondary | ICD-10-CM | POA: Insufficient documentation

## 2021-01-12 HISTORY — DX: Unspecified abdominal pain: R10.9

## 2021-01-12 HISTORY — DX: Early satiety: R68.81

## 2021-01-12 HISTORY — DX: Abdominal distension (gaseous): R14.0

## 2021-01-13 ENCOUNTER — Encounter: Payer: Self-pay | Admitting: Internal Medicine

## 2021-01-25 ENCOUNTER — Other Ambulatory Visit: Payer: Self-pay

## 2021-01-25 DIAGNOSIS — R102 Pelvic and perineal pain: Secondary | ICD-10-CM

## 2021-01-25 NOTE — Progress Notes (Signed)
U/S order corrected and placed per Silas [place], MD

## 2021-01-26 DIAGNOSIS — M9902 Segmental and somatic dysfunction of thoracic region: Secondary | ICD-10-CM | POA: Diagnosis not present

## 2021-01-26 DIAGNOSIS — M5136 Other intervertebral disc degeneration, lumbar region: Secondary | ICD-10-CM | POA: Diagnosis not present

## 2021-01-26 DIAGNOSIS — M5032 Other cervical disc degeneration, mid-cervical region, unspecified level: Secondary | ICD-10-CM | POA: Diagnosis not present

## 2021-01-26 DIAGNOSIS — M5137 Other intervertebral disc degeneration, lumbosacral region: Secondary | ICD-10-CM | POA: Diagnosis not present

## 2021-01-26 DIAGNOSIS — M9901 Segmental and somatic dysfunction of cervical region: Secondary | ICD-10-CM | POA: Diagnosis not present

## 2021-01-26 DIAGNOSIS — M9903 Segmental and somatic dysfunction of lumbar region: Secondary | ICD-10-CM | POA: Diagnosis not present

## 2021-01-26 DIAGNOSIS — M5134 Other intervertebral disc degeneration, thoracic region: Secondary | ICD-10-CM | POA: Diagnosis not present

## 2021-01-26 DIAGNOSIS — M9904 Segmental and somatic dysfunction of sacral region: Secondary | ICD-10-CM | POA: Diagnosis not present

## 2021-01-27 ENCOUNTER — Other Ambulatory Visit: Payer: Self-pay

## 2021-01-27 ENCOUNTER — Ambulatory Visit: Payer: Medicare HMO

## 2021-01-27 ENCOUNTER — Ambulatory Visit
Admission: RE | Admit: 2021-01-27 | Discharge: 2021-01-27 | Disposition: A | Discharge status: Home / Self Care | Payer: Medicare HMO | Source: Ambulatory Visit | Attending: Obstetrics & Gynecology | Admitting: Obstetrics & Gynecology

## 2021-01-27 DIAGNOSIS — R102 Pelvic and perineal pain: Secondary | ICD-10-CM | POA: Insufficient documentation

## 2021-01-27 DIAGNOSIS — Z780 Asymptomatic menopausal state: Secondary | ICD-10-CM | POA: Diagnosis not present

## 2021-02-01 DIAGNOSIS — Z0000 Encounter for general adult medical examination without abnormal findings: Secondary | ICD-10-CM | POA: Diagnosis not present

## 2021-02-01 DIAGNOSIS — Z136 Encounter for screening for cardiovascular disorders: Secondary | ICD-10-CM | POA: Diagnosis not present

## 2021-02-01 DIAGNOSIS — Z681 Body mass index (BMI) 19 or less, adult: Secondary | ICD-10-CM | POA: Diagnosis not present

## 2021-02-01 DIAGNOSIS — G43009 Migraine without aura, not intractable, without status migrainosus: Secondary | ICD-10-CM | POA: Diagnosis not present

## 2021-02-01 DIAGNOSIS — N183 Chronic kidney disease, stage 3 unspecified: Secondary | ICD-10-CM | POA: Diagnosis not present

## 2021-02-01 DIAGNOSIS — M8580 Other specified disorders of bone density and structure, unspecified site: Secondary | ICD-10-CM | POA: Diagnosis not present

## 2021-02-01 DIAGNOSIS — R197 Diarrhea, unspecified: Secondary | ICD-10-CM | POA: Diagnosis not present

## 2021-02-04 DIAGNOSIS — K529 Noninfective gastroenteritis and colitis, unspecified: Secondary | ICD-10-CM | POA: Diagnosis not present

## 2021-02-04 DIAGNOSIS — K52831 Collagenous colitis: Secondary | ICD-10-CM | POA: Diagnosis not present

## 2021-02-04 DIAGNOSIS — R634 Abnormal weight loss: Secondary | ICD-10-CM | POA: Diagnosis not present

## 2021-02-06 ENCOUNTER — Emergency Department
Admission: EM | Admit: 2021-02-06 | Discharge: 2021-02-06 | Disposition: A | Discharge status: Home / Self Care | Payer: Medicare HMO | Source: Home / Self Care | Attending: Family Medicine | Admitting: Family Medicine

## 2021-02-06 ENCOUNTER — Other Ambulatory Visit: Payer: Self-pay

## 2021-02-06 ENCOUNTER — Emergency Department (INDEPENDENT_AMBULATORY_CARE_PROVIDER_SITE_OTHER): Payer: Medicare HMO

## 2021-02-06 DIAGNOSIS — W19XXXA Unspecified fall, initial encounter: Secondary | ICD-10-CM

## 2021-02-06 DIAGNOSIS — M25532 Pain in left wrist: Secondary | ICD-10-CM | POA: Diagnosis not present

## 2021-02-06 DIAGNOSIS — S63502A Unspecified sprain of left wrist, initial encounter: Secondary | ICD-10-CM

## 2021-02-06 MED ORDER — ACETAMINOPHEN-CODEINE #3 300-30 MG PO TABS: 1.0000 | ORAL_TABLET | Freq: Four times a day (QID) | ORAL | 0 refills | Status: DC | PRN

## 2021-02-06 NOTE — ED Provider Notes (Signed)
[name]    CSN: [id number] Arrival date & time: 02/06/21  1321      History   Chief Complaint Chief Complaint  Patient presents with  . Wrist Pain    HPI Mary Freeman is a 70 y.o. female.   HPI  Patient fell yesterday and landed on her left arm.  She has bruising in her left shoulder area.  Left shoulder moves well and she does not think anything is broken.  She has some abrasion and bruising on her left wrist.  Pain with grasping.  Pain with movement of wrist.  Is worried about fracture.  She has been using ice.  She has been taking Tylenol.  The Tylenol is not giving her good pain relief  Past Medical History:  Diagnosis Date  . Depression   . Headache    migraines  . PONV (postoperative nausea and vomiting)     Patient Active Problem List   Diagnosis Date Noted  . Early satiety 01/12/2021  . Bloating 01/12/2021  . Abdominal pain 01/12/2021    Past Surgical History:  Procedure Laterality Date  . APPENDECTOMY    . BREAST EXCISIONAL BIOPSY Right    benign  . COLONOSCOPY    . KNEE ARTHROSCOPY Right    x3  . KNEE ARTHROSCOPY W/ MENISCAL REPAIR Left   . LUMBAR LAMINECTOMY  1986  . REDUCTION MAMMAPLASTY Bilateral    2006  . ROTATOR CUFF REPAIR Right 2012  . TONSILLECTOMY    . TUBAL LIGATION      OB History    Gravida  3   Para  3   Term  3   Preterm      AB      Living        SAB      IAB      Ectopic      Multiple      Live Births  3            Home Medications    Prior to Admission medications   Medication Sig Start Date End Date Taking? Authorizing Provider  acetaminophen-codeine (TYLENOL #3) 300-30 MG tablet Take 1-2 tablets by mouth every 6 (six) hours as needed for moderate pain. 02/06/21  Yes Raylene Everts, MD  Calcium-Magnesium (CAL-MAG PO) Take 1 tablet by mouth daily.   Yes [provider]  cholecalciferol (VITAMIN D) 1000 units tablet Take 1,000 Units by mouth daily.   Yes [provider]  LORazepam (ATIVAN) 0.5 MG tablet Take 0.5 mg by mouth at bedtime as needed for sleep.   Yes [provider]  Multiple Vitamins-Minerals (MULTIVITAMIN WITH MINERALS) tablet Take 1 tablet by mouth daily.   Yes [provider]  rizatriptan (MAXALT) 10 MG tablet Take 10 mg by mouth as needed for migraine. May repeat in 2 hours if needed    [provider]    Family History Family History  Problem Relation Age of Onset  . Cancer Father        lung  . Cancer Sister        breast  . Breast cancer Sister 21  . Alcoholism Sister   . Alcoholism Mother   . Alcoholism Brother   . Cancer Sister   . Breast cancer Sister 52  . Alcoholism Daughter   . Alcoholism Daughter     Social History Social History   Tobacco Use  . Smoking status: Never Smoker  . Smokeless tobacco: Never  Used  Vaping Use  . Vaping Use: Never used  Substance Use Topics  . Alcohol use: Yes    Comment: very rare   . Drug use: No     Allergies   Amitriptyline, Dilaudid [hydromorphone], Diphenhydramine, Stadol [butorphanol], Talwin [pentazocine], Benadryl [diphenhydramine hcl], Flagyl [metronidazole], Solu-medrol [methylprednisolone], Butorphanol tartrate, Hydrocodone-acetaminophen, Hydromorphone hcl, Morphine and related, Other, Oxycodone-acetaminophen, Phenergan [promethazine], and Sulfa antibiotics   Review of Systems Review of Systems See HPI  Physical Exam Triage Vital Signs ED Triage Vitals  Enc Vitals Group     BP 02/06/21 1343 102/65     Pulse Rate 02/06/21 1343 86     Resp --      Temp 02/06/21 1343 97.6 F (36.4 C)     Temp Source 02/06/21 1343 Oral     SpO2 02/06/21 1343 95 %     Weight 02/06/21 1341 117 lb 4.8 oz (53.2 kg)     Height 02/06/21 1341 5' 7.5" (1.715 m)     Head Circumference --      Peak Flow --      Pain Score 02/06/21 1341 8     Pain Loc --      Pain Edu? --      Excl. in [place]? --    No data found.  Updated Vital Signs BP 102/65  (BP Location: Right Arm)   Pulse 86   Temp 97.6 F (36.4 C) (Oral)   Ht 5' 7.5" (1.715 m)   Wt 53.2 kg   SpO2 95%   BMI 18.10 kg/m      Physical Exam Constitutional:      General: She is not in acute distress.    Appearance: She is well-developed and normal weight.     Comments: Pleasant.  Cooperative.  No acute distress  HENT:     Head: Normocephalic and atraumatic.  Eyes:     Conjunctiva/sclera: Conjunctivae normal.     Pupils: Pupils are equal, round, and reactive to light.  Cardiovascular:     Rate and Rhythm: Normal rate.  Pulmonary:     Effort: Pulmonary effort is normal. No respiratory distress.  Abdominal:     General: There is no distension.     Palpations: Abdomen is soft.  Musculoskeletal:        General: Signs of injury present. Normal range of motion.     Cervical back: Normal range of motion.     Comments: There is bruising on the left distal humerus, anteriorly.  No bony tenderness.  Good range of motion.  There is no tenderness or lack of motion in elbow.  Wrist has abrasion over the ulnar styloid and over the proximal portion of the thenar eminence.  Some bruising.  Mild swelling.  Tenderness with palpation of the distal radius and ulna.  No snuffbox tenderness.  Skin:    General: Skin is warm and dry.  Neurological:     Mental Status: She is alert.      UC Treatments / Results  Labs (all labs ordered are listed, but only abnormal results are displayed) Labs Reviewed - No data to display  EKG   Radiology DG Wrist Complete Left  Result Date: 02/06/2021 CLINICAL DATA:  Pain following fall EXAM: LEFT WRIST - COMPLETE 3+ VIEW COMPARISON:  None. FINDINGS: Frontal, oblique, lateral, and ulnar deviation scaphoid images were obtained. No fracture or dislocation. Joint spaces appear normal. No erosive change. IMPRESSION: No fracture or dislocation.  No evident arthropathy. Electronically Signed   By:  Lowella Grip III M.D.   On: 02/06/2021 14:34     Procedures Procedures (including critical care time)  Medications Ordered in UC Medications - No data to display  Initial Impression / Assessment and Plan / UC Course  I have reviewed the triage vital signs and the nursing notes.  Pertinent labs & imaging results that were available during my care of the patient were reviewed by me and considered in my medical decision making (see chart for details).     No fractures identified.  Symptomatic care at home discussed Final Clinical Impressions(s) / UC Diagnoses   Final diagnoses:  Sprain of left wrist, initial encounter  Fall, initial encounter     Discharge Instructions     Wear brace as needed for comfort Use ice for 20 minutes every couple of hours Take Tylenol for moderate pain Take Tylenol No. 3 for severe pain Follow-up with your primary care doctor if needed   ED Prescriptions    Medication Sig Dispense Auth. Provider   acetaminophen-codeine (TYLENOL #3) 300-30 MG tablet Take 1-2 tablets by mouth every 6 (six) hours as needed for moderate pain. 10 tablet Raylene Everts, MD     I have reviewed the PDMP during this encounter.   Raylene Everts, MD 02/06/21 [phone]

## 2021-02-06 NOTE — Discharge Instructions (Signed)
Wear brace as needed for comfort Use ice for 20 minutes every couple of hours Take Tylenol for moderate pain Take Tylenol No. 3 for severe pain Follow-up with your primary care doctor if needed

## 2021-02-06 NOTE — ED Triage Notes (Signed)
Pt states that she fell and injured her left wrist. x2 days

## 2021-02-09 DIAGNOSIS — R69 Illness, unspecified: Secondary | ICD-10-CM | POA: Diagnosis not present

## 2021-02-09 DIAGNOSIS — N183 Chronic kidney disease, stage 3 unspecified: Secondary | ICD-10-CM | POA: Diagnosis not present

## 2021-02-09 DIAGNOSIS — M8580 Other specified disorders of bone density and structure, unspecified site: Secondary | ICD-10-CM | POA: Diagnosis not present

## 2021-02-09 DIAGNOSIS — K219 Gastro-esophageal reflux disease without esophagitis: Secondary | ICD-10-CM | POA: Diagnosis not present

## 2021-02-09 DIAGNOSIS — G43009 Migraine without aura, not intractable, without status migrainosus: Secondary | ICD-10-CM | POA: Diagnosis not present

## 2021-02-11 ENCOUNTER — Encounter: Payer: Self-pay | Admitting: Obstetrics & Gynecology

## 2021-02-11 ENCOUNTER — Ambulatory Visit: Payer: Medicare HMO | Admitting: Obstetrics & Gynecology

## 2021-02-11 ENCOUNTER — Other Ambulatory Visit: Payer: Self-pay

## 2021-02-11 VITALS — BP 100/46 | HR 74 | Ht 67.0 in | Wt 118.0 lb

## 2021-02-11 DIAGNOSIS — M8580 Other specified disorders of bone density and structure, unspecified site: Secondary | ICD-10-CM | POA: Insufficient documentation

## 2021-02-11 DIAGNOSIS — K52839 Microscopic colitis, unspecified: Secondary | ICD-10-CM

## 2021-02-11 DIAGNOSIS — G43009 Migraine without aura, not intractable, without status migrainosus: Secondary | ICD-10-CM

## 2021-02-11 DIAGNOSIS — F419 Anxiety disorder, unspecified: Secondary | ICD-10-CM | POA: Insufficient documentation

## 2021-02-11 DIAGNOSIS — F32A Depression, unspecified: Secondary | ICD-10-CM | POA: Insufficient documentation

## 2021-02-11 DIAGNOSIS — R6881 Early satiety: Secondary | ICD-10-CM

## 2021-02-11 DIAGNOSIS — F4321 Adjustment disorder with depressed mood: Secondary | ICD-10-CM | POA: Insufficient documentation

## 2021-02-11 DIAGNOSIS — N183 Chronic kidney disease, stage 3 unspecified: Secondary | ICD-10-CM

## 2021-02-11 DIAGNOSIS — K219 Gastro-esophageal reflux disease without esophagitis: Secondary | ICD-10-CM

## 2021-02-11 DIAGNOSIS — K52831 Collagenous colitis: Secondary | ICD-10-CM

## 2021-02-11 DIAGNOSIS — F320 Major depressive disorder, single episode, mild: Secondary | ICD-10-CM | POA: Insufficient documentation

## 2021-02-11 HISTORY — DX: Adjustment disorder with depressed mood: F43.21

## 2021-02-11 HISTORY — DX: Depression, unspecified: F32.A

## 2021-02-11 HISTORY — DX: Anxiety disorder, unspecified: F41.9

## 2021-02-11 HISTORY — DX: Chronic kidney disease, stage 3 unspecified: N18.30

## 2021-02-11 HISTORY — DX: Gastro-esophageal reflux disease without esophagitis: K21.9

## 2021-02-11 HISTORY — DX: Other specified disorders of bone density and structure, unspecified site: M85.80

## 2021-02-11 HISTORY — DX: Collagenous colitis: K52.831

## 2021-02-11 HISTORY — DX: Migraine without aura, not intractable, without status migrainosus: G43.009

## 2021-02-11 NOTE — Progress Notes (Signed)
   Subjective:    Patient ID: Mary Freeman, female    DOB: 10-29-1951, 70 y.o.   MRN: [id number]  HPI 70 yo female presents for bloating and weight loss.  Pt has diarrhea that was treated with an oral steroid.  She is feeling much better and having normal BMs.  She feels much better emotionally and thinks the bowel is the etiology.    CLINICAL DATA:  Pelvic pain, assess ovaries; postmenopausal  EXAM: TRANSABDOMINAL AND TRANSVAGINAL ULTRASOUND OF PELVIS  TECHNIQUE: Both transabdominal and transvaginal ultrasound examinations of the pelvis were performed. Transabdominal technique was performed for global imaging of the pelvis including uterus, ovaries, adnexal regions, and pelvic cul-de-sac. It was necessary to proceed with endovaginal exam following the transabdominal exam to visualize the cervix and ovaries.  COMPARISON:  None  FINDINGS: Uterus  Measurements: 5.7 x 1.9 x 3.3 cm = volume: 19 mL. Anteverted. Normal morphology without mass  Endometrium  Thickness: 3 mm.  No endometrial fluid or focal abnormality  Right ovary  Not visualized, likely obscured by bowel  Left ovary  Not visualized, likely obscured by bowel  Other findings  No free pelvic fluid or adnexal masses.  IMPRESSION: Nonvisualization of ovaries.  Normal appearing uterus and endometrial complex.  No pelvic sonographic abnormalities identified.   Electronically Signed   By: Lavonia Dana M.D.   On: 01/27/2021 12:05 Review of Systems  Constitutional: Negative.   Respiratory: Negative.   Cardiovascular: Negative.   Gastrointestinal: Negative.        Diarrhea resolved with oral steroid taper  Genitourinary: Negative.        Objective:   Physical Exam Vitals reviewed.  Constitutional:      General: She is not in acute distress.    Appearance: She is well-developed.  HENT:     Head: Normocephalic and atraumatic.  Eyes:     Conjunctiva/sclera: Conjunctivae normal.   Cardiovascular:     Rate and Rhythm: Normal rate.  Pulmonary:     Effort: Pulmonary effort is normal.  Skin:    General: Skin is warm and dry.  Neurological:     Mental Status: She is alert and oriented to person, place, and time.  Psychiatric:        Mood and Affect: Mood normal.        Behavior: Behavior normal.     Comments: Relaxed, more at ease with symptoms.    Vitals:   02/11/21 1308  BP: (!) 100/46  Pulse: 74  Weight: 118 lb (53.5 kg)  Height: 5\' 7"  (1.702 m)      Assessment & Plan:  70 yo female with abdominal bloating, weight loss, and diarrhea-->improving 1.  Continue care with [facility] 2.  Pt to call us if she worsens and wants further evaluation of her ovaries.  If bowel gas is better we could repeat US or go to CT scan. 3.  Yearly mammograms 4.  Bone health discussed 5.  Routine health maintenance with PCP  20 minutes spent with patient, review of records, counseling and documentation.

## 2021-02-22 DIAGNOSIS — L570 Actinic keratosis: Secondary | ICD-10-CM | POA: Diagnosis not present

## 2021-02-22 DIAGNOSIS — D0471 Carcinoma in situ of skin of right lower limb, including hip: Secondary | ICD-10-CM | POA: Diagnosis not present

## 2021-02-22 DIAGNOSIS — D485 Neoplasm of uncertain behavior of skin: Secondary | ICD-10-CM | POA: Diagnosis not present

## 2021-03-01 ENCOUNTER — Other Ambulatory Visit (HOSPITAL_COMMUNITY): Payer: Self-pay | Admitting: Gastroenterology

## 2021-03-01 DIAGNOSIS — K52831 Collagenous colitis: Secondary | ICD-10-CM | POA: Diagnosis not present

## 2021-03-01 DIAGNOSIS — R634 Abnormal weight loss: Secondary | ICD-10-CM

## 2021-03-03 ENCOUNTER — Ambulatory Visit: Payer: Medicare HMO | Admitting: Internal Medicine

## 2021-03-05 ENCOUNTER — Ambulatory Visit (HOSPITAL_COMMUNITY)
Admission: RE | Admit: 2021-03-05 | Discharge: 2021-03-05 | Disposition: A | Discharge status: Home / Self Care | Payer: Medicare HMO | Source: Ambulatory Visit | Attending: Gastroenterology | Admitting: Gastroenterology

## 2021-03-05 ENCOUNTER — Encounter (HOSPITAL_COMMUNITY): Payer: Self-pay

## 2021-03-05 ENCOUNTER — Other Ambulatory Visit: Payer: Self-pay

## 2021-03-05 ENCOUNTER — Ambulatory Visit (HOSPITAL_COMMUNITY): Payer: Medicare HMO

## 2021-03-05 DIAGNOSIS — R634 Abnormal weight loss: Secondary | ICD-10-CM | POA: Diagnosis not present

## 2021-03-05 DIAGNOSIS — K5750 Diverticulosis of both small and large intestine without perforation or abscess without bleeding: Secondary | ICD-10-CM | POA: Diagnosis not present

## 2021-03-05 DIAGNOSIS — Z9049 Acquired absence of other specified parts of digestive tract: Secondary | ICD-10-CM | POA: Diagnosis not present

## 2021-03-10 DIAGNOSIS — X58XXXA Exposure to other specified factors, initial encounter: Secondary | ICD-10-CM | POA: Diagnosis not present

## 2021-03-10 DIAGNOSIS — G8918 Other acute postprocedural pain: Secondary | ICD-10-CM | POA: Diagnosis not present

## 2021-03-10 DIAGNOSIS — M958 Other specified acquired deformities of musculoskeletal system: Secondary | ICD-10-CM | POA: Diagnosis not present

## 2021-03-10 DIAGNOSIS — Y999 Unspecified external cause status: Secondary | ICD-10-CM | POA: Diagnosis not present

## 2021-03-10 DIAGNOSIS — M23221 Derangement of posterior horn of medial meniscus due to old tear or injury, right knee: Secondary | ICD-10-CM | POA: Diagnosis not present

## 2021-03-10 DIAGNOSIS — S83231A Complex tear of medial meniscus, current injury, right knee, initial encounter: Secondary | ICD-10-CM | POA: Diagnosis not present

## 2021-03-10 DIAGNOSIS — S83231D Complex tear of medial meniscus, current injury, right knee, subsequent encounter: Secondary | ICD-10-CM | POA: Diagnosis not present

## 2021-03-10 DIAGNOSIS — M23321 Other meniscus derangements, posterior horn of medial meniscus, right knee: Secondary | ICD-10-CM | POA: Diagnosis not present

## 2021-03-24 DIAGNOSIS — M9903 Segmental and somatic dysfunction of lumbar region: Secondary | ICD-10-CM | POA: Diagnosis not present

## 2021-03-24 DIAGNOSIS — M9902 Segmental and somatic dysfunction of thoracic region: Secondary | ICD-10-CM | POA: Diagnosis not present

## 2021-03-24 DIAGNOSIS — M9904 Segmental and somatic dysfunction of sacral region: Secondary | ICD-10-CM | POA: Diagnosis not present

## 2021-03-24 DIAGNOSIS — M5032 Other cervical disc degeneration, mid-cervical region, unspecified level: Secondary | ICD-10-CM | POA: Diagnosis not present

## 2021-03-24 DIAGNOSIS — M5134 Other intervertebral disc degeneration, thoracic region: Secondary | ICD-10-CM | POA: Diagnosis not present

## 2021-03-24 DIAGNOSIS — M5137 Other intervertebral disc degeneration, lumbosacral region: Secondary | ICD-10-CM | POA: Diagnosis not present

## 2021-03-24 DIAGNOSIS — M5136 Other intervertebral disc degeneration, lumbar region: Secondary | ICD-10-CM | POA: Diagnosis not present

## 2021-03-24 DIAGNOSIS — M9901 Segmental and somatic dysfunction of cervical region: Secondary | ICD-10-CM | POA: Diagnosis not present

## 2021-03-25 DIAGNOSIS — K52831 Collagenous colitis: Secondary | ICD-10-CM | POA: Diagnosis not present

## 2021-03-26 ENCOUNTER — Ambulatory Visit: Payer: Medicare HMO

## 2021-03-29 DIAGNOSIS — Z1231 Encounter for screening mammogram for malignant neoplasm of breast: Secondary | ICD-10-CM | POA: Diagnosis not present

## 2021-03-31 DIAGNOSIS — K219 Gastro-esophageal reflux disease without esophagitis: Secondary | ICD-10-CM | POA: Diagnosis not present

## 2021-03-31 DIAGNOSIS — N183 Chronic kidney disease, stage 3 unspecified: Secondary | ICD-10-CM | POA: Diagnosis not present

## 2021-03-31 DIAGNOSIS — G43009 Migraine without aura, not intractable, without status migrainosus: Secondary | ICD-10-CM | POA: Diagnosis not present

## 2021-03-31 DIAGNOSIS — M8580 Other specified disorders of bone density and structure, unspecified site: Secondary | ICD-10-CM | POA: Diagnosis not present

## 2021-03-31 DIAGNOSIS — R69 Illness, unspecified: Secondary | ICD-10-CM | POA: Diagnosis not present

## 2021-04-19 DIAGNOSIS — K52831 Collagenous colitis: Secondary | ICD-10-CM | POA: Diagnosis not present

## 2021-04-21 DIAGNOSIS — C44722 Squamous cell carcinoma of skin of right lower limb, including hip: Secondary | ICD-10-CM | POA: Diagnosis not present

## 2021-04-21 DIAGNOSIS — C44519 Basal cell carcinoma of skin of other part of trunk: Secondary | ICD-10-CM | POA: Diagnosis not present

## 2021-04-21 DIAGNOSIS — D485 Neoplasm of uncertain behavior of skin: Secondary | ICD-10-CM | POA: Diagnosis not present

## 2021-04-21 DIAGNOSIS — L570 Actinic keratosis: Secondary | ICD-10-CM | POA: Diagnosis not present

## 2021-04-21 DIAGNOSIS — D0471 Carcinoma in situ of skin of right lower limb, including hip: Secondary | ICD-10-CM | POA: Diagnosis not present

## 2021-04-21 DIAGNOSIS — C44619 Basal cell carcinoma of skin of left upper limb, including shoulder: Secondary | ICD-10-CM | POA: Diagnosis not present

## 2021-05-21 DIAGNOSIS — M5136 Other intervertebral disc degeneration, lumbar region: Secondary | ICD-10-CM | POA: Diagnosis not present

## 2021-05-21 DIAGNOSIS — M5032 Other cervical disc degeneration, mid-cervical region, unspecified level: Secondary | ICD-10-CM | POA: Diagnosis not present

## 2021-05-21 DIAGNOSIS — M9903 Segmental and somatic dysfunction of lumbar region: Secondary | ICD-10-CM | POA: Diagnosis not present

## 2021-05-21 DIAGNOSIS — M9904 Segmental and somatic dysfunction of sacral region: Secondary | ICD-10-CM | POA: Diagnosis not present

## 2021-05-21 DIAGNOSIS — M9902 Segmental and somatic dysfunction of thoracic region: Secondary | ICD-10-CM | POA: Diagnosis not present

## 2021-05-21 DIAGNOSIS — M5137 Other intervertebral disc degeneration, lumbosacral region: Secondary | ICD-10-CM | POA: Diagnosis not present

## 2021-05-21 DIAGNOSIS — M5134 Other intervertebral disc degeneration, thoracic region: Secondary | ICD-10-CM | POA: Diagnosis not present

## 2021-05-21 DIAGNOSIS — M9901 Segmental and somatic dysfunction of cervical region: Secondary | ICD-10-CM | POA: Diagnosis not present

## 2021-06-07 DIAGNOSIS — M5134 Other intervertebral disc degeneration, thoracic region: Secondary | ICD-10-CM | POA: Diagnosis not present

## 2021-06-07 DIAGNOSIS — M9902 Segmental and somatic dysfunction of thoracic region: Secondary | ICD-10-CM | POA: Diagnosis not present

## 2021-06-07 DIAGNOSIS — M5136 Other intervertebral disc degeneration, lumbar region: Secondary | ICD-10-CM | POA: Diagnosis not present

## 2021-06-07 DIAGNOSIS — M5137 Other intervertebral disc degeneration, lumbosacral region: Secondary | ICD-10-CM | POA: Diagnosis not present

## 2021-06-07 DIAGNOSIS — M5032 Other cervical disc degeneration, mid-cervical region, unspecified level: Secondary | ICD-10-CM | POA: Diagnosis not present

## 2021-06-07 DIAGNOSIS — M9903 Segmental and somatic dysfunction of lumbar region: Secondary | ICD-10-CM | POA: Diagnosis not present

## 2021-06-07 DIAGNOSIS — M9901 Segmental and somatic dysfunction of cervical region: Secondary | ICD-10-CM | POA: Diagnosis not present

## 2021-06-07 DIAGNOSIS — M9904 Segmental and somatic dysfunction of sacral region: Secondary | ICD-10-CM | POA: Diagnosis not present

## 2021-07-12 DIAGNOSIS — M5137 Other intervertebral disc degeneration, lumbosacral region: Secondary | ICD-10-CM | POA: Diagnosis not present

## 2021-07-12 DIAGNOSIS — M5136 Other intervertebral disc degeneration, lumbar region: Secondary | ICD-10-CM | POA: Diagnosis not present

## 2021-07-12 DIAGNOSIS — M9903 Segmental and somatic dysfunction of lumbar region: Secondary | ICD-10-CM | POA: Diagnosis not present

## 2021-07-12 DIAGNOSIS — M5134 Other intervertebral disc degeneration, thoracic region: Secondary | ICD-10-CM | POA: Diagnosis not present

## 2021-07-12 DIAGNOSIS — M9901 Segmental and somatic dysfunction of cervical region: Secondary | ICD-10-CM | POA: Diagnosis not present

## 2021-07-12 DIAGNOSIS — M9902 Segmental and somatic dysfunction of thoracic region: Secondary | ICD-10-CM | POA: Diagnosis not present

## 2021-07-12 DIAGNOSIS — M5032 Other cervical disc degeneration, mid-cervical region, unspecified level: Secondary | ICD-10-CM | POA: Diagnosis not present

## 2021-07-12 DIAGNOSIS — M9904 Segmental and somatic dysfunction of sacral region: Secondary | ICD-10-CM | POA: Diagnosis not present

## 2021-07-22 DIAGNOSIS — M9901 Segmental and somatic dysfunction of cervical region: Secondary | ICD-10-CM | POA: Diagnosis not present

## 2021-07-22 DIAGNOSIS — Z882 Allergy status to sulfonamides status: Secondary | ICD-10-CM | POA: Diagnosis not present

## 2021-07-22 DIAGNOSIS — M5137 Other intervertebral disc degeneration, lumbosacral region: Secondary | ICD-10-CM | POA: Diagnosis not present

## 2021-07-22 DIAGNOSIS — M9902 Segmental and somatic dysfunction of thoracic region: Secondary | ICD-10-CM | POA: Diagnosis not present

## 2021-07-22 DIAGNOSIS — Z7951 Long term (current) use of inhaled steroids: Secondary | ICD-10-CM | POA: Diagnosis not present

## 2021-07-22 DIAGNOSIS — M5136 Other intervertebral disc degeneration, lumbar region: Secondary | ICD-10-CM | POA: Diagnosis not present

## 2021-07-22 DIAGNOSIS — M5032 Other cervical disc degeneration, mid-cervical region, unspecified level: Secondary | ICD-10-CM | POA: Diagnosis not present

## 2021-07-22 DIAGNOSIS — F4323 Adjustment disorder with mixed anxiety and depressed mood: Secondary | ICD-10-CM | POA: Diagnosis not present

## 2021-07-22 DIAGNOSIS — G43909 Migraine, unspecified, not intractable, without status migrainosus: Secondary | ICD-10-CM | POA: Diagnosis not present

## 2021-07-22 DIAGNOSIS — Z803 Family history of malignant neoplasm of breast: Secondary | ICD-10-CM | POA: Diagnosis not present

## 2021-07-22 DIAGNOSIS — K52839 Microscopic colitis, unspecified: Secondary | ICD-10-CM | POA: Diagnosis not present

## 2021-07-22 DIAGNOSIS — M5134 Other intervertebral disc degeneration, thoracic region: Secondary | ICD-10-CM | POA: Diagnosis not present

## 2021-07-22 DIAGNOSIS — M9903 Segmental and somatic dysfunction of lumbar region: Secondary | ICD-10-CM | POA: Diagnosis not present

## 2021-07-22 DIAGNOSIS — Z8379 Family history of other diseases of the digestive system: Secondary | ICD-10-CM | POA: Diagnosis not present

## 2021-07-22 DIAGNOSIS — Z85828 Personal history of other malignant neoplasm of skin: Secondary | ICD-10-CM | POA: Diagnosis not present

## 2021-07-22 DIAGNOSIS — R69 Illness, unspecified: Secondary | ICD-10-CM | POA: Diagnosis not present

## 2021-07-22 DIAGNOSIS — M9904 Segmental and somatic dysfunction of sacral region: Secondary | ICD-10-CM | POA: Diagnosis not present

## 2021-07-29 DIAGNOSIS — L570 Actinic keratosis: Secondary | ICD-10-CM | POA: Diagnosis not present

## 2021-08-24 DIAGNOSIS — H2513 Age-related nuclear cataract, bilateral: Secondary | ICD-10-CM | POA: Diagnosis not present

## 2021-08-24 DIAGNOSIS — H5213 Myopia, bilateral: Secondary | ICD-10-CM | POA: Diagnosis not present

## 2021-08-24 DIAGNOSIS — H40013 Open angle with borderline findings, low risk, bilateral: Secondary | ICD-10-CM | POA: Diagnosis not present

## 2021-08-24 DIAGNOSIS — H524 Presbyopia: Secondary | ICD-10-CM | POA: Diagnosis not present

## 2021-09-20 DIAGNOSIS — M5134 Other intervertebral disc degeneration, thoracic region: Secondary | ICD-10-CM | POA: Diagnosis not present

## 2021-09-20 DIAGNOSIS — M5032 Other cervical disc degeneration, mid-cervical region, unspecified level: Secondary | ICD-10-CM | POA: Diagnosis not present

## 2021-09-20 DIAGNOSIS — M9904 Segmental and somatic dysfunction of sacral region: Secondary | ICD-10-CM | POA: Diagnosis not present

## 2021-09-20 DIAGNOSIS — M9902 Segmental and somatic dysfunction of thoracic region: Secondary | ICD-10-CM | POA: Diagnosis not present

## 2021-09-20 DIAGNOSIS — M9901 Segmental and somatic dysfunction of cervical region: Secondary | ICD-10-CM | POA: Diagnosis not present

## 2021-09-20 DIAGNOSIS — M5137 Other intervertebral disc degeneration, lumbosacral region: Secondary | ICD-10-CM | POA: Diagnosis not present

## 2021-09-20 DIAGNOSIS — M9903 Segmental and somatic dysfunction of lumbar region: Secondary | ICD-10-CM | POA: Diagnosis not present

## 2021-09-20 DIAGNOSIS — M5136 Other intervertebral disc degeneration, lumbar region: Secondary | ICD-10-CM | POA: Diagnosis not present

## 2021-10-01 DIAGNOSIS — K219 Gastro-esophageal reflux disease without esophagitis: Secondary | ICD-10-CM | POA: Diagnosis not present

## 2021-10-01 DIAGNOSIS — M8580 Other specified disorders of bone density and structure, unspecified site: Secondary | ICD-10-CM | POA: Diagnosis not present

## 2021-10-01 DIAGNOSIS — F4321 Adjustment disorder with depressed mood: Secondary | ICD-10-CM | POA: Diagnosis not present

## 2021-10-01 DIAGNOSIS — R69 Illness, unspecified: Secondary | ICD-10-CM | POA: Diagnosis not present

## 2021-10-01 DIAGNOSIS — N183 Chronic kidney disease, stage 3 unspecified: Secondary | ICD-10-CM | POA: Diagnosis not present

## 2021-10-01 DIAGNOSIS — F329 Major depressive disorder, single episode, unspecified: Secondary | ICD-10-CM | POA: Diagnosis not present

## 2021-10-01 DIAGNOSIS — G43009 Migraine without aura, not intractable, without status migrainosus: Secondary | ICD-10-CM | POA: Diagnosis not present

## 2021-10-05 ENCOUNTER — Other Ambulatory Visit: Payer: Self-pay | Admitting: Family Medicine

## 2021-10-11 ENCOUNTER — Other Ambulatory Visit: Payer: Self-pay | Admitting: Family Medicine

## 2021-10-11 DIAGNOSIS — M8580 Other specified disorders of bone density and structure, unspecified site: Secondary | ICD-10-CM

## 2021-11-05 DIAGNOSIS — M5134 Other intervertebral disc degeneration, thoracic region: Secondary | ICD-10-CM | POA: Diagnosis not present

## 2021-11-05 DIAGNOSIS — M5032 Other cervical disc degeneration, mid-cervical region, unspecified level: Secondary | ICD-10-CM | POA: Diagnosis not present

## 2021-11-05 DIAGNOSIS — M5137 Other intervertebral disc degeneration, lumbosacral region: Secondary | ICD-10-CM | POA: Diagnosis not present

## 2021-11-05 DIAGNOSIS — M5136 Other intervertebral disc degeneration, lumbar region: Secondary | ICD-10-CM | POA: Diagnosis not present

## 2021-11-05 DIAGNOSIS — M9902 Segmental and somatic dysfunction of thoracic region: Secondary | ICD-10-CM | POA: Diagnosis not present

## 2021-11-05 DIAGNOSIS — M9904 Segmental and somatic dysfunction of sacral region: Secondary | ICD-10-CM | POA: Diagnosis not present

## 2021-11-05 DIAGNOSIS — M9903 Segmental and somatic dysfunction of lumbar region: Secondary | ICD-10-CM | POA: Diagnosis not present

## 2021-11-05 DIAGNOSIS — M9901 Segmental and somatic dysfunction of cervical region: Secondary | ICD-10-CM | POA: Diagnosis not present

## 2021-11-12 DIAGNOSIS — M9901 Segmental and somatic dysfunction of cervical region: Secondary | ICD-10-CM | POA: Diagnosis not present

## 2021-11-12 DIAGNOSIS — M9904 Segmental and somatic dysfunction of sacral region: Secondary | ICD-10-CM | POA: Diagnosis not present

## 2021-11-12 DIAGNOSIS — M5032 Other cervical disc degeneration, mid-cervical region, unspecified level: Secondary | ICD-10-CM | POA: Diagnosis not present

## 2021-11-12 DIAGNOSIS — M5137 Other intervertebral disc degeneration, lumbosacral region: Secondary | ICD-10-CM | POA: Diagnosis not present

## 2021-11-12 DIAGNOSIS — M9902 Segmental and somatic dysfunction of thoracic region: Secondary | ICD-10-CM | POA: Diagnosis not present

## 2021-11-12 DIAGNOSIS — M9903 Segmental and somatic dysfunction of lumbar region: Secondary | ICD-10-CM | POA: Diagnosis not present

## 2021-11-12 DIAGNOSIS — M5136 Other intervertebral disc degeneration, lumbar region: Secondary | ICD-10-CM | POA: Diagnosis not present

## 2021-11-12 DIAGNOSIS — M5134 Other intervertebral disc degeneration, thoracic region: Secondary | ICD-10-CM | POA: Diagnosis not present

## 2021-11-18 DIAGNOSIS — K52831 Collagenous colitis: Secondary | ICD-10-CM | POA: Diagnosis not present

## 2021-11-23 DIAGNOSIS — F329 Major depressive disorder, single episode, unspecified: Secondary | ICD-10-CM | POA: Diagnosis not present

## 2021-11-23 DIAGNOSIS — F419 Anxiety disorder, unspecified: Secondary | ICD-10-CM | POA: Diagnosis not present

## 2021-11-23 DIAGNOSIS — F4321 Adjustment disorder with depressed mood: Secondary | ICD-10-CM | POA: Diagnosis not present

## 2021-11-23 DIAGNOSIS — R69 Illness, unspecified: Secondary | ICD-10-CM | POA: Diagnosis not present

## 2021-12-07 DIAGNOSIS — L814 Other melanin hyperpigmentation: Secondary | ICD-10-CM | POA: Diagnosis not present

## 2021-12-07 DIAGNOSIS — Z85828 Personal history of other malignant neoplasm of skin: Secondary | ICD-10-CM | POA: Diagnosis not present

## 2021-12-07 DIAGNOSIS — L821 Other seborrheic keratosis: Secondary | ICD-10-CM | POA: Diagnosis not present

## 2021-12-07 DIAGNOSIS — L570 Actinic keratosis: Secondary | ICD-10-CM | POA: Diagnosis not present

## 2021-12-07 DIAGNOSIS — D225 Melanocytic nevi of trunk: Secondary | ICD-10-CM | POA: Diagnosis not present

## 2021-12-08 DIAGNOSIS — M546 Pain in thoracic spine: Secondary | ICD-10-CM | POA: Diagnosis not present

## 2021-12-08 DIAGNOSIS — R69 Illness, unspecified: Secondary | ICD-10-CM | POA: Diagnosis not present

## 2021-12-24 DIAGNOSIS — M5136 Other intervertebral disc degeneration, lumbar region: Secondary | ICD-10-CM | POA: Diagnosis not present

## 2021-12-24 DIAGNOSIS — M9901 Segmental and somatic dysfunction of cervical region: Secondary | ICD-10-CM | POA: Diagnosis not present

## 2021-12-24 DIAGNOSIS — M5032 Other cervical disc degeneration, mid-cervical region, unspecified level: Secondary | ICD-10-CM | POA: Diagnosis not present

## 2021-12-24 DIAGNOSIS — M5137 Other intervertebral disc degeneration, lumbosacral region: Secondary | ICD-10-CM | POA: Diagnosis not present

## 2021-12-24 DIAGNOSIS — M9904 Segmental and somatic dysfunction of sacral region: Secondary | ICD-10-CM | POA: Diagnosis not present

## 2021-12-24 DIAGNOSIS — M5134 Other intervertebral disc degeneration, thoracic region: Secondary | ICD-10-CM | POA: Diagnosis not present

## 2021-12-24 DIAGNOSIS — M9903 Segmental and somatic dysfunction of lumbar region: Secondary | ICD-10-CM | POA: Diagnosis not present

## 2021-12-24 DIAGNOSIS — M9902 Segmental and somatic dysfunction of thoracic region: Secondary | ICD-10-CM | POA: Diagnosis not present

## 2022-01-05 DIAGNOSIS — F419 Anxiety disorder, unspecified: Secondary | ICD-10-CM | POA: Diagnosis not present

## 2022-01-05 DIAGNOSIS — F329 Major depressive disorder, single episode, unspecified: Secondary | ICD-10-CM | POA: Diagnosis not present

## 2022-01-05 DIAGNOSIS — R69 Illness, unspecified: Secondary | ICD-10-CM | POA: Diagnosis not present

## 2022-01-06 DIAGNOSIS — Z780 Asymptomatic menopausal state: Secondary | ICD-10-CM | POA: Diagnosis not present

## 2022-01-06 DIAGNOSIS — M85852 Other specified disorders of bone density and structure, left thigh: Secondary | ICD-10-CM | POA: Diagnosis not present

## 2022-02-02 DIAGNOSIS — G43909 Migraine, unspecified, not intractable, without status migrainosus: Secondary | ICD-10-CM | POA: Diagnosis not present

## 2022-02-02 DIAGNOSIS — Z23 Encounter for immunization: Secondary | ICD-10-CM | POA: Diagnosis not present

## 2022-02-02 DIAGNOSIS — R69 Illness, unspecified: Secondary | ICD-10-CM | POA: Diagnosis not present

## 2022-02-02 DIAGNOSIS — F4321 Adjustment disorder with depressed mood: Secondary | ICD-10-CM | POA: Diagnosis not present

## 2022-02-02 DIAGNOSIS — N183 Chronic kidney disease, stage 3 unspecified: Secondary | ICD-10-CM | POA: Diagnosis not present

## 2022-02-02 DIAGNOSIS — M8580 Other specified disorders of bone density and structure, unspecified site: Secondary | ICD-10-CM | POA: Diagnosis not present

## 2022-02-02 DIAGNOSIS — Z0000 Encounter for general adult medical examination without abnormal findings: Secondary | ICD-10-CM | POA: Diagnosis not present

## 2022-02-02 DIAGNOSIS — Z136 Encounter for screening for cardiovascular disorders: Secondary | ICD-10-CM | POA: Diagnosis not present

## 2022-02-02 DIAGNOSIS — K52831 Collagenous colitis: Secondary | ICD-10-CM | POA: Diagnosis not present

## 2022-02-04 DIAGNOSIS — M5134 Other intervertebral disc degeneration, thoracic region: Secondary | ICD-10-CM | POA: Diagnosis not present

## 2022-02-04 DIAGNOSIS — M9902 Segmental and somatic dysfunction of thoracic region: Secondary | ICD-10-CM | POA: Diagnosis not present

## 2022-02-04 DIAGNOSIS — M5136 Other intervertebral disc degeneration, lumbar region: Secondary | ICD-10-CM | POA: Diagnosis not present

## 2022-02-04 DIAGNOSIS — M9904 Segmental and somatic dysfunction of sacral region: Secondary | ICD-10-CM | POA: Diagnosis not present

## 2022-02-04 DIAGNOSIS — M9901 Segmental and somatic dysfunction of cervical region: Secondary | ICD-10-CM | POA: Diagnosis not present

## 2022-02-04 DIAGNOSIS — M5137 Other intervertebral disc degeneration, lumbosacral region: Secondary | ICD-10-CM | POA: Diagnosis not present

## 2022-02-04 DIAGNOSIS — M5032 Other cervical disc degeneration, mid-cervical region, unspecified level: Secondary | ICD-10-CM | POA: Diagnosis not present

## 2022-02-04 DIAGNOSIS — M9903 Segmental and somatic dysfunction of lumbar region: Secondary | ICD-10-CM | POA: Diagnosis not present

## 2022-02-09 ENCOUNTER — Other Ambulatory Visit: Payer: Self-pay | Admitting: Family Medicine

## 2022-02-09 DIAGNOSIS — E7800 Pure hypercholesterolemia, unspecified: Secondary | ICD-10-CM

## 2022-02-22 DIAGNOSIS — H5213 Myopia, bilateral: Secondary | ICD-10-CM | POA: Diagnosis not present

## 2022-02-22 DIAGNOSIS — H2513 Age-related nuclear cataract, bilateral: Secondary | ICD-10-CM | POA: Diagnosis not present

## 2022-03-02 DIAGNOSIS — H25812 Combined forms of age-related cataract, left eye: Secondary | ICD-10-CM | POA: Diagnosis not present

## 2022-03-02 DIAGNOSIS — H2512 Age-related nuclear cataract, left eye: Secondary | ICD-10-CM | POA: Diagnosis not present

## 2022-03-15 DIAGNOSIS — T1512XA Foreign body in conjunctival sac, left eye, initial encounter: Secondary | ICD-10-CM | POA: Diagnosis not present

## 2022-03-15 DIAGNOSIS — Z961 Presence of intraocular lens: Secondary | ICD-10-CM | POA: Diagnosis not present

## 2022-03-15 DIAGNOSIS — H04123 Dry eye syndrome of bilateral lacrimal glands: Secondary | ICD-10-CM | POA: Diagnosis not present

## 2022-03-17 DIAGNOSIS — H0011 Chalazion right upper eyelid: Secondary | ICD-10-CM | POA: Diagnosis not present

## 2022-03-30 DIAGNOSIS — Z1231 Encounter for screening mammogram for malignant neoplasm of breast: Secondary | ICD-10-CM | POA: Diagnosis not present

## 2022-04-04 ENCOUNTER — Telehealth: Payer: Self-pay | Admitting: *Deleted

## 2022-04-04 NOTE — Telephone Encounter (Signed)
Returned call from 2:08 PM. Left patient a message to call and schedule annual with Dr. Gala Romney after 05/19/2022. First available is 05/23/2022. ?

## 2022-04-07 DIAGNOSIS — H0011 Chalazion right upper eyelid: Secondary | ICD-10-CM | POA: Diagnosis not present

## 2022-04-13 DIAGNOSIS — H25811 Combined forms of age-related cataract, right eye: Secondary | ICD-10-CM | POA: Diagnosis not present

## 2022-04-13 DIAGNOSIS — H2511 Age-related nuclear cataract, right eye: Secondary | ICD-10-CM | POA: Diagnosis not present

## 2022-04-22 DIAGNOSIS — M5032 Other cervical disc degeneration, mid-cervical region, unspecified level: Secondary | ICD-10-CM | POA: Diagnosis not present

## 2022-04-22 DIAGNOSIS — M9901 Segmental and somatic dysfunction of cervical region: Secondary | ICD-10-CM | POA: Diagnosis not present

## 2022-04-22 DIAGNOSIS — M9904 Segmental and somatic dysfunction of sacral region: Secondary | ICD-10-CM | POA: Diagnosis not present

## 2022-04-22 DIAGNOSIS — M5137 Other intervertebral disc degeneration, lumbosacral region: Secondary | ICD-10-CM | POA: Diagnosis not present

## 2022-04-22 DIAGNOSIS — M5134 Other intervertebral disc degeneration, thoracic region: Secondary | ICD-10-CM | POA: Diagnosis not present

## 2022-04-22 DIAGNOSIS — M9902 Segmental and somatic dysfunction of thoracic region: Secondary | ICD-10-CM | POA: Diagnosis not present

## 2022-04-22 DIAGNOSIS — M9903 Segmental and somatic dysfunction of lumbar region: Secondary | ICD-10-CM | POA: Diagnosis not present

## 2022-04-22 DIAGNOSIS — M5136 Other intervertebral disc degeneration, lumbar region: Secondary | ICD-10-CM | POA: Diagnosis not present

## 2022-05-02 DIAGNOSIS — M5032 Other cervical disc degeneration, mid-cervical region, unspecified level: Secondary | ICD-10-CM | POA: Diagnosis not present

## 2022-05-02 DIAGNOSIS — M9901 Segmental and somatic dysfunction of cervical region: Secondary | ICD-10-CM | POA: Diagnosis not present

## 2022-05-02 DIAGNOSIS — M9904 Segmental and somatic dysfunction of sacral region: Secondary | ICD-10-CM | POA: Diagnosis not present

## 2022-05-02 DIAGNOSIS — M5137 Other intervertebral disc degeneration, lumbosacral region: Secondary | ICD-10-CM | POA: Diagnosis not present

## 2022-05-02 DIAGNOSIS — M9903 Segmental and somatic dysfunction of lumbar region: Secondary | ICD-10-CM | POA: Diagnosis not present

## 2022-05-02 DIAGNOSIS — M5136 Other intervertebral disc degeneration, lumbar region: Secondary | ICD-10-CM | POA: Diagnosis not present

## 2022-05-02 DIAGNOSIS — M9902 Segmental and somatic dysfunction of thoracic region: Secondary | ICD-10-CM | POA: Diagnosis not present

## 2022-05-02 DIAGNOSIS — M5134 Other intervertebral disc degeneration, thoracic region: Secondary | ICD-10-CM | POA: Diagnosis not present

## 2022-05-30 ENCOUNTER — Ambulatory Visit (INDEPENDENT_AMBULATORY_CARE_PROVIDER_SITE_OTHER): Payer: Medicare HMO | Admitting: Obstetrics & Gynecology

## 2022-05-30 ENCOUNTER — Encounter: Payer: Self-pay | Admitting: Obstetrics & Gynecology

## 2022-05-30 ENCOUNTER — Other Ambulatory Visit (HOSPITAL_COMMUNITY)
Admission: RE | Admit: 2022-05-30 | Discharge: 2022-05-30 | Disposition: A | Discharge status: Home / Self Care | Payer: Medicare HMO | Source: Ambulatory Visit | Attending: Obstetrics & Gynecology | Admitting: Obstetrics & Gynecology

## 2022-05-30 VITALS — BP 117/69 | HR 75 | Ht 67.0 in | Wt 122.0 lb

## 2022-05-30 DIAGNOSIS — Z803 Family history of malignant neoplasm of breast: Secondary | ICD-10-CM | POA: Diagnosis not present

## 2022-05-30 DIAGNOSIS — N952 Postmenopausal atrophic vaginitis: Secondary | ICD-10-CM | POA: Diagnosis not present

## 2022-05-30 DIAGNOSIS — K582 Mixed irritable bowel syndrome: Secondary | ICD-10-CM

## 2022-05-30 DIAGNOSIS — Z01419 Encounter for gynecological examination (general) (routine) without abnormal findings: Secondary | ICD-10-CM

## 2022-05-30 DIAGNOSIS — R69 Illness, unspecified: Secondary | ICD-10-CM | POA: Diagnosis not present

## 2022-05-30 DIAGNOSIS — M8580 Other specified disorders of bone density and structure, unspecified site: Secondary | ICD-10-CM

## 2022-05-30 DIAGNOSIS — Z1151 Encounter for screening for human papillomavirus (HPV): Secondary | ICD-10-CM | POA: Diagnosis not present

## 2022-05-30 NOTE — Progress Notes (Signed)
Subjective:     Mary Freeman is a 71 y.o. female here for a routine exam.  Current complaints: still having bowl issues.  Pt had Dexa by PCP and showed osteopenia--per patient.  No pMB.  Would like genetic testing for BRCA.   Gynecologic History No LMP recorded. Patient is postmenopausal. Contraception: post menopausal status Last Mammogram: May 2023 Last Pap Smear:  09/13/12 Last Colon Screening;  2014 Seat Belts:   yes Sun Screen:   yes Dental Check Up:  yes Brush & Floss:  yes  Obstetric History OB History  Gravida Para Term Preterm AB Living  $Remov'3 3 3        'xdMDVS$ SAB IAB Ectopic Multiple Live Births          3    # Outcome Date GA Lbr Len/2nd Weight Sex Delivery Anes PTL Lv  3 Term           2 Term           1 Term              The following portions of the patient's history were reviewed and updated as appropriate: allergies, current medications, past family history, past medical history, past social history, past surgical history, and problem list.  Review of Systems Pertinent items noted in HPI and remainder of comprehensive ROS otherwise negative.    Objective:     Vitals:   05/30/22 1309  BP: 117/69  Pulse: 75  Weight: 122 lb (55.3 kg)  Height: $Remove'5\' 7"'eSBHQWZ$  (1.702 m)   Vitals:  WNL General appearance: alert, cooperative and no distress  HEENT: Normocephalic, without obvious abnormality, atraumatic Eyes: negative Throat: lips, mucosa, and tongue normal; teeth and gums normal  Respiratory: Clear to auscultation bilaterally  CV: Regular rate and rhythm  Breasts:  Normal appearance, no masses or tenderness, no nipple retraction or dimpling  GI: Soft, non-tender; bowel sounds normal; no masses,  no organomegaly  GU: External Genitalia:  Tanner V, no lesion Urethra:  No prolapse   Vagina: Pink, normal rugae, no blood or discharge  Cervix: No CMT, no lesion  Uterus:  Normal size and contour, non tender  Adnexa: Normal, no masses, non tender  Musculoskeletal: No edema,  redness or tenderness in the calves or thighs  Skin: No lesions or rash  Lymphatic: Axillary adenopathy: none     Psychiatric: Normal mood and behavior        Assessment:    Healthy female exam.    Plan:    2 sisters with breast cancer.  One passed away.  Pt desires genetic testing.  Lost daughter to alcoholism this past year Last pap is 2013 (age 5)  Will do a pap with cotesting today Yearly Mammograms Colonoscopy due next year.   I provided 30 minutes of verbal and non-verbal time during this encounter date, time was needed to gather information, review chart, records, communicate/coordinate with staff, perform the history & physician, counseling as well as complete documentation.

## 2022-05-30 NOTE — Progress Notes (Signed)
Last Mammogram: May 2023 Last Pap Smear:  09/13/12 Last Colon Screening;  2014 Seat Belts:   yes Sun Screen:   yes Dental Check Up:  yes Brush & Floss:  yes

## 2022-06-03 LAB — CYTOLOGY - PAP
Comment: NEGATIVE
Diagnosis: NEGATIVE
High risk HPV: NEGATIVE

## 2022-06-07 DIAGNOSIS — L821 Other seborrheic keratosis: Secondary | ICD-10-CM | POA: Diagnosis not present

## 2022-06-07 DIAGNOSIS — C44629 Squamous cell carcinoma of skin of left upper limb, including shoulder: Secondary | ICD-10-CM | POA: Diagnosis not present

## 2022-06-07 DIAGNOSIS — L570 Actinic keratosis: Secondary | ICD-10-CM | POA: Diagnosis not present

## 2022-06-07 DIAGNOSIS — D485 Neoplasm of uncertain behavior of skin: Secondary | ICD-10-CM | POA: Diagnosis not present

## 2022-06-07 DIAGNOSIS — C44722 Squamous cell carcinoma of skin of right lower limb, including hip: Secondary | ICD-10-CM | POA: Diagnosis not present

## 2022-07-05 DIAGNOSIS — M5032 Other cervical disc degeneration, mid-cervical region, unspecified level: Secondary | ICD-10-CM | POA: Diagnosis not present

## 2022-07-05 DIAGNOSIS — M9903 Segmental and somatic dysfunction of lumbar region: Secondary | ICD-10-CM | POA: Diagnosis not present

## 2022-07-05 DIAGNOSIS — M5136 Other intervertebral disc degeneration, lumbar region: Secondary | ICD-10-CM | POA: Diagnosis not present

## 2022-07-05 DIAGNOSIS — M5137 Other intervertebral disc degeneration, lumbosacral region: Secondary | ICD-10-CM | POA: Diagnosis not present

## 2022-07-05 DIAGNOSIS — M9902 Segmental and somatic dysfunction of thoracic region: Secondary | ICD-10-CM | POA: Diagnosis not present

## 2022-07-05 DIAGNOSIS — M5134 Other intervertebral disc degeneration, thoracic region: Secondary | ICD-10-CM | POA: Diagnosis not present

## 2022-07-05 DIAGNOSIS — M9904 Segmental and somatic dysfunction of sacral region: Secondary | ICD-10-CM | POA: Diagnosis not present

## 2022-07-05 DIAGNOSIS — M9901 Segmental and somatic dysfunction of cervical region: Secondary | ICD-10-CM | POA: Diagnosis not present

## 2022-07-21 DIAGNOSIS — D485 Neoplasm of uncertain behavior of skin: Secondary | ICD-10-CM | POA: Diagnosis not present

## 2022-07-21 DIAGNOSIS — C44729 Squamous cell carcinoma of skin of left lower limb, including hip: Secondary | ICD-10-CM | POA: Diagnosis not present

## 2022-08-16 DIAGNOSIS — R69 Illness, unspecified: Secondary | ICD-10-CM | POA: Diagnosis not present

## 2022-08-16 DIAGNOSIS — G43009 Migraine without aura, not intractable, without status migrainosus: Secondary | ICD-10-CM | POA: Diagnosis not present

## 2022-08-19 DIAGNOSIS — M5137 Other intervertebral disc degeneration, lumbosacral region: Secondary | ICD-10-CM | POA: Diagnosis not present

## 2022-08-19 DIAGNOSIS — M9904 Segmental and somatic dysfunction of sacral region: Secondary | ICD-10-CM | POA: Diagnosis not present

## 2022-08-19 DIAGNOSIS — M9901 Segmental and somatic dysfunction of cervical region: Secondary | ICD-10-CM | POA: Diagnosis not present

## 2022-08-19 DIAGNOSIS — M5032 Other cervical disc degeneration, mid-cervical region, unspecified level: Secondary | ICD-10-CM | POA: Diagnosis not present

## 2022-08-19 DIAGNOSIS — M9902 Segmental and somatic dysfunction of thoracic region: Secondary | ICD-10-CM | POA: Diagnosis not present

## 2022-08-19 DIAGNOSIS — M9903 Segmental and somatic dysfunction of lumbar region: Secondary | ICD-10-CM | POA: Diagnosis not present

## 2022-08-19 DIAGNOSIS — M5134 Other intervertebral disc degeneration, thoracic region: Secondary | ICD-10-CM | POA: Diagnosis not present

## 2022-08-19 DIAGNOSIS — M5136 Other intervertebral disc degeneration, lumbar region: Secondary | ICD-10-CM | POA: Diagnosis not present

## 2022-09-27 DIAGNOSIS — I83893 Varicose veins of bilateral lower extremities with other complications: Secondary | ICD-10-CM | POA: Diagnosis not present

## 2022-09-27 DIAGNOSIS — R600 Localized edema: Secondary | ICD-10-CM | POA: Diagnosis not present

## 2022-09-27 DIAGNOSIS — I872 Venous insufficiency (chronic) (peripheral): Secondary | ICD-10-CM | POA: Diagnosis not present

## 2022-10-04 DIAGNOSIS — M5032 Other cervical disc degeneration, mid-cervical region, unspecified level: Secondary | ICD-10-CM | POA: Diagnosis not present

## 2022-10-04 DIAGNOSIS — M5137 Other intervertebral disc degeneration, lumbosacral region: Secondary | ICD-10-CM | POA: Diagnosis not present

## 2022-10-04 DIAGNOSIS — M5136 Other intervertebral disc degeneration, lumbar region: Secondary | ICD-10-CM | POA: Diagnosis not present

## 2022-10-04 DIAGNOSIS — M9903 Segmental and somatic dysfunction of lumbar region: Secondary | ICD-10-CM | POA: Diagnosis not present

## 2022-10-04 DIAGNOSIS — M9902 Segmental and somatic dysfunction of thoracic region: Secondary | ICD-10-CM | POA: Diagnosis not present

## 2022-10-04 DIAGNOSIS — M9904 Segmental and somatic dysfunction of sacral region: Secondary | ICD-10-CM | POA: Diagnosis not present

## 2022-10-04 DIAGNOSIS — M5134 Other intervertebral disc degeneration, thoracic region: Secondary | ICD-10-CM | POA: Diagnosis not present

## 2022-10-04 DIAGNOSIS — M9901 Segmental and somatic dysfunction of cervical region: Secondary | ICD-10-CM | POA: Diagnosis not present

## 2022-11-15 DIAGNOSIS — H40013 Open angle with borderline findings, low risk, bilateral: Secondary | ICD-10-CM | POA: Diagnosis not present

## 2022-11-15 DIAGNOSIS — H52203 Unspecified astigmatism, bilateral: Secondary | ICD-10-CM | POA: Diagnosis not present

## 2022-12-07 DIAGNOSIS — D485 Neoplasm of uncertain behavior of skin: Secondary | ICD-10-CM | POA: Diagnosis not present

## 2022-12-07 DIAGNOSIS — Z85828 Personal history of other malignant neoplasm of skin: Secondary | ICD-10-CM | POA: Diagnosis not present

## 2022-12-07 DIAGNOSIS — L570 Actinic keratosis: Secondary | ICD-10-CM | POA: Diagnosis not present

## 2022-12-07 DIAGNOSIS — C44722 Squamous cell carcinoma of skin of right lower limb, including hip: Secondary | ICD-10-CM | POA: Diagnosis not present

## 2022-12-07 DIAGNOSIS — L821 Other seborrheic keratosis: Secondary | ICD-10-CM | POA: Diagnosis not present

## 2022-12-07 DIAGNOSIS — D225 Melanocytic nevi of trunk: Secondary | ICD-10-CM | POA: Diagnosis not present

## 2023-01-13 DIAGNOSIS — M5137 Other intervertebral disc degeneration, lumbosacral region: Secondary | ICD-10-CM | POA: Diagnosis not present

## 2023-01-13 DIAGNOSIS — M9902 Segmental and somatic dysfunction of thoracic region: Secondary | ICD-10-CM | POA: Diagnosis not present

## 2023-01-13 DIAGNOSIS — M5134 Other intervertebral disc degeneration, thoracic region: Secondary | ICD-10-CM | POA: Diagnosis not present

## 2023-01-13 DIAGNOSIS — M9904 Segmental and somatic dysfunction of sacral region: Secondary | ICD-10-CM | POA: Diagnosis not present

## 2023-01-13 DIAGNOSIS — M5032 Other cervical disc degeneration, mid-cervical region, unspecified level: Secondary | ICD-10-CM | POA: Diagnosis not present

## 2023-01-13 DIAGNOSIS — M5136 Other intervertebral disc degeneration, lumbar region: Secondary | ICD-10-CM | POA: Diagnosis not present

## 2023-01-13 DIAGNOSIS — M9901 Segmental and somatic dysfunction of cervical region: Secondary | ICD-10-CM | POA: Diagnosis not present

## 2023-01-13 DIAGNOSIS — M9903 Segmental and somatic dysfunction of lumbar region: Secondary | ICD-10-CM | POA: Diagnosis not present

## 2023-01-25 DIAGNOSIS — M5032 Other cervical disc degeneration, mid-cervical region, unspecified level: Secondary | ICD-10-CM | POA: Diagnosis not present

## 2023-01-25 DIAGNOSIS — M9902 Segmental and somatic dysfunction of thoracic region: Secondary | ICD-10-CM | POA: Diagnosis not present

## 2023-01-25 DIAGNOSIS — M5136 Other intervertebral disc degeneration, lumbar region: Secondary | ICD-10-CM | POA: Diagnosis not present

## 2023-01-25 DIAGNOSIS — M5134 Other intervertebral disc degeneration, thoracic region: Secondary | ICD-10-CM | POA: Diagnosis not present

## 2023-01-25 DIAGNOSIS — M5137 Other intervertebral disc degeneration, lumbosacral region: Secondary | ICD-10-CM | POA: Diagnosis not present

## 2023-01-25 DIAGNOSIS — M9904 Segmental and somatic dysfunction of sacral region: Secondary | ICD-10-CM | POA: Diagnosis not present

## 2023-01-25 DIAGNOSIS — M9901 Segmental and somatic dysfunction of cervical region: Secondary | ICD-10-CM | POA: Diagnosis not present

## 2023-01-25 DIAGNOSIS — M9903 Segmental and somatic dysfunction of lumbar region: Secondary | ICD-10-CM | POA: Diagnosis not present

## 2023-01-30 ENCOUNTER — Ambulatory Visit
Admission: EM | Admit: 2023-01-30 | Discharge: 2023-01-30 | Disposition: A | Discharge status: Home / Self Care | Payer: Medicare HMO

## 2023-01-30 ENCOUNTER — Ambulatory Visit (INDEPENDENT_AMBULATORY_CARE_PROVIDER_SITE_OTHER): Payer: Medicare HMO

## 2023-01-30 DIAGNOSIS — S0083XA Contusion of other part of head, initial encounter: Secondary | ICD-10-CM | POA: Diagnosis not present

## 2023-01-30 DIAGNOSIS — S6991XA Unspecified injury of right wrist, hand and finger(s), initial encounter: Secondary | ICD-10-CM

## 2023-01-30 DIAGNOSIS — M79644 Pain in right finger(s): Secondary | ICD-10-CM

## 2023-01-30 DIAGNOSIS — M7989 Other specified soft tissue disorders: Secondary | ICD-10-CM | POA: Diagnosis not present

## 2023-01-30 NOTE — ED Triage Notes (Signed)
Pt c/o fall that occurred in her driveway Saturday afternoon. Bruising to chin. And injury to RT middle finger. Pain 8/10 Currently has splint on it. Ice and tylenol prn.

## 2023-01-30 NOTE — Discharge Instructions (Addendum)
Your xray does not show evidence of fracture. Given your decreased range of motion however, I am concerned about a tendon injury. Please continue to wear the finger splint. 650-'1000mg'$  tylenol every 8 hours as needed for pain. Ice the finger. Please follow up with orthopedics within the next week for recheck. You have a contusion to your chin. Continue ice and tylenol.

## 2023-01-30 NOTE — ED Provider Notes (Signed)
[name]    CSN: [id number] Arrival date & time: 01/30/23  0941      History   Chief Complaint Chief Complaint  Patient presents with   Fall   Facial Pain    Chin   Finger Injury    RT middle    HPI Mary Freeman is a 72 y.o. female.   Pleasant 72 year old female presents today due to concerns primarily of right middle finger injury.  She states on Saturday she was trying to pick weeds from her mulch in flip-flops.  She states the flip-flop got caught on the driveway and she fell forward.  She was unable to fully catch herself.  She did hit her chin on the driveway, and reports a contusion to this region now.  She denies any pain however.  She denies any additional head injury.  She denies any headache.  States she was able to eat fine and the Tylenol worked well.  Fully able to open jaw and no tooth or dental injury.  Her concern primarily is her right middle finger which she states she cannot fully flex at the PIP joint.  She reports it looks swollen and is uncomfortable.  She does not tolerate NSAIDs but states that Tylenol works well.  She put her finger in a metal splint and states this has also helped control the pain.  Denies sensation loss.  Skin fully intact.   Fall    Past Medical History:  Diagnosis Date   Depression    Headache    migraines   PONV (postoperative nausea and vomiting)     Patient Active Problem List   Diagnosis Date Noted   Anxiety 02/11/2021   Adjustment disorder with depressed mood 02/11/2021   Chronic kidney disease, stage 3 unspecified ([place]) 02/11/2021   Collagenous colitis 02/11/2021   Gastroesophageal reflux disease 02/11/2021   Migraine without aura, not refractory 02/11/2021   Mild depression 02/11/2021   Osteopenia 02/11/2021   Early satiety 01/12/2021   Bloating 01/12/2021   Abdominal pain 01/12/2021    Past Surgical History:  Procedure Laterality Date   APPENDECTOMY     BREAST EXCISIONAL BIOPSY Right     benign   COLONOSCOPY     KNEE ARTHROSCOPY Right    x3   KNEE ARTHROSCOPY W/ MENISCAL REPAIR Left    LUMBAR LAMINECTOMY  1986   REDUCTION MAMMAPLASTY Bilateral    2006   ROTATOR CUFF REPAIR Right 2012   TONSILLECTOMY     TUBAL LIGATION      OB History     Gravida  3   Para  3   Term  3   Preterm      AB      Living         SAB      IAB      Ectopic      Multiple      Live Births  3            Home Medications    Prior to Admission medications   Medication Sig Start Date End Date Taking? Authorizing Provider  FLUoxetine (PROZAC) 20 MG capsule Take 20 mg by mouth daily. 12/25/22  Yes [provider]  acetaminophen-codeine (TYLENOL #3) 300-30 MG tablet Take 1-2 tablets by mouth every 6 (six) hours as needed for moderate pain. Patient not taking: Reported on 05/30/2022 02/06/21   Raylene Everts, MD  Budesonide (ENTOCORT EC PO) Take by mouth.  [provider]  Calcium-Magnesium (CAL-MAG PO) Take 1 tablet by mouth daily.    [provider]  cholecalciferol (VITAMIN D) 1000 units tablet Take 1,000 Units by mouth daily.    [provider]  LORazepam (ATIVAN) 0.5 MG tablet Take 0.5 mg by mouth at bedtime as needed for sleep.    [provider]  Multiple Vitamins-Minerals (MULTIVITAMIN WITH MINERALS) tablet Take 1 tablet by mouth daily.    [provider]  rizatriptan (MAXALT) 10 MG tablet Take 10 mg by mouth as needed for migraine. May repeat in 2 hours if needed    [provider]    Family History Family History  Problem Relation Age of Onset   Cancer Father        lung   Cancer Sister        breast   Breast cancer Sister 40   Alcoholism Sister    Alcoholism Mother    Alcoholism Brother    Cancer Sister    Breast cancer Sister 1   Alcoholism Daughter    Alcoholism Daughter     Social History Social History   Tobacco Use   Smoking status: Never   Smokeless tobacco: Never  Vaping  Use   Vaping Use: Never used  Substance Use Topics   Alcohol use: Yes    Comment: very rare    Drug use: No     Allergies   Morphine and related, Amitriptyline, Dilaudid [hydromorphone], Diphenhydramine, Hydrocodone-acetaminophen, Pentazocine, Stadol [butorphanol], Sulfa antibiotics, Benadryl [diphenhydramine hcl], Flagyl [metronidazole], Prednisone, Solu-medrol [methylprednisolone], Butorphanol tartrate, Hydrocodone-acetaminophen, Hydromorphone hcl, Other, Oxycodone-acetaminophen, and Promethazine   Review of Systems Review of Systems As per HPI  Physical Exam Triage Vital Signs ED Triage Vitals [01/30/23 0954]  Enc Vitals Group     BP (!) 111/47     Pulse Rate 71     Resp 17     Temp 98.2 F (36.8 C)     Temp Source Oral     SpO2 99 %     Weight      Height      Head Circumference      Peak Flow      Pain Score 8     Pain Loc      Pain Edu?      Excl. in [place]?    No data found.  Updated Vital Signs BP (!) 111/47 (BP Location: Left Arm)   Pulse 71   Temp 98.2 F (36.8 C) (Oral)   Resp 17   SpO2 99%   Visual Acuity Right Eye Distance:   Left Eye Distance:   Bilateral Distance:    Right Eye Near:   Left Eye Near:    Bilateral Near:     Physical Exam Vitals and nursing note reviewed.  Constitutional:      General: She is not in acute distress.    Appearance: Normal appearance. She is normal weight. She is not ill-appearing or toxic-appearing.  HENT:     Head: Normocephalic. Contusion (mild ecchymosis to chin midline WITHOUT tenderness. No mass or warmth. FROM jaw) present. No raccoon eyes, abrasion, right periorbital erythema or laceration.     Jaw: There is normal jaw occlusion. No trismus, tenderness, swelling, pain on movement or malocclusion.     Salivary Glands: Right salivary gland is not diffusely enlarged or tender. Left salivary gland is not diffusely enlarged or tender.      Nose: Nose normal.     Mouth/Throat:  Mouth: Mucous membranes are  moist.  Eyes:     General:        Right eye: No discharge.        Left eye: No discharge.     Extraocular Movements: Extraocular movements intact.     Conjunctiva/sclera: Conjunctivae normal.     Pupils: Pupils are equal, round, and reactive to light.  Cardiovascular:     Rate and Rhythm: Normal rate.  Pulmonary:     Effort: Pulmonary effort is normal. No respiratory distress.  Musculoskeletal:        General: Swelling and tenderness (R middle finger PIP joint, swelling and mild bruising noted to proximal phalanax R middle finger) present. No deformity.     Cervical back: Normal range of motion and neck supple. No rigidity or tenderness.     Comments: Unable to flex R middle finger PIP joint.  Sensation intact. Cap refill intact DIP joint unaffected  Skin:    General: Skin is warm and dry.     Capillary Refill: Capillary refill takes less than 2 seconds.     Findings: No rash.  Neurological:     General: No focal deficit present.     Mental Status: She is alert and oriented to person, place, and time.     Sensory: No sensory deficit.     Motor: No weakness.      UC Treatments / Results  Labs (all labs ordered are listed, but only abnormal results are displayed) Labs Reviewed - No data to display  EKG   Radiology DG Finger Middle Right  Result Date: 01/30/2023 CLINICAL DATA:  Pain and swelling, fall EXAM: RIGHT MIDDLE FINGER 3V COMPARISON:  None Available. FINDINGS: No evidence of fracture or dislocation. Degenerative changes of the middle finger IP joints consisting of minimal osteophyte formation. Soft tissue swelling of the middle finger. IMPRESSION: Soft tissue swelling of the middle finger without evidence of fracture or dislocation. Electronically Signed   By: Yetta Glassman M.D.   On: 01/30/2023 10:26    Procedures Procedures (including critical care time)  Medications Ordered in UC Medications - No data to display  Initial Impression / Assessment and Plan /  UC Course  I have reviewed the triage vital signs and the nursing notes.  Pertinent labs & imaging results that were available during my care of the patient were reviewed by me and considered in my medical decision making (see chart for details).     Contusion chin -patient with no signs or symptoms of a jaw fracture.  No additional head trauma noted.  No indication for imaging, she has a mild bruise to this area which is responding well to Tylenol and ice. Right middle finger PIP joint injury -x-ray shows no signs of avulsion or fracture.  I do have concern for partial tendon rupture however given her decreased range of motion and discomfort.  Patient does not tolerate NSAIDs, she will continue with Tylenol.  She will wear her finger splint until follow-up with Ortho as discussed.   Final Clinical Impressions(s) / UC Diagnoses   Final diagnoses:  Contusion of face, initial encounter  Injury of finger of right hand, initial encounter     Discharge Instructions      Your xray does not show evidence of fracture. Given your decreased range of motion however, I am concerned about a tendon injury. Please continue to wear the finger splint. 650-'1000mg'$  tylenol every 8 hours as needed for pain. Ice the finger. Please follow up  with orthopedics within the next week for recheck. You have a contusion to your chin. Continue ice and tylenol.     ED Prescriptions   None    PDMP not reviewed this encounter.   Chaney Malling, [place] 01/30/23 1042

## 2023-02-03 DIAGNOSIS — S63632A Sprain of interphalangeal joint of right middle finger, initial encounter: Secondary | ICD-10-CM | POA: Diagnosis not present

## 2023-02-06 DIAGNOSIS — M9902 Segmental and somatic dysfunction of thoracic region: Secondary | ICD-10-CM | POA: Diagnosis not present

## 2023-02-06 DIAGNOSIS — M9904 Segmental and somatic dysfunction of sacral region: Secondary | ICD-10-CM | POA: Diagnosis not present

## 2023-02-06 DIAGNOSIS — M5032 Other cervical disc degeneration, mid-cervical region, unspecified level: Secondary | ICD-10-CM | POA: Diagnosis not present

## 2023-02-06 DIAGNOSIS — M9903 Segmental and somatic dysfunction of lumbar region: Secondary | ICD-10-CM | POA: Diagnosis not present

## 2023-02-06 DIAGNOSIS — M5134 Other intervertebral disc degeneration, thoracic region: Secondary | ICD-10-CM | POA: Diagnosis not present

## 2023-02-06 DIAGNOSIS — M5136 Other intervertebral disc degeneration, lumbar region: Secondary | ICD-10-CM | POA: Diagnosis not present

## 2023-02-06 DIAGNOSIS — M5137 Other intervertebral disc degeneration, lumbosacral region: Secondary | ICD-10-CM | POA: Diagnosis not present

## 2023-02-06 DIAGNOSIS — M9901 Segmental and somatic dysfunction of cervical region: Secondary | ICD-10-CM | POA: Diagnosis not present

## 2023-02-23 DIAGNOSIS — F5101 Primary insomnia: Secondary | ICD-10-CM | POA: Diagnosis not present

## 2023-02-23 DIAGNOSIS — L603 Nail dystrophy: Secondary | ICD-10-CM | POA: Diagnosis not present

## 2023-02-23 DIAGNOSIS — R69 Illness, unspecified: Secondary | ICD-10-CM | POA: Diagnosis not present

## 2023-02-23 DIAGNOSIS — L600 Ingrowing nail: Secondary | ICD-10-CM | POA: Diagnosis not present

## 2023-02-27 DIAGNOSIS — M25511 Pain in right shoulder: Secondary | ICD-10-CM | POA: Diagnosis not present

## 2023-03-07 DIAGNOSIS — H40013 Open angle with borderline findings, low risk, bilateral: Secondary | ICD-10-CM | POA: Diagnosis not present

## 2023-03-09 DIAGNOSIS — M25511 Pain in right shoulder: Secondary | ICD-10-CM | POA: Diagnosis not present

## 2023-03-13 DIAGNOSIS — M75101 Unspecified rotator cuff tear or rupture of right shoulder, not specified as traumatic: Secondary | ICD-10-CM | POA: Diagnosis not present

## 2023-03-21 DIAGNOSIS — M7521 Bicipital tendinitis, right shoulder: Secondary | ICD-10-CM | POA: Diagnosis not present

## 2023-03-21 DIAGNOSIS — G8918 Other acute postprocedural pain: Secondary | ICD-10-CM | POA: Diagnosis not present

## 2023-03-21 DIAGNOSIS — S43431A Superior glenoid labrum lesion of right shoulder, initial encounter: Secondary | ICD-10-CM | POA: Diagnosis not present

## 2023-03-21 DIAGNOSIS — M19011 Primary osteoarthritis, right shoulder: Secondary | ICD-10-CM | POA: Diagnosis not present

## 2023-03-21 DIAGNOSIS — M75121 Complete rotator cuff tear or rupture of right shoulder, not specified as traumatic: Secondary | ICD-10-CM | POA: Diagnosis not present

## 2023-03-21 DIAGNOSIS — M67813 Other specified disorders of tendon, right shoulder: Secondary | ICD-10-CM | POA: Diagnosis not present

## 2023-03-21 DIAGNOSIS — M7541 Impingement syndrome of right shoulder: Secondary | ICD-10-CM | POA: Diagnosis not present

## 2023-03-23 NOTE — Therapy (Addendum)
OUTPATIENT PHYSICAL THERAPY SHOULDER EVALUATION   Patient Name: Mary Freeman MRN: [id number] DOB:Dec 11, 1950, 72 y.o., female Today's Date: 03/23/2023  END OF SESSION:   Past Medical History:  Diagnosis Date   Depression    Headache    migraines   PONV (postoperative nausea and vomiting)    Past Surgical History:  Procedure Laterality Date   APPENDECTOMY     BREAST EXCISIONAL BIOPSY Right    benign   COLONOSCOPY     KNEE ARTHROSCOPY Right    x3   KNEE ARTHROSCOPY W/ MENISCAL REPAIR Left    LUMBAR LAMINECTOMY  1986   REDUCTION MAMMAPLASTY Bilateral    2006   ROTATOR CUFF REPAIR Right 2012   TONSILLECTOMY     TUBAL LIGATION     Patient Active Problem List   Diagnosis Date Noted   Anxiety 02/11/2021   Adjustment disorder with depressed mood 02/11/2021   Chronic kidney disease, stage 3 unspecified 02/11/2021   Collagenous colitis 02/11/2021   Gastroesophageal reflux disease 02/11/2021   Migraine without aura, not refractory 02/11/2021   Mild depression 02/11/2021   Osteopenia 02/11/2021   Early satiety 01/12/2021   Bloating 01/12/2021   Abdominal pain 01/12/2021    PCP: Dr Sigmund Hazel  REFERRING PROVIDER: Dr Francena Hanly   REFERRING DIAG: Rt shoulder torn RC  THERAPY DIAG:  No diagnosis found.  Rationale for Evaluation and Treatment: Rehabilitation  ONSET DATE: 02/25/23  SUBJECTIVE:                                                                                                                                                                                      SUBJECTIVE STATEMENT: Patient reports falling onto Rt shoulder 02/25/23 sustaining torn RC and biceps. She underwent surgery 03/21/23 for [facility] repair and biceps tendon repair.    Hand dominance: Right  PERTINENT HISTORY: Lt shoulder scope RC, Lt 05/25/17 DCR/SAD/frayed RC/torn labrum; Rt shoulder pain; cervical pain 2018; LBP 2020; Rt knee pain 2022 - denies any medical problems   PAIN:  Are you  having pain? Yes: NPRS scale: 2/10; at worst 8/10 Pain location: Rt shoulder; elbow; hand  Pain description: sharp Aggravating factors: moving  Relieving factors: meds; avoiding movement  PRECAUTIONS: Shoulder per protocol  WEIGHT BEARING RESTRICTIONS: No  FALLS:  Has patient fallen in last 6 months? No  LIVING ENVIRONMENT: Lives with: lives alone Lives in: House/apartment Stairs: Yes: Internal: 12 steps; on right going up and External: 3 steps; on right going up Has following equipment at home: Single point cane  OCCUPATION: Retired oncology RN 2021; active with household chores, gardening, yard work    PLOF: Independent  PATIENT GOALS:get  the arm working like normal again   NEXT MD VISIT: 04/03/23  OBJECTIVE:   DIAGNOSTIC FINDINGS:  None for Rt shoulder   PATIENT SURVEYS:  FOTO inappropriate   COGNITION: Overall cognitive status: Within functional limits for tasks assessed     SENSATION: WFL  POSTURE: Patient presents with head forward posture with increased thoracic kyphosis; shoulders rounded and elevated; scapulae abducted and rotated along the thoracic spine; head of the humerus anterior in orientation.   UPPER EXTREMITY ROM:   Active ROM Right eval Left eval  Shoulder flexion    Shoulder extension    Shoulder abduction    Shoulder adduction    Shoulder internal rotation    Shoulder external rotation    Elbow flexion    Elbow extension    Wrist flexion    Wrist extension    Wrist ulnar deviation    Wrist radial deviation    Wrist pronation    Wrist supination    (Blank rows = not tested)  UPPER EXTREMITY MMT:  MMT Right eval Left eval  Shoulder flexion    Shoulder extension    Shoulder abduction    Shoulder adduction    Shoulder internal rotation    Shoulder external rotation    Middle trapezius    Lower trapezius    Elbow flexion    Elbow extension    Wrist flexion    Wrist extension    Wrist ulnar deviation    Wrist radial  deviation    Wrist pronation    Wrist supination    Grip strength (lbs)    (Blank rows = not tested)  PALPATION:  Muscular tightness Rt shoulder girdle    [facility] Adult PT Treatment:                                                DATE: 03/28/23 Therapeutic Exercise: Sitting  Scap squeeze 5-10 sec x 10  Standing  Pendulum - flexion/extension; abduction/adduction; circles CW/CCW x 10-20 each  Manual Therapy: Gentle joint mobilization pt sitting UE supported on pillow tolerated well PROM shoulder flexion ~ 80 deg - note muscle guarding, patient having difficulty relaxing for PROM  Neuromuscular re-ed: Working on posture and alignment encouraging patient to bring scapulae down and back  Modalities: Ice at home  Self Care: Assisting patient to don and doff abduction sling - difficulty with straps and snaps     PATIENT EDUCATION: Education details: POC; HEP  Person educated: Patient Education method: Explanation, Demonstration, Tactile cues, Verbal cues, and Handouts Education comprehension: verbalized understanding, returned demonstration, verbal cues required, tactile cues required, and needs further education  HOME EXERCISE PROGRAM: Access Code: [email or web address] URL: [email or web address] Date: 03/28/2023 Prepared by: Corlis Leak  Exercises - Flexion-Extension Shoulder Pendulum with Table Support  - 3-4 x daily - 7 x weekly - 1 sets - 20-30 reps - Horizontal Shoulder Pendulum with Table Support  - 3-4 x daily - 7 x weekly - 1 sets - 20-30 reps - Circular Shoulder Pendulum with Table Support  - 3-4 x daily - 7 x weekly - 1 sets - 20-30 reps  ASSESSMENT:  CLINICAL IMPRESSION: Patient is a  72 y.o. female who was seen today for physical therapy evaluation and treatment for Rt shoulder dysfunction including torn rotator cuff. She reports fall sustaining injury to shoulder 02/25/23 including torn Rt rotator cuff and  biceps tendon(s). She underwent Rt RCR and biceps tendon  repair 03/16/23. Patient presents today with UE supported in sling but sling incorrectly located on trunk -- corrected and instructed patient in proper position for sling. She has poor posture and alignment; limited shoulder ROM, strength, function following injury and surgery. She will benefit from PT to address problems identified and return to maximum rehab potential.    OBJECTIVE IMPAIRMENTS: decreased activity tolerance, decreased mobility, decreased ROM, decreased strength, hypomobility, increased fascial restrictions, increased muscle spasms, impaired flexibility, impaired UE functional use, improper body mechanics, postural dysfunction, and pain.   ACTIVITY LIMITATIONS: carrying, lifting, and reach over head  PARTICIPATION LIMITATIONS: meal prep, cleaning, laundry, driving, community activity, and yard work  PERSONAL FACTORS: Past/current experiences and Time since onset of injury/illness/exacerbation are also affecting patient's functional outcome.   REHAB POTENTIAL: Good  CLINICAL DECISION MAKING: Stable/uncomplicated  EVALUATION COMPLEXITY: Low   GOALS: Goals reviewed with patient? Yes  SHORT TERM GOALS: Target date: 05/09/2023   Independent in initial HEP  Baseline: Goal status: INITIAL  2.  Patient independent in donning and doffing sling  Baseline:  Goal status: INITIAL   LONG TERM GOALS: Target date: 06/20/2023   Rt shoulder ROM WFL's  Baseline:  Goal status: INITIAL  2.  Rt shoulder strength 4+/5 to 5/5 throughout  Baseline:  Goal status: INITIAL  3.  Improve posture and alignment with improved upright posture and posterior shoulder girdle engaged  Baseline:  Goal status: INITIAL  4.  Patient reports decreased pain in Rt shoulder with functional activities to no more than 2/10  Baseline:  Goal status: INITIAL  5.  Independent in HEP including aquatic program as indicated  Baseline:  Goal status: INITIAL  PLAN:  PT FREQUENCY: 2x/week  PT  DURATION: 8 weeks  PLANNED INTERVENTIONS: Therapeutic exercises, Therapeutic activity, Neuromuscular re-education, Patient/Family education, Self Care, Joint mobilization, Aquatic Therapy, Dry Needling, Electrical stimulation, Spinal mobilization, Cryotherapy, Moist heat, Taping, Vasopneumatic device, Ultrasound, Ionotophoresis 4mg /ml Dexamethasone, Manual therapy, and Re-evaluation  PLAN FOR NEXT SESSION: review and progress exercise; continue with postural correction and education; manual work,DN, modalities as indicated    Val Riles, PT 03/23/2023, 5:43 PM

## 2023-03-28 ENCOUNTER — Encounter: Payer: Self-pay | Admitting: Rehabilitative and Restorative Service Providers"

## 2023-03-28 ENCOUNTER — Ambulatory Visit: Payer: Medicare HMO | Attending: Orthopedic Surgery | Admitting: Rehabilitative and Restorative Service Providers"

## 2023-03-28 ENCOUNTER — Other Ambulatory Visit: Payer: Self-pay

## 2023-03-28 DIAGNOSIS — M25511 Pain in right shoulder: Secondary | ICD-10-CM | POA: Diagnosis not present

## 2023-03-28 DIAGNOSIS — R29898 Other symptoms and signs involving the musculoskeletal system: Secondary | ICD-10-CM | POA: Diagnosis not present

## 2023-03-28 DIAGNOSIS — R293 Abnormal posture: Secondary | ICD-10-CM | POA: Insufficient documentation

## 2023-03-28 DIAGNOSIS — M6281 Muscle weakness (generalized): Secondary | ICD-10-CM | POA: Diagnosis not present

## 2023-03-31 ENCOUNTER — Encounter: Payer: Self-pay | Admitting: Physical Therapy

## 2023-03-31 ENCOUNTER — Ambulatory Visit: Payer: Medicare HMO | Attending: Orthopedic Surgery | Admitting: Physical Therapy

## 2023-03-31 DIAGNOSIS — M25511 Pain in right shoulder: Secondary | ICD-10-CM | POA: Diagnosis not present

## 2023-03-31 DIAGNOSIS — R29898 Other symptoms and signs involving the musculoskeletal system: Secondary | ICD-10-CM | POA: Insufficient documentation

## 2023-03-31 DIAGNOSIS — M6281 Muscle weakness (generalized): Secondary | ICD-10-CM | POA: Diagnosis not present

## 2023-03-31 DIAGNOSIS — R293 Abnormal posture: Secondary | ICD-10-CM | POA: Diagnosis not present

## 2023-03-31 NOTE — Therapy (Signed)
OUTPATIENT PHYSICAL THERAPY    Patient Name: Mary Freeman MRN: [id number] DOB:1951-11-02, 72 y.o., female Today's Date: 03/31/2023  END OF SESSION:  PT End of Session - 03/31/23 1049     Visit Number 2    Number of Visits 24    Date for PT Re-Evaluation 06/20/23    Authorization Type [employer] medicare $20 copay    Authorization - Visit Number 2    Progress Note Due on Visit 10    PT Start Time 1006    PT Stop Time 1049    PT Time Calculation (min) 43 min    Activity Tolerance Patient tolerated treatment well    Behavior During Therapy WFL for tasks assessed/performed             Past Medical History:  Diagnosis Date   Depression    Headache    migraines   PONV (postoperative nausea and vomiting)    Past Surgical History:  Procedure Laterality Date   APPENDECTOMY     BREAST EXCISIONAL BIOPSY Right    benign   COLONOSCOPY     KNEE ARTHROSCOPY Right    x3   KNEE ARTHROSCOPY W/ MENISCAL REPAIR Left    LUMBAR LAMINECTOMY  1986   REDUCTION MAMMAPLASTY Bilateral    2006   ROTATOR CUFF REPAIR Right 2012   TONSILLECTOMY     TUBAL LIGATION     Patient Active Problem List   Diagnosis Date Noted   Anxiety 02/11/2021   Adjustment disorder with depressed mood 02/11/2021   Chronic kidney disease, stage 3 unspecified (HCC) 02/11/2021   Collagenous colitis 02/11/2021   Gastroesophageal reflux disease 02/11/2021   Migraine without aura, not refractory 02/11/2021   Mild depression 02/11/2021   Osteopenia 02/11/2021   Early satiety 01/12/2021   Bloating 01/12/2021   Abdominal pain 01/12/2021    PCP: Dr Sigmund Hazel  REFERRING PROVIDER: Dr Francena Hanly   REFERRING DIAG: Rt shoulder torn RC  THERAPY DIAG:  Acute pain of right shoulder  Other symptoms and signs involving the musculoskeletal system  Abnormal posture  Muscle weakness (generalized)  Rationale for Evaluation and Treatment: Rehabilitation  ONSET DATE: 02/25/23  SUBJECTIVE:                                                                                                                                                                                       SUBJECTIVE STATEMENT: Pt states she feels her sling gets twisted at night and she has a tough time getting it "straightened out".  She states she performed HEP   Hand dominance: Right  PERTINENT HISTORY: Patient reports falling onto Rt shoulder 02/25/23 sustaining torn  RC and biceps. She underwent surgery 03/21/23 for [facility] repair and biceps tendon repair.   Lt shoulder scope RC, Lt 05/25/17 DCR/SAD/frayed RC/torn labrum; Rt shoulder pain; cervical pain 2018; LBP 2020; Rt knee pain 2022 - denies any medical problems   PAIN:  Are you having pain? Yes: NPRS scale: 2/10; at worst 8/10 Pain location: Rt shoulder; elbow; hand  Pain description: sharp Aggravating factors: moving  Relieving factors: meds; avoiding movement  PRECAUTIONS: Shoulder per protocol  WEIGHT BEARING RESTRICTIONS: No  FALLS:  Has patient fallen in last 6 months? No  LIVING ENVIRONMENT: Lives with: lives alone Lives in: House/apartment Stairs: Yes: Internal: 12 steps; on right going up and External: 3 steps; on right going up Has following equipment at home: Single point cane  OCCUPATION: Retired oncology RN 2021; active with household chores, gardening, yard work    PLOF: Independent  PATIENT GOALS:get the arm working like normal again   NEXT MD VISIT: 04/03/23  OBJECTIVE:   UPPER EXTREMITY ROM:   Active ROM Right eval Left eval  Shoulder flexion    Shoulder extension    Shoulder abduction    Shoulder adduction    Shoulder internal rotation    Shoulder external rotation    Elbow flexion    Elbow extension    Wrist flexion    Wrist extension    Wrist ulnar deviation    Wrist radial deviation    Wrist pronation    Wrist supination    (Blank rows = not tested)  UPPER EXTREMITY MMT:  MMT Right eval Left eval  Shoulder flexion    Shoulder  extension    Shoulder abduction    Shoulder adduction    Shoulder internal rotation    Shoulder external rotation    Middle trapezius    Lower trapezius    Elbow flexion    Elbow extension    Wrist flexion    Wrist extension    Wrist ulnar deviation    Wrist radial deviation    Wrist pronation    Wrist supination    Grip strength (lbs)    (Blank rows = not tested)  PALPATION:  Muscular tightness Rt shoulder girdle    [facility] Adult PT Treatment:                                                DATE: 03/31/23 Therapeutic Exercise: Pendulum A/P, laterally, CW/CCW all x 20 Scap squeeze x 12 Elbow flex/ext x 10 Manual Therapy: PROM as tolerated in supine PROM with Rt UE on physioball SMT Rt upper traps  Self Care: Assist pt with don/doff sling   [facility] Adult PT Treatment:                                                DATE: 03/28/23 Therapeutic Exercise: Sitting  Scap squeeze 5-10 sec x 10  Standing  Pendulum - flexion/extension; abduction/adduction; circles CW/CCW x 10-20 each  Manual Therapy: Gentle joint mobilization pt sitting UE supported on pillow tolerated well PROM shoulder flexion ~ 80 deg - note muscle guarding, patient having difficulty relaxing for PROM  Neuromuscular re-ed: Working on posture and alignment encouraging patient to bring scapulae down and back  Modalities: Ice at  home  Self Care: Assisting patient to don and doff abduction sling - difficulty with straps and snaps     PATIENT EDUCATION: Education details: POC; HEP  Person educated: Patient Education method: Explanation, Demonstration, Tactile cues, Verbal cues, and Handouts Education comprehension: verbalized understanding, returned demonstration, verbal cues required, tactile cues required, and needs further education  HOME EXERCISE PROGRAM: Access Code: [email or web address] URL: [email or web address] Date: 03/28/2023 Prepared by: Corlis Leak  Exercises - Flexion-Extension Shoulder  Pendulum with Table Support  - 3-4 x daily - 7 x weekly - 1 sets - 20-30 reps - Horizontal Shoulder Pendulum with Table Support  - 3-4 x daily - 7 x weekly - 1 sets - 20-30 reps - Circular Shoulder Pendulum with Table Support  - 3-4 x daily - 7 x weekly - 1 sets - 20-30 reps  ASSESSMENT:  CLINICAL IMPRESSION: Pt with improved tolerance to PROM today.  Good tolerance to addition of elbow flex/ext today. Continues to require education on don/doff sling.  OBJECTIVE IMPAIRMENTS: decreased activity tolerance, decreased mobility, decreased ROM, decreased strength, hypomobility, increased fascial restrictions, increased muscle spasms, impaired flexibility, impaired UE functional use, improper body mechanics, postural dysfunction, and pain.     GOALS: Goals reviewed with patient? Yes  SHORT TERM GOALS: Target date: 05/09/2023   Independent in initial HEP  Baseline: Goal status: INITIAL  2.  Patient independent in donning and doffing sling  Baseline:  Goal status: INITIAL   LONG TERM GOALS: Target date: 06/20/2023   Rt shoulder ROM WFL's  Baseline:  Goal status: INITIAL  2.  Rt shoulder strength 4+/5 to 5/5 throughout  Baseline:  Goal status: INITIAL  3.  Improve posture and alignment with improved upright posture and posterior shoulder girdle engaged  Baseline:  Goal status: INITIAL  4.  Patient reports decreased pain in Rt shoulder with functional activities to no more than 2/10  Baseline:  Goal status: INITIAL  5.  Independent in HEP including aquatic program as indicated  Baseline:  Goal status: INITIAL  PLAN:  PT FREQUENCY: 2x/week  PT DURATION: 8 weeks  PLANNED INTERVENTIONS: Therapeutic exercises, Therapeutic activity, Neuromuscular re-education, Patient/Family education, Self Care, Joint mobilization, Aquatic Therapy, Dry Needling, Electrical stimulation, Spinal mobilization, Cryotherapy, Moist heat, Taping, Vasopneumatic device, Ultrasound, Ionotophoresis 4mg /ml  Dexamethasone, Manual therapy, and Re-evaluation  PLAN FOR NEXT SESSION: review and progress exercise; continue with postural correction and education; manual work,DN, modalities as indicated    ,, PT 03/31/2023, 10:50 AM

## 2023-04-04 ENCOUNTER — Encounter: Payer: Self-pay | Admitting: Rehabilitative and Restorative Service Providers"

## 2023-04-04 ENCOUNTER — Ambulatory Visit: Payer: Medicare HMO | Admitting: Rehabilitative and Restorative Service Providers"

## 2023-04-04 DIAGNOSIS — R29898 Other symptoms and signs involving the musculoskeletal system: Secondary | ICD-10-CM

## 2023-04-04 DIAGNOSIS — R293 Abnormal posture: Secondary | ICD-10-CM | POA: Diagnosis not present

## 2023-04-04 DIAGNOSIS — M6281 Muscle weakness (generalized): Secondary | ICD-10-CM | POA: Diagnosis not present

## 2023-04-04 DIAGNOSIS — M25511 Pain in right shoulder: Secondary | ICD-10-CM | POA: Diagnosis not present

## 2023-04-04 NOTE — Therapy (Signed)
OUTPATIENT PHYSICAL THERAPY    Patient Name: Mary Freeman MRN: [id number] DOB:March 15, 1951, 72 y.o., female Today's Date: 04/04/2023  END OF SESSION:  PT End of Session - 04/04/23 1147     Visit Number 3    Number of Visits 24    Date for PT Re-Evaluation 06/20/23    Authorization Type [employer] $20 copay    Authorization - Visit Number 3    Progress Note Due on Visit 10    PT Start Time 1146    PT Stop Time 1234    PT Time Calculation (min) 48 min    Activity Tolerance Patient tolerated treatment well             Past Medical History:  Diagnosis Date   Depression    Headache    migraines   PONV (postoperative nausea and vomiting)    Past Surgical History:  Procedure Laterality Date   APPENDECTOMY     BREAST EXCISIONAL BIOPSY Right    benign   COLONOSCOPY     KNEE ARTHROSCOPY Right    x3   KNEE ARTHROSCOPY W/ MENISCAL REPAIR Left    LUMBAR LAMINECTOMY  1986   REDUCTION MAMMAPLASTY Bilateral    2006   ROTATOR CUFF REPAIR Right 2012   TONSILLECTOMY     TUBAL LIGATION     Patient Active Problem List   Diagnosis Date Noted   Anxiety 02/11/2021   Adjustment disorder with depressed mood 02/11/2021   Chronic kidney disease, stage 3 unspecified (HCC) 02/11/2021   Collagenous colitis 02/11/2021   Gastroesophageal reflux disease 02/11/2021   Migraine without aura, not refractory 02/11/2021   Mild depression 02/11/2021   Osteopenia 02/11/2021   Early satiety 01/12/2021   Bloating 01/12/2021   Abdominal pain 01/12/2021    PCP: Dr Sigmund Hazel  REFERRING PROVIDER: Dr Francena Hanly   REFERRING DIAG: Rt shoulder torn RC  THERAPY DIAG:  Acute pain of right shoulder  Other symptoms and signs involving the musculoskeletal system  Abnormal posture  Muscle weakness (generalized)  Rationale for Evaluation and Treatment: Rehabilitation  ONSET DATE: 02/25/23  SUBJECTIVE:                                                                                                                                                                                       SUBJECTIVE STATEMENT: Pt saw Dr Rennis Chris and he was pleased with her progress. She was cautioned to avoid using her arm and stay in the sling until she returns to see MD. She states that her sling gets twisted at night and she has a tough time getting it "straightened out".  She states she  performed HEP. She can tell she is improving.    Hand dominance: Right  PERTINENT HISTORY: Patient reports falling onto Rt shoulder 02/25/23 sustaining torn RC and biceps. She underwent surgery 03/21/23 for [facility] repair and biceps tendon repair.   Lt shoulder scope RC, Lt 05/25/17 DCR/SAD/frayed RC/torn labrum; Rt shoulder pain; cervical pain 2018; LBP 2020; Rt knee pain 2022 - denies any medical problems   PAIN:  Are you having pain? Yes: NPRS scale: 2/10; at worst 6/10 Pain location: Rt shoulder; elbow; hand  Pain description: dull; aching; sore - sometimes sharp Aggravating factors: moving  Relieving factors: meds; avoiding movement  PRECAUTIONS: Shoulder per protocol  WEIGHT BEARING RESTRICTIONS: No  FALLS:  Has patient fallen in last 6 months? No  LIVING ENVIRONMENT: Lives with: lives alone Lives in: House/apartment Stairs: Yes: Internal: 12 steps; on right going up and External: 3 steps; on right going up Has following equipment at home: Single point cane  OCCUPATION: Retired oncology RN 2021; active with household chores, gardening, yard work    PLOF: Independent  PATIENT GOALS:get the arm working like normal again   NEXT MD VISIT: 04/03/23  OBJECTIVE:   UPPER EXTREMITY ROM:   Active ROM Right eval Left eval  Shoulder flexion    Shoulder extension    Shoulder abduction    Shoulder adduction    Shoulder internal rotation    Shoulder external rotation    Elbow flexion    Elbow extension    Wrist flexion    Wrist extension    Wrist ulnar deviation    Wrist radial deviation    Wrist  pronation    Wrist supination    (Blank rows = not tested)  UPPER EXTREMITY MMT:  MMT Right eval Left eval  Shoulder flexion    Shoulder extension    Shoulder abduction    Shoulder adduction    Shoulder internal rotation    Shoulder external rotation    Middle trapezius    Lower trapezius    Elbow flexion    Elbow extension    Wrist flexion    Wrist extension    Wrist ulnar deviation    Wrist radial deviation    Wrist pronation    Wrist supination    Grip strength (lbs)    (Blank rows = not tested)  PALPATION:  Muscular tightness Rt shoulder girdle    [facility] Adult PT Treatment:                                                DATE: 04/04/23 Therapeutic Exercise: Pendulum A/P, laterally, CW/CCW all x 20 Scap squeeze with noodle x 10 Backwards shoulder rolls 3-5 sec x 10  Elbow flex/ext x 10 Pronation/supination x 10  Rolling green swiss ball into forward flexion x 10  Manual Therapy: PROM in supine within tissue tolerance and protocol guidelines  SMT Rt upper upper quarter  Self Care: Assist pt with don/doff sling  Modalities:  Ice pack Lt shoulder x 10 min   [facility] Adult PT Treatment:                                                DATE: 03/31/23 Therapeutic Exercise: Pendulum A/P, laterally, CW/CCW all  x 20 Scap squeeze x 12 Elbow flex/ext x 10 Manual Therapy: PROM as tolerated in supine PROM with Rt UE on physioball SMT Rt upper traps  Self Care: Assist pt with don/doff sling   [facility] Adult PT Treatment:                                                DATE: 03/28/23 Therapeutic Exercise: Sitting  Scap squeeze 5-10 sec x 10  Standing  Pendulum - flexion/extension; abduction/adduction; circles CW/CCW x 10-20 each  Manual Therapy: Gentle joint mobilization pt sitting UE supported on pillow tolerated well PROM shoulder flexion ~ 80 deg - note muscle guarding, patient having difficulty relaxing for PROM  Neuromuscular re-ed: Working on posture and alignment  encouraging patient to bring scapulae down and back  Modalities: Ice at home  Self Care: Assisting patient to don and doff abduction sling - difficulty with straps and snaps     PATIENT EDUCATION: Education details: POC; HEP  Person educated: Patient Education method: Explanation, Demonstration, Tactile cues, Verbal cues, and Handouts Education comprehension: verbalized understanding, returned demonstration, verbal cues required, tactile cues required, and needs further education  HOME EXERCISE PROGRAM:  Access Code: [email or web address] URL: [email or web address] Date: 04/04/2023 Prepared by: Corlis Leak  Exercises - Flexion-Extension Shoulder Pendulum with Table Support  - 3-4 x daily - 7 x weekly - 1 sets - 20-30 reps - Horizontal Shoulder Pendulum with Table Support  - 3-4 x daily - 7 x weekly - 1 sets - 20-30 reps - Circular Shoulder Pendulum with Table Support  - 3-4 x daily - 7 x weekly - 1 sets - 20-30 reps - Seated Elbow Flexion and Extension AROM  - 2 x daily - 7 x weekly - 1 sets - 3 reps - 3-5 sec  hold - Seated Forearm Pronation and Supination AROM  - 2 x daily - 7 x weekly - 1 sets - 10 reps - 3-5 sec  hold - Standing Shoulder Flexion AAROM with Swiss Ball  - 2 x daily - 7 x weekly - 1 sets - 10 reps - 5-10 sec  hold - Seated Scapular Retraction  - 2 x daily - 7 x weekly - 1-2 sets - 10 reps - 10 sec  hold - Standing Scapular Retraction  - 3 x daily - 7 x weekly - 1 sets - 10 reps - 10 sec  hold  ASSESSMENT:  CLINICAL IMPRESSION: Patient reports decreased pain in the Lt shoulder compared to last week. MD was pleased with progress. [place] continued to work on [employer]. Added exercises for home. Continued with STM Lt upper quarter and PROM. Continues to require education on don/doff sling.  OBJECTIVE IMPAIRMENTS: decreased activity tolerance, decreased mobility, decreased ROM, decreased strength, hypomobility, increased fascial restrictions, increased muscle spasms,  impaired flexibility, impaired UE functional use, improper body mechanics, postural dysfunction, and pain.     GOALS: Goals reviewed with patient? Yes  SHORT TERM GOALS: Target date: 05/09/2023   Independent in initial HEP  Baseline: Goal status: INITIAL  2.  Patient independent in donning and doffing sling  Baseline:  Goal status: INITIAL   LONG TERM GOALS: Target date: 06/20/2023   Rt shoulder ROM WFL's  Baseline:  Goal status: INITIAL  2.  Rt shoulder strength 4+/5 to 5/5 throughout  Baseline:  Goal status: INITIAL  3.  Improve posture  and alignment with improved upright posture and posterior shoulder girdle engaged  Baseline:  Goal status: INITIAL  4.  Patient reports decreased pain in Rt shoulder with functional activities to no more than 2/10  Baseline:  Goal status: INITIAL  5.  Independent in HEP including aquatic program as indicated  Baseline:  Goal status: INITIAL  PLAN:  PT FREQUENCY: 2x/week  PT DURATION: 8 weeks  PLANNED INTERVENTIONS: Therapeutic exercises, Therapeutic activity, Neuromuscular re-education, Patient/Family education, Self Care, Joint mobilization, Aquatic Therapy, Dry Needling, Electrical stimulation, Spinal mobilization, Cryotherapy, Moist heat, Taping, Vasopneumatic device, Ultrasound, Ionotophoresis 4mg /ml Dexamethasone, Manual therapy, and Re-evaluation  PLAN FOR NEXT SESSION: review and progress exercise; continue with postural correction and education; manual work,DN, modalities as indicated     Rober Minion, PT 04/04/2023, 11:48 AM

## 2023-04-06 ENCOUNTER — Ambulatory Visit: Payer: Medicare HMO | Admitting: Rehabilitative and Restorative Service Providers"

## 2023-04-06 ENCOUNTER — Encounter: Payer: Self-pay | Admitting: Rehabilitative and Restorative Service Providers"

## 2023-04-06 DIAGNOSIS — M25511 Pain in right shoulder: Secondary | ICD-10-CM | POA: Diagnosis not present

## 2023-04-06 DIAGNOSIS — M6281 Muscle weakness (generalized): Secondary | ICD-10-CM | POA: Diagnosis not present

## 2023-04-06 DIAGNOSIS — R29898 Other symptoms and signs involving the musculoskeletal system: Secondary | ICD-10-CM | POA: Diagnosis not present

## 2023-04-06 DIAGNOSIS — R293 Abnormal posture: Secondary | ICD-10-CM

## 2023-04-06 NOTE — Therapy (Signed)
OUTPATIENT PHYSICAL THERAPY    Patient Name: Mary Freeman MRN: [id number] DOB:Mar 10, 1951, 72 y.o., female Today's Date: 04/06/2023  END OF SESSION:  PT End of Session - 04/06/23 1103     Visit Number 4    Number of Visits 24    Date for PT Re-Evaluation 06/20/23    Authorization Type [employer] medic[employer] $20 copay    Progress Note Due on Visit 10    PT Start Time 1102    PT Stop Time 1150    PT Time Calculation (min) 48 min    Activity Tolerance Patient tolerated treatment well             Past Medical History:  Diagnosis Date   Depression    Headache    migraines   PONV (postoperative nausea and vomiting)    Past Surgical History:  Procedure Laterality Date   APPENDECTOMY     BREAST EXCISIONAL BIOPSY Right    benign   COLONOSCOPY     KNEE ARTHROSCOPY Right    x3   KNEE ARTHROSCOPY W/ MENISCAL REPAIR Left    LUMBAR LAMINECTOMY  1986   REDUCTION MAMMAPLASTY Bilateral    2006   ROTATOR CUFF REPAIR Right 2012   TONSILLECTOMY     TUBAL LIGATION     Patient Active Problem List   Diagnosis Date Noted   Anxiety 02/11/2021   Adjustment disorder with depressed mood 02/11/2021   Chronic kidney disease, stage 3 unspecified (HCC) 02/11/2021   Collagenous colitis 02/11/2021   Gastroesophageal reflux disease 02/11/2021   Migraine without aura, not refractory 02/11/2021   Mild depression 02/11/2021   Osteopenia 02/11/2021   Early satiety 01/12/2021   Bloating 01/12/2021   Abdominal pain 01/12/2021    PCP: Dr Sigmund Hazel  REFERRING PROVIDER: Dr Francena Hanly   REFERRING DIAG: Rt shoulder torn RC  THERAPY DIAG:  Acute pain of right shoulder  Other symptoms and signs involving the musculoskeletal system  Abnormal posture  Muscle weakness (generalized)  Rationale for Evaluation and Treatment: Rehabilitation  ONSET DATE: 02/25/23  SUBJECTIVE:                                                                                                                                                                                       SUBJECTIVE STATEMENT: Patient reports that she has had no pain for the past 3-4 days. She was again cautioned to avoid using her arm and stay in the sling until she returns to see MD. She states that her sling gets less twisted at night but it now feels a little loose.  She states she performed HEP without difficulty. She can tell  she is improving.    Hand dominance: Right  PERTINENT HISTORY: Patient reports falling onto Rt shoulder 02/25/23 sustaining torn RC and biceps. She underwent surgery 03/21/23 for [facility] repair and biceps tendon repair.   Lt shoulder scope RC, Lt 05/25/17 DCR/SAD/frayed RC/torn labrum; Rt shoulder pain; cervical pain 2018; LBP 2020; Rt knee pain 2022 - denies any medical problems   PAIN:  Are you having pain? Yes: NPRS scale: 0/10; at worst 2/10 Pain location: Rt shoulder; elbow; hand  Pain description: tightness; dull; aching; sore - occasionally sharp Aggravating factors: moving  Relieving factors: meds; avoiding movement  PRECAUTIONS: Shoulder per protocol  WEIGHT BEARING RESTRICTIONS: No  FALLS:  Has patient fallen in last 6 months? No  LIVING ENVIRONMENT: Lives with: lives alone Lives in: House/apartment Stairs: Yes: Internal: 12 steps; on right going up and External: 3 steps; on right going up Has following equipment at home: Single point cane  OCCUPATION: Retired oncology RN 2021; active with household chores, gardening, yard work    PLOF: Independent  PATIENT GOALS:get the arm working like normal again   NEXT MD VISIT: 04/03/23  OBJECTIVE:   UPPER EXTREMITY ROM:   Active ROM Right eval Left eval  Shoulder flexion    Shoulder extension    Shoulder abduction    Shoulder adduction    Shoulder internal rotation    Shoulder external rotation    Elbow flexion    Elbow extension    Wrist flexion    Wrist extension    Wrist ulnar deviation    Wrist radial deviation    Wrist  pronation    Wrist supination    (Blank rows = not tested)  UPPER EXTREMITY MMT:  MMT Right eval Left eval  Shoulder flexion    Shoulder extension    Shoulder abduction    Shoulder adduction    Shoulder internal rotation    Shoulder external rotation    Middle trapezius    Lower trapezius    Elbow flexion    Elbow extension    Wrist flexion    Wrist extension    Wrist ulnar deviation    Wrist radial deviation    Wrist pronation    Wrist supination    Grip strength (lbs)    (Blank rows = not tested)  PALPATION:  Muscular tightness Rt shoulder girdle    [facility] Adult PT Treatment:                                                DATE: 04/06/23 Therapeutic Exercise: Pendulum A/P, laterally, CW/CCW all x 20 Scap squeeze with noodle x 10 Backwards shoulder rolls 3-5 sec x 10  Elbow flex/ext x 10 Pronation/supination x 10  Rolling green swiss ball into forward flexion x 10  Manual Therapy: PROM in supine within tissue tolerance and protocol guidelines  SMT Rt upper upper quarter  Self Care: Assist pt with don/doff sling  Modalities:  Vaso 34 deg; low pressure x 10 min Lt shoulder   [facility] Adult PT Treatment:                                                DATE: 04/04/23 Therapeutic Exercise: Pendulum A/P, laterally, CW/CCW all x  20 Scap squeeze with noodle x 10 Backwards shoulder rolls 3-5 sec x 10  Elbow flex/ext x 10 Pronation/supination x 10  Rolling green swiss ball into forward flexion x 10  Manual Therapy: PROM in supine within tissue tolerance and protocol guidelines  SMT Rt upper upper quarter  Self Care: Assist pt with don/doff sling  Modalities:  Ice pack Lt shoulder x 10 min   PATIENT EDUCATION: Education details: POC; HEP  Person educated: Patient Education method: Explanation, Demonstration, Tactile cues, Verbal cues, and Handouts Education comprehension: verbalized understanding, returned demonstration, verbal cues required, tactile cues required, and  needs further education  HOME EXERCISE PROGRAM:  Access Code: [email or web address] URL: [email or web address] Date: 04/04/2023 Prepared by: Corlis Leak  Exercises - Flexion-Extension Shoulder Pendulum with Table Support  - 3-4 x daily - 7 x weekly - 1 sets - 20-30 reps - Horizontal Shoulder Pendulum with Table Support  - 3-4 x daily - 7 x weekly - 1 sets - 20-30 reps - Circular Shoulder Pendulum with Table Support  - 3-4 x daily - 7 x weekly - 1 sets - 20-30 reps - Seated Elbow Flexion and Extension AROM  - 2 x daily - 7 x weekly - 1 sets - 3 reps - 3-5 sec  hold - Seated Forearm Pronation and Supination AROM  - 2 x daily - 7 x weekly - 1 sets - 10 reps - 3-5 sec  hold - Standing Shoulder Flexion AAROM with Swiss Ball  - 2 x daily - 7 x weekly - 1 sets - 10 reps - 5-10 sec  hold - Seated Scapular Retraction  - 2 x daily - 7 x weekly - 1-2 sets - 10 reps - 10 sec  hold - Standing Scapular Retraction  - 3 x daily - 7 x weekly - 1 sets - 10 reps - 10 sec  hold  ASSESSMENT:  CLINICAL IMPRESSION: Patient reports continued decreased pain in the Lt shoulder. Catalina continued to work on [employer]. Added exercises for home. Continued with STM Lt upper quarter and PROM. Continues to require adjustment of sling.  OBJECTIVE IMPAIRMENTS: decreased activity tolerance, decreased mobility, decreased ROM, decreased strength, hypomobility, increased fascial restrictions, increased muscle spasms, impaired flexibility, impaired UE functional use, improper body mechanics, postural dysfunction, and pain.     GOALS: Goals reviewed with patient? Yes  SHORT TERM GOALS: Target date: 05/09/2023   Independent in initial HEP  Baseline: Goal status: INITIAL  2.  Patient independent in donning and doffing sling  Baseline:  Goal status: INITIAL   LONG TERM GOALS: Target date: 06/20/2023   Rt shoulder ROM WFL's  Baseline:  Goal status: INITIAL  2.  Rt shoulder strength 4+/5 to 5/5 throughout  Baseline:   Goal status: INITIAL  3.  Improve posture and alignment with improved upright posture and posterior shoulder girdle engaged  Baseline:  Goal status: INITIAL  4.  Patient reports decreased pain in Rt shoulder with functional activities to no more than 2/10  Baseline:  Goal status: INITIAL  5.  Independent in HEP including aquatic program as indicated  Baseline:  Goal status: INITIAL  PLAN:  PT FREQUENCY: 2x/week  PT DURATION: 8 weeks  PLANNED INTERVENTIONS: Therapeutic exercises, Therapeutic activity, Neuromuscular re-education, Patient/Family education, Self Care, Joint mobilization, Aquatic Therapy, Dry Needling, Electrical stimulation, Spinal mobilization, Cryotherapy, Moist heat, Taping, Vasopneumatic device, Ultrasound, Ionotophoresis 4mg /ml Dexamethasone, Manual therapy, and Re-evaluation  PLAN FOR NEXT SESSION: review and progress exercise; continue with postural correction and education; manual work,DN,  modalities as indicated     Rober Minion, PT 04/06/2023, 11:03 AM

## 2023-04-11 ENCOUNTER — Encounter: Payer: Self-pay | Admitting: Physical Therapy

## 2023-04-11 ENCOUNTER — Ambulatory Visit: Payer: Medicare HMO | Admitting: Physical Therapy

## 2023-04-11 DIAGNOSIS — M6281 Muscle weakness (generalized): Secondary | ICD-10-CM | POA: Diagnosis not present

## 2023-04-11 DIAGNOSIS — M25511 Pain in right shoulder: Secondary | ICD-10-CM | POA: Diagnosis not present

## 2023-04-11 DIAGNOSIS — R293 Abnormal posture: Secondary | ICD-10-CM | POA: Diagnosis not present

## 2023-04-11 DIAGNOSIS — R29898 Other symptoms and signs involving the musculoskeletal system: Secondary | ICD-10-CM

## 2023-04-11 NOTE — Therapy (Signed)
OUTPATIENT PHYSICAL THERAPY    Patient Name: Mary Freeman MRN: [id number] DOB:03-May-1951, 72 y.o., female Today's Date: 04/11/2023  END OF SESSION:  PT End of Session - 04/11/23 1135     Visit Number 5    Number of Visits 24    Date for PT Re-Evaluation 06/20/23    Authorization Type [employer] $20 copay    Authorization - Visit Number 5    Progress Note Due on Visit 10    PT Start Time 1100    PT Stop Time 1145    PT Time Calculation (min) 45 min    Activity Tolerance Patient tolerated treatment well    Behavior During Therapy WFL for tasks assessed/performed              Past Medical History:  Diagnosis Date   Depression    Headache    migraines   PONV (postoperative nausea and vomiting)    Past Surgical History:  Procedure Laterality Date   APPENDECTOMY     BREAST EXCISIONAL BIOPSY Right    benign   COLONOSCOPY     KNEE ARTHROSCOPY Right    x3   KNEE ARTHROSCOPY W/ MENISCAL REPAIR Left    LUMBAR LAMINECTOMY  1986   REDUCTION MAMMAPLASTY Bilateral    2006   ROTATOR CUFF REPAIR Right 2012   TONSILLECTOMY     TUBAL LIGATION     Patient Active Problem List   Diagnosis Date Noted   Anxiety 02/11/2021   Adjustment disorder with depressed mood 02/11/2021   Chronic kidney disease, stage 3 unspecified (HCC) 02/11/2021   Collagenous colitis 02/11/2021   Gastroesophageal reflux disease 02/11/2021   Migraine without aura, not refractory 02/11/2021   Mild depression 02/11/2021   Osteopenia 02/11/2021   Early satiety 01/12/2021   Bloating 01/12/2021   Abdominal pain 01/12/2021    PCP: Dr Sigmund Hazel  REFERRING PROVIDER: Dr Francena Hanly   REFERRING DIAG: Rt shoulder torn RC  THERAPY DIAG:  Acute pain of right shoulder  Other symptoms and signs involving the musculoskeletal system  Abnormal posture  Muscle weakness (generalized)  Rationale for Evaluation and Treatment: Rehabilitation  ONSET DATE: 02/25/23  SUBJECTIVE:                                                                                                                                                                                       SUBJECTIVE STATEMENT: Patient reports she has been performing HEP. She states she has very little pain, mostly stiffness. She states she feels her elbow is moving well   Hand dominance: Right  PERTINENT HISTORY: Patient reports falling onto Rt shoulder 02/25/23 sustaining torn  RC and biceps. She underwent surgery 03/21/23 for [facility] repair and biceps tendon repair.   Lt shoulder scope RC, Lt 05/25/17 DCR/SAD/frayed RC/torn labrum; Rt shoulder pain; cervical pain 2018; LBP 2020; Rt knee pain 2022 - denies any medical problems   PAIN:  Are you having pain? Yes: NPRS scale: 1/10; at worst 2/10 Pain location: Rt shoulder; elbow; hand  Pain description: tightness; dull; aching; sore - occasionally sharp Aggravating factors: moving  Relieving factors: meds; avoiding movement  PRECAUTIONS: Shoulder per protocol  WEIGHT BEARING RESTRICTIONS: No  FALLS:  Has patient fallen in last 6 months? No  LIVING ENVIRONMENT: Lives with: lives alone Lives in: House/apartment Stairs: Yes: Internal: 12 steps; on right going up and External: 3 steps; on right going up Has following equipment at home: Single point cane  OCCUPATION: Retired oncology RN 2021; active with household chores, gardening, yard work    PLOF: Independent  PATIENT GOALS:get the arm working like normal again   NEXT MD VISIT: 04/03/23  OBJECTIVE:   UPPER EXTREMITY ROM:   Active ROM Right eval Left eval  Shoulder flexion    Shoulder extension    Shoulder abduction    Shoulder adduction    Shoulder internal rotation    Shoulder external rotation    Elbow flexion    Elbow extension    Wrist flexion    Wrist extension    Wrist ulnar deviation    Wrist radial deviation    Wrist pronation    Wrist supination    (Blank rows = not tested)  UPPER EXTREMITY MMT:  MMT  Right eval Left eval  Shoulder flexion    Shoulder extension    Shoulder abduction    Shoulder adduction    Shoulder internal rotation    Shoulder external rotation    Middle trapezius    Lower trapezius    Elbow flexion    Elbow extension    Wrist flexion    Wrist extension    Wrist ulnar deviation    Wrist radial deviation    Wrist pronation    Wrist supination    Grip strength (lbs)    (Blank rows = not tested)  PALPATION:  Muscular tightness Rt shoulder girdle   [facility] Adult PT Treatment:                                                DATE: 04/11/23 Therapeutic Exercise: PROM all directions with UES on physioball Scap squeeze with noodle x 10 Backward shoulder roll x 10 Elbow flex/ext x 10 Pronation/supination x 10  Manual Therapy: PROM in supine within tissue tolerance and protocol guidelines  SMT Rt upper upper quarter Modalities: Vaso x 10 min 34 degrees low pressure    [facility] Adult PT Treatment:                                                DATE: 04/06/23 Therapeutic Exercise: Pendulum A/P, laterally, CW/CCW all x 20 Scap squeeze with noodle x 10 Backwards shoulder rolls 3-5 sec x 10  Elbow flex/ext x 10 Pronation/supination x 10  Rolling green swiss ball into forward flexion x 10  Manual Therapy: PROM in supine within tissue tolerance and protocol guidelines  SMT  Rt upper upper quarter  Self Care: Assist pt with don/doff sling  Modalities:  Vaso 34 deg; low pressure x 10 min Lt shoulder      PATIENT EDUCATION: Education details: POC; HEP  Person educated: Patient Education method: Programmer, multimedia, Demonstration, Tactile cues, Verbal cues, and Handouts Education comprehension: verbalized understanding, returned demonstration, verbal cues required, tactile cues required, and needs further education  HOME EXERCISE PROGRAM:  Access Code: [email or web address] URL: [email or web address] Date: 04/04/2023 Prepared by: Corlis Leak  Exercises -  Flexion-Extension Shoulder Pendulum with Table Support  - 3-4 x daily - 7 x weekly - 1 sets - 20-30 reps - Horizontal Shoulder Pendulum with Table Support  - 3-4 x daily - 7 x weekly - 1 sets - 20-30 reps - Circular Shoulder Pendulum with Table Support  - 3-4 x daily - 7 x weekly - 1 sets - 20-30 reps - Seated Elbow Flexion and Extension AROM  - 2 x daily - 7 x weekly - 1 sets - 3 reps - 3-5 sec  hold - Seated Forearm Pronation and Supination AROM  - 2 x daily - 7 x weekly - 1 sets - 10 reps - 3-5 sec  hold - Standing Shoulder Flexion AAROM with Swiss Ball  - 2 x daily - 7 x weekly - 1 sets - 10 reps - 5-10 sec  hold - Seated Scapular Retraction  - 2 x daily - 7 x weekly - 1-2 sets - 10 reps - 10 sec  hold - Standing Scapular Retraction  - 3 x daily - 7 x weekly - 1 sets - 10 reps - 10 sec  hold  ASSESSMENT:  CLINICAL IMPRESSION: Pt with decreased muscle guarding, improved tolerance to PROM  OBJECTIVE IMPAIRMENTS: decreased activity tolerance, decreased mobility, decreased ROM, decreased strength, hypomobility, increased fascial restrictions, increased muscle spasms, impaired flexibility, impaired UE functional use, improper body mechanics, postural dysfunction, and pain.     GOALS: Goals reviewed with patient? Yes  SHORT TERM GOALS: Target date: 05/09/2023   Independent in initial HEP  Baseline: Goal status: INITIAL  2.  Patient independent in donning and doffing sling  Baseline:  Goal status: INITIAL   LONG TERM GOALS: Target date: 06/20/2023   Rt shoulder ROM WFL's  Baseline:  Goal status: INITIAL  2.  Rt shoulder strength 4+/5 to 5/5 throughout  Baseline:  Goal status: INITIAL  3.  Improve posture and alignment with improved upright posture and posterior shoulder girdle engaged  Baseline:  Goal status: INITIAL  4.  Patient reports decreased pain in Rt shoulder with functional activities to no more than 2/10  Baseline:  Goal status: INITIAL  5.  Independent in HEP  including aquatic program as indicated  Baseline:  Goal status: INITIAL  PLAN:  PT FREQUENCY: 2x/week  PT DURATION: 8 weeks  PLANNED INTERVENTIONS: Therapeutic exercises, Therapeutic activity, Neuromuscular re-education, Patient/Family education, Self Care, Joint mobilization, Aquatic Therapy, Dry Needling, Electrical stimulation, Spinal mobilization, Cryotherapy, Moist heat, Taping, Vasopneumatic device, Ultrasound, Ionotophoresis 4mg /ml Dexamethasone, Manual therapy, and Re-evaluation  PLAN FOR NEXT SESSION: review and progress exercise; continue with postural correction and education; manual work,DN, modalities as indicated    ,, PT 04/11/2023, 11:36 AM

## 2023-04-13 ENCOUNTER — Encounter: Payer: Self-pay | Admitting: Rehabilitative and Restorative Service Providers"

## 2023-04-13 ENCOUNTER — Ambulatory Visit: Payer: Medicare HMO | Admitting: Rehabilitative and Restorative Service Providers"

## 2023-04-13 DIAGNOSIS — R29898 Other symptoms and signs involving the musculoskeletal system: Secondary | ICD-10-CM

## 2023-04-13 DIAGNOSIS — M6281 Muscle weakness (generalized): Secondary | ICD-10-CM

## 2023-04-13 DIAGNOSIS — M25511 Pain in right shoulder: Secondary | ICD-10-CM | POA: Diagnosis not present

## 2023-04-13 DIAGNOSIS — R293 Abnormal posture: Secondary | ICD-10-CM | POA: Diagnosis not present

## 2023-04-13 NOTE — Therapy (Signed)
OUTPATIENT PHYSICAL THERAPY    Patient Name: Mary Freeman MRN: [id number] DOB:Feb 02, 1951, 72 y.o., female Today's Date: 04/13/2023  END OF SESSION:  PT End of Session - 04/13/23 1102     Visit Number 6    Number of Visits 24    Date for PT Re-Evaluation 06/20/23    Authorization Type [employer] medicare $20 copay    Authorization - Visit Number 6    Progress Note Due on Visit 10    PT Start Time 1100    PT Stop Time 1148    PT Time Calculation (min) 48 min    Activity Tolerance Patient tolerated treatment well              Past Medical History:  Diagnosis Date   Depression    Headache    migraines   PONV (postoperative nausea and vomiting)    Past Surgical History:  Procedure Laterality Date   APPENDECTOMY     BREAST EXCISIONAL BIOPSY Right    benign   COLONOSCOPY     KNEE ARTHROSCOPY Right    x3   KNEE ARTHROSCOPY W/ MENISCAL REPAIR Left    LUMBAR LAMINECTOMY  1986   REDUCTION MAMMAPLASTY Bilateral    2006   ROTATOR CUFF REPAIR Right 2012   TONSILLECTOMY     TUBAL LIGATION     Patient Active Problem List   Diagnosis Date Noted   Anxiety 02/11/2021   Adjustment disorder with depressed mood 02/11/2021   Chronic kidney disease, stage 3 unspecified (HCC) 02/11/2021   Collagenous colitis 02/11/2021   Gastroesophageal reflux disease 02/11/2021   Migraine without aura, not refractory 02/11/2021   Mild depression 02/11/2021   Osteopenia 02/11/2021   Early satiety 01/12/2021   Bloating 01/12/2021   Abdominal pain 01/12/2021    PCP: Dr Sigmund Hazel  REFERRING PROVIDER: Dr Francena Hanly   REFERRING DIAG: Rt shoulder torn RC  THERAPY DIAG:  Acute pain of right shoulder  Other symptoms and signs involving the musculoskeletal system  Abnormal posture  Muscle weakness (generalized)  Rationale for Evaluation and Treatment: Rehabilitation  ONSET DATE: 02/25/23  SUBJECTIVE:                                                                                                                                                                                       SUBJECTIVE STATEMENT: Patient reports she has increased pain in the Rt shoulder today. She is not sleeping well at night and is just frustrated with the the shoulder.   Hand dominance: Right  PERTINENT HISTORY: Patient reports falling onto Rt shoulder 02/25/23 sustaining torn RC and biceps. She underwent surgery 03/21/23 for [facility]  repair and biceps tendon repair.   Lt shoulder scope RC, Lt 05/25/17 DCR/SAD/frayed RC/torn labrum; Rt shoulder pain; cervical pain 2018; LBP 2020; Rt knee pain 2022 - denies any medical problems   PAIN:  Are you having pain? Yes: NPRS scale: 4-5/10 Pain location: Rt shoulder; elbow; hand  Pain description: tightness; dull; aching; sore - occasionally sharp Aggravating factors: moving  Relieving factors: meds; avoiding movement  PRECAUTIONS: Shoulder per protocol  WEIGHT BEARING RESTRICTIONS: No  FALLS:  Has patient fallen in last 6 months? No  LIVING ENVIRONMENT: Lives with: lives alone Lives in: House/apartment Stairs: Yes: Internal: 12 steps; on right going up and External: 3 steps; on right going up Has following equipment at home: Single point cane  OCCUPATION: Retired oncology RN 2021; active with household chores, gardening, yard work    PLOF: Independent  PATIENT GOALS:get the arm working like normal again   NEXT MD VISIT: 05/03/23  OBJECTIVE:   UPPER EXTREMITY ROM:   Active ROM Right eval Left eval  Shoulder flexion    Shoulder extension    Shoulder abduction    Shoulder adduction    Shoulder internal rotation    Shoulder external rotation    Elbow flexion    Elbow extension    Wrist flexion    Wrist extension    Wrist ulnar deviation    Wrist radial deviation    Wrist pronation    Wrist supination    (Blank rows = not tested)  UPPER EXTREMITY MMT:  MMT Right eval Left eval  Shoulder flexion    Shoulder extension     Shoulder abduction    Shoulder adduction    Shoulder internal rotation    Shoulder external rotation    Middle trapezius    Lower trapezius    Elbow flexion    Elbow extension    Wrist flexion    Wrist extension    Wrist ulnar deviation    Wrist radial deviation    Wrist pronation    Wrist supination    Grip strength (lbs)    (Blank rows = not tested)  PALPATION:  Muscular tightness Rt shoulder girdle   [facility] Adult PT Treatment:                                                DATE: 04/13/23 Therapeutic Exercise: Pendulum A/P, laterally, CW/CCW all x 20 Scap squeeze with noodle x 10 Backwards shoulder rolls 3-5 sec x 10  Elbow flex/ext x 10 Pronation/supination x 10  Gentle ER foam roll along spine in standing elbow 90 deg flexion with cane ER 5 sec x 5  Rolling green swiss ball into forward flexion x 10  Manual Therapy: PROM in supine within tissue tolerance and protocol guidelines  SMT Rt upper upper quarter Self Care: Assist pt with don/doff sling  Modalities:  Vaso 34 deg; low pressure x 10 min Lt shoulder   [facility] Adult PT Treatment:                                                DATE: 04/11/23 Therapeutic Exercise: PROM all directions with UES on physioball Scap squeeze with noodle x 10 Backward shoulder roll x 10 Elbow flex/ext x  10 Pronation/supination x 10  Manual Therapy: PROM in supine within tissue tolerance and protocol guidelines  SMT Rt upper upper quarter Modalities: Vaso x 10 min 34 degrees low pressure     PATIENT EDUCATION: Education details: POC; HEP  Person educated: Patient Education method: Programmer, multimedia, Demonstration, Tactile cues, Verbal cues, and Handouts Education comprehension: verbalized understanding, returned demonstration, verbal cues required, tactile cues required, and needs further education  HOME EXERCISE PROGRAM:  Access Code: [email or web address] URL: [email or web address] Date: 04/04/2023 Prepared by: Corlis Leak  Exercises - Flexion-Extension Shoulder Pendulum with Table Support  - 3-4 x daily - 7 x weekly - 1 sets - 20-30 reps - Horizontal Shoulder Pendulum with Table Support  - 3-4 x daily - 7 x weekly - 1 sets - 20-30 reps - Circular Shoulder Pendulum with Table Support  - 3-4 x daily - 7 x weekly - 1 sets - 20-30 reps - Seated Elbow Flexion and Extension AROM  - 2 x daily - 7 x weekly - 1 sets - 3 reps - 3-5 sec  hold - Seated Forearm Pronation and Supination AROM  - 2 x daily - 7 x weekly - 1 sets - 10 reps - 3-5 sec  hold - Standing Shoulder Flexion AAROM with Swiss Ball  - 2 x daily - 7 x weekly - 1 sets - 10 reps - 5-10 sec  hold - Seated Scapular Retraction  - 2 x daily - 7 x weekly - 1-2 sets - 10 reps - 10 sec  hold - Standing Scapular Retraction  - 3 x daily - 7 x weekly - 1 sets - 10 reps - 10 sec  hold  ASSESSMENT:  CLINICAL IMPRESSION: Some increased pain and discomfort last night and this morning. Continued treatment including exercise; manual work; PROM; stretching; vaso. Decreased pain with treatment. Continue to focus on increasing joint mobility as tolerated and protocol dictates.   OBJECTIVE IMPAIRMENTS: decreased activity tolerance, decreased mobility, decreased ROM, decreased strength, hypomobility, increased fascial restrictions, increased muscle spasms, impaired flexibility, impaired UE functional use, improper body mechanics, postural dysfunction, and pain.     GOALS: Goals reviewed with patient? Yes  SHORT TERM GOALS: Target date: 05/09/2023   Independent in initial HEP  Baseline: Goal status: INITIAL  2.  Patient independent in donning and doffing sling  Baseline:  Goal status: INITIAL   LONG TERM GOALS: Target date: 06/20/2023   Rt shoulder ROM WFL's  Baseline:  Goal status: INITIAL  2.  Rt shoulder strength 4+/5 to 5/5 throughout  Baseline:  Goal status: INITIAL  3.  Improve posture and alignment with improved upright posture and posterior  shoulder girdle engaged  Baseline:  Goal status: INITIAL  4.  Patient reports decreased pain in Rt shoulder with functional activities to no more than 2/10  Baseline:  Goal status: INITIAL  5.  Independent in HEP including aquatic program as indicated  Baseline:  Goal status: INITIAL  PLAN:  PT FREQUENCY: 2x/week  PT DURATION: 8 weeks  PLANNED INTERVENTIONS: Therapeutic exercises, Therapeutic activity, Neuromuscular re-education, Patient/Family education, Self Care, Joint mobilization, Aquatic Therapy, Dry Needling, Electrical stimulation, Spinal mobilization, Cryotherapy, Moist heat, Taping, Vasopneumatic device, Ultrasound, Ionotophoresis 4mg /ml Dexamethasone, Manual therapy, and Re-evaluation  PLAN FOR NEXT SESSION: review and progress exercise; continue with postural correction and education; manual work,DN, modalities as indicated    [employer], PT 04/13/2023, 11:03 AM

## 2023-04-18 ENCOUNTER — Encounter: Payer: Self-pay | Admitting: Rehabilitative and Restorative Service Providers"

## 2023-04-18 ENCOUNTER — Ambulatory Visit: Payer: Medicare HMO | Admitting: Rehabilitative and Restorative Service Providers"

## 2023-04-18 DIAGNOSIS — R293 Abnormal posture: Secondary | ICD-10-CM | POA: Diagnosis not present

## 2023-04-18 DIAGNOSIS — M6281 Muscle weakness (generalized): Secondary | ICD-10-CM | POA: Diagnosis not present

## 2023-04-18 DIAGNOSIS — M25511 Pain in right shoulder: Secondary | ICD-10-CM | POA: Diagnosis not present

## 2023-04-18 DIAGNOSIS — R29898 Other symptoms and signs involving the musculoskeletal system: Secondary | ICD-10-CM | POA: Diagnosis not present

## 2023-04-18 NOTE — Therapy (Signed)
OUTPATIENT PHYSICAL THERAPY    Patient Name: Mary Freeman MRN: [id number] DOB:01-Nov-1951, 72 y.o., female Today's Date: 04/18/2023  END OF SESSION:  PT End of Session - 04/18/23 1145     Visit Number 7    Number of Visits 24    Date for PT Re-Evaluation 06/20/23    Authorization Type [employer] $20 copay    Authorization - Visit Number 7    Progress Note Due on Visit 10    PT Start Time 1145    PT Stop Time 1236    PT Time Calculation (min) 51 min    Activity Tolerance Patient tolerated treatment well              Past Medical History:  Diagnosis Date   Depression    Headache    migraines   PONV (postoperative nausea and vomiting)    Past Surgical History:  Procedure Laterality Date   APPENDECTOMY     BREAST EXCISIONAL BIOPSY Right    benign   COLONOSCOPY     KNEE ARTHROSCOPY Right    x3   KNEE ARTHROSCOPY W/ MENISCAL REPAIR Left    LUMBAR LAMINECTOMY  1986   REDUCTION MAMMAPLASTY Bilateral    2006   ROTATOR CUFF REPAIR Right 2012   TONSILLECTOMY     TUBAL LIGATION     Patient Active Problem List   Diagnosis Date Noted   Anxiety 02/11/2021   Adjustment disorder with depressed mood 02/11/2021   Chronic kidney disease, stage 3 unspecified (HCC) 02/11/2021   Collagenous colitis 02/11/2021   Gastroesophageal reflux disease 02/11/2021   Migraine without aura, not refractory 02/11/2021   Mild depression 02/11/2021   Osteopenia 02/11/2021   Early satiety 01/12/2021   Bloating 01/12/2021   Abdominal pain 01/12/2021    PCP: Dr Sigmund Hazel  REFERRING PROVIDER: Dr Francena Hanly   REFERRING DIAG: Rt shoulder torn RC  THERAPY DIAG:  Acute pain of right shoulder  Other symptoms and signs involving the musculoskeletal system  Abnormal posture  Muscle weakness (generalized)  Rationale for Evaluation and Treatment: Rehabilitation  ONSET DATE: 02/25/23  SUBJECTIVE:                                                                                                                                                                                       SUBJECTIVE STATEMENT: Patient reports continued Rt shoulder pain. She has continued difficulty sleeping. Working on her exercises. Feels she is getting better but still has pain.     Hand dominance: Right  PERTINENT HISTORY: Patient reports falling onto Rt shoulder 02/25/23 sustaining torn RC and biceps. She underwent surgery 03/21/23 for [facility]  repair and biceps tendon repair.   Lt shoulder scope RC, Lt 05/25/17 DCR/SAD/frayed RC/torn labrum; Rt shoulder pain; cervical pain 2018; LBP 2020; Rt knee pain 2022 - denies any medical problems   PAIN:  Are you having pain? Yes: NPRS scale: 4/10 Pain location: Rt shoulder; elbow; hand  Pain description: tightness; dull; aching; sore - occasionally sharp Aggravating factors: moving  Relieving factors: meds; avoiding movement  PRECAUTIONS: Shoulder per protocol  WEIGHT BEARING RESTRICTIONS: No  FALLS:  Has patient fallen in last 6 months? No  LIVING ENVIRONMENT: Lives with: lives alone Lives in: House/apartment Stairs: Yes: Internal: 12 steps; on right going up and External: 3 steps; on right going up Has following equipment at home: Single point cane  OCCUPATION: Retired oncology RN 2021; active with household chores, gardening, yard work    PLOF: Independent  PATIENT GOALS:get the arm working like normal again   NEXT MD VISIT: 05/03/23  OBJECTIVE:   UPPER EXTREMITY ROM:   Active ROM Right eval Left eval  Shoulder flexion    Shoulder extension    Shoulder abduction    Shoulder adduction    Shoulder internal rotation    Shoulder external rotation    Elbow flexion    Elbow extension    Wrist flexion    Wrist extension    Wrist ulnar deviation    Wrist radial deviation    Wrist pronation    Wrist supination    (Blank rows = not tested)  UPPER EXTREMITY MMT:  MMT Right eval Left eval  Shoulder flexion    Shoulder  extension    Shoulder abduction    Shoulder adduction    Shoulder internal rotation    Shoulder external rotation    Middle trapezius    Lower trapezius    Elbow flexion    Elbow extension    Wrist flexion    Wrist extension    Wrist ulnar deviation    Wrist radial deviation    Wrist pronation    Wrist supination    Grip strength (lbs)    (Blank rows = not tested)  PALPATION:  Muscular tightness Rt shoulder girdle   [facility] Adult PT Treatment:                                                DATE: 04/18/23 Therapeutic Exercise: Pendulum A/P, laterally, CW/CCW all x 20 Scap squeeze supine 10 sec x 10 Backwards shoulder rolls 3-5 sec x 10  Elbow flex/ext x 10 Pronation/supination x 10  Gentle ER foam roll along spine in standing elbow 90 deg flexion with cane ER 5 sec x 5  Rolling green swiss ball into forward flexion x 10  Manual Therapy: (patient supine) IASTM through the Rt shoulder area PROM in supine within tissue tolerance and protocol guidelines  SMT Rt upper upper quarter Self Care: Assist pt with don/doff sling  Modalities:  Vaso 34 deg; low pressure x 10 min Lt shoulder; moist heat to cervical spine    [facility] Adult PT Treatment:                                                DATE: 04/13/23 Therapeutic Exercise: Pendulum A/P, laterally, CW/CCW all x  20 Scap squeeze with noodle x 10 Backwards shoulder rolls 3-5 sec x 10  Elbow flex/ext x 10 Pronation/supination x 10  Gentle ER foam roll along spine in standing elbow 90 deg flexion with cane ER 5 sec x 5  Rolling green swiss ball into forward flexion x 10  Manual Therapy: PROM in supine within tissue tolerance and protocol guidelines  SMT Rt upper upper quarter Self Care: Assist pt with don/doff sling  Modalities:  Vaso 34 deg; low pressure x 10 min Lt shoulder     PATIENT EDUCATION: Education details: POC; HEP  Person educated: Patient Education method: Programmer, multimedia, Demonstration, Actor cues, Verbal cues,  and Handouts Education comprehension: verbalized understanding, returned demonstration, verbal cues required, tactile cues required, and needs further education  HOME EXERCISE PROGRAM:  Access Code: [email or web address] URL: [email or web address] Date: 04/04/2023 Prepared by: Corlis Leak  Exercises - Flexion-Extension Shoulder Pendulum with Table Support  - 3-4 x daily - 7 x weekly - 1 sets - 20-30 reps - Horizontal Shoulder Pendulum with Table Support  - 3-4 x daily - 7 x weekly - 1 sets - 20-30 reps - Circular Shoulder Pendulum with Table Support  - 3-4 x daily - 7 x weekly - 1 sets - 20-30 reps - Seated Elbow Flexion and Extension AROM  - 2 x daily - 7 x weekly - 1 sets - 3 reps - 3-5 sec  hold - Seated Forearm Pronation and Supination AROM  - 2 x daily - 7 x weekly - 1 sets - 10 reps - 3-5 sec  hold - Standing Shoulder Flexion AAROM with Swiss Ball  - 2 x daily - 7 x weekly - 1 sets - 10 reps - 5-10 sec  hold - Seated Scapular Retraction  - 2 x daily - 7 x weekly - 1-2 sets - 10 reps - 10 sec  hold - Standing Scapular Retraction  - 3 x daily - 7 x weekly - 1 sets - 10 reps - 10 sec  hold  ASSESSMENT:  CLINICAL IMPRESSION: Some increased pain and discomfort addressed with focus on manual work including IASTM and soft tissue mobilization. PROM/stretching wh pt supine. Continued with basis exercises for HEP. Continued treatment including exercise; manual work; PROM; stretching; vaso. Decreased pain with treatment. Continue to focus on increasing joint mobility as tolerated and protocol dictates.   OBJECTIVE IMPAIRMENTS: decreased activity tolerance, decreased mobility, decreased ROM, decreased strength, hypomobility, increased fascial restrictions, increased muscle spasms, impaired flexibility, impaired UE functional use, improper body mechanics, postural dysfunction, and pain.     GOALS: Goals reviewed with patient? Yes  SHORT TERM GOALS: Target date: 05/09/2023   Independent in  initial HEP  Baseline: Goal status: INITIAL  2.  Patient independent in donning and doffing sling  Baseline:  Goal status: INITIAL   LONG TERM GOALS: Target date: 06/20/2023   Rt shoulder ROM WFL's  Baseline:  Goal status: INITIAL  2.  Rt shoulder strength 4+/5 to 5/5 throughout  Baseline:  Goal status: INITIAL  3.  Improve posture and alignment with improved upright posture and posterior shoulder girdle engaged  Baseline:  Goal status: INITIAL  4.  Patient reports decreased pain in Rt shoulder with functional activities to no more than 2/10  Baseline:  Goal status: INITIAL  5.  Independent in HEP including aquatic program as indicated  Baseline:  Goal status: INITIAL  PLAN:  PT FREQUENCY: 2x/week  PT DURATION: 8 weeks  PLANNED INTERVENTIONS: Therapeutic exercises, Therapeutic activity, Neuromuscular re-education, Patient/Family  education, Self Care, Joint mobilization, Aquatic Therapy, Dry Needling, Electrical stimulation, Spinal mobilization, Cryotherapy, Moist heat, Taping, Vasopneumatic device, Ultrasound, Ionotophoresis 4mg /ml Dexamethasone, Manual therapy, and Re-evaluation  PLAN FOR NEXT SESSION: review and progress exercise; continue with postural correction and education; manual work,DN, modalities as indicated     Rober Minion, PT 04/18/2023, 11:46 AM

## 2023-04-19 DIAGNOSIS — F320 Major depressive disorder, single episode, mild: Secondary | ICD-10-CM | POA: Diagnosis not present

## 2023-04-19 DIAGNOSIS — B009 Herpesviral infection, unspecified: Secondary | ICD-10-CM | POA: Diagnosis not present

## 2023-04-19 DIAGNOSIS — M8580 Other specified disorders of bone density and structure, unspecified site: Secondary | ICD-10-CM | POA: Diagnosis not present

## 2023-04-19 DIAGNOSIS — N1831 Chronic kidney disease, stage 3a: Secondary | ICD-10-CM | POA: Diagnosis not present

## 2023-04-19 DIAGNOSIS — Z0000 Encounter for general adult medical examination without abnormal findings: Secondary | ICD-10-CM | POA: Diagnosis not present

## 2023-04-19 DIAGNOSIS — G43009 Migraine without aura, not intractable, without status migrainosus: Secondary | ICD-10-CM | POA: Diagnosis not present

## 2023-04-19 DIAGNOSIS — K52831 Collagenous colitis: Secondary | ICD-10-CM | POA: Diagnosis not present

## 2023-04-20 ENCOUNTER — Encounter: Payer: Self-pay | Admitting: Rehabilitative and Restorative Service Providers"

## 2023-04-20 ENCOUNTER — Ambulatory Visit: Payer: Medicare HMO | Admitting: Rehabilitative and Restorative Service Providers"

## 2023-04-20 DIAGNOSIS — M6281 Muscle weakness (generalized): Secondary | ICD-10-CM | POA: Diagnosis not present

## 2023-04-20 DIAGNOSIS — M25511 Pain in right shoulder: Secondary | ICD-10-CM | POA: Diagnosis not present

## 2023-04-20 DIAGNOSIS — R293 Abnormal posture: Secondary | ICD-10-CM

## 2023-04-20 DIAGNOSIS — R29898 Other symptoms and signs involving the musculoskeletal system: Secondary | ICD-10-CM | POA: Diagnosis not present

## 2023-04-20 NOTE — Therapy (Signed)
OUTPATIENT PHYSICAL THERAPY    Patient Name: Mary Freeman MRN: [id number] DOB:[birth year], 72 y.o., female Today's Date: 04/20/2023  END OF SESSION:  PT End of Session - 04/20/23 1105     Visit Number 8    Number of Visits 24    Date for PT Re-Evaluation 06/20/23    Authorization Type [employer] medicare $20 copay    Authorization - Visit Number 8    Progress Note Due on Visit 10    PT Start Time 1100    PT Stop Time 1149    PT Time Calculation (min) 49 min    Activity Tolerance Patient tolerated treatment well              Past Medical History:  Diagnosis Date   Depression    Headache    migraines   PONV (postoperative nausea and vomiting)    Past Surgical History:  Procedure Laterality Date   APPENDECTOMY     BREAST EXCISIONAL BIOPSY Right    benign   COLONOSCOPY     KNEE ARTHROSCOPY Right    x3   KNEE ARTHROSCOPY W/ MENISCAL REPAIR Left    LUMBAR LAMINECTOMY  1986   REDUCTION MAMMAPLASTY Bilateral    2006   ROTATOR CUFF REPAIR Right 2012   TONSILLECTOMY     TUBAL LIGATION     Patient Active Problem List   Diagnosis Date Noted   Anxiety 02/11/2021   Adjustment disorder with depressed mood 02/11/2021   Chronic kidney disease, stage 3 unspecified (HCC) 02/11/2021   Collagenous colitis 02/11/2021   Gastroesophageal reflux disease 02/11/2021   Migraine without aura, not refractory 02/11/2021   Mild depression 02/11/2021   Osteopenia 02/11/2021   Early satiety 01/12/2021   Bloating 01/12/2021   Abdominal pain 01/12/2021    PCP: Dr Sigmund Hazel  REFERRING PROVIDER: Dr Francena Hanly   REFERRING DIAG: Rt shoulder torn RC  THERAPY DIAG:  Acute pain of right shoulder  Other symptoms and signs involving the musculoskeletal system  Abnormal posture  Muscle weakness (generalized)  Rationale for Evaluation and Treatment: Rehabilitation  ONSET DATE: 02/25/23; surgery 03/21/23  SUBJECTIVE:                                                                                                                                                                                       SUBJECTIVE STATEMENT: Patient reports continued Rt shoulder pain but decreased. She has continued difficulty sleeping. Working on her exercises. Feels she is getting better but still has pain.     Hand dominance: Right  PERTINENT HISTORY: Patient reports falling onto Rt shoulder 02/25/23 sustaining torn RC and biceps. She underwent  surgery 03/21/23 for RC repair and biceps tendon repair.   Lt shoulder scope RC, Lt 05/25/17 DCR/SAD/frayed RC/torn labrum; Rt shoulder pain; cervical pain 2018; LBP 2020; Rt knee pain 2022 - denies any medical problems   PAIN:  Are you having pain? Yes: NPRS scale: 0/10 Pain location: Rt shoulder; elbow; hand  Pain description: tightness; dull; aching; sore - occasionally sharp Aggravating factors: moving  Relieving factors: meds; avoiding movement  PRECAUTIONS: Shoulder per protocol  WEIGHT BEARING RESTRICTIONS: No  FALLS:  Has patient fallen in last 6 months? No  LIVING ENVIRONMENT: Lives with: lives alone Lives in: House/apartment Stairs: Yes: Internal: 12 steps; on right going up and External: 3 steps; on right going up Has following equipment at home: Single point cane  OCCUPATION: Retired oncology RN 2021; active with household chores, gardening, yard work    PLOF: Independent  PATIENT GOALS:get the arm working like normal again   NEXT MD VISIT: 05/03/23  OBJECTIVE:   UPPER EXTREMITY ROM:   Active ROM Right eval Left eval  Shoulder flexion    Shoulder extension    Shoulder abduction    Shoulder adduction    Shoulder internal rotation    Shoulder external rotation    Elbow flexion    Elbow extension    Wrist flexion    Wrist extension    Wrist ulnar deviation    Wrist radial deviation    Wrist pronation    Wrist supination    (Blank rows = not tested)  UPPER EXTREMITY MMT:  MMT Right eval Left eval   Shoulder flexion    Shoulder extension    Shoulder abduction    Shoulder adduction    Shoulder internal rotation    Shoulder external rotation    Middle trapezius    Lower trapezius    Elbow flexion    Elbow extension    Wrist flexion    Wrist extension    Wrist ulnar deviation    Wrist radial deviation    Wrist pronation    Wrist supination    Grip strength (lbs)    (Blank rows = not tested)  PALPATION:  Muscular tightness Rt shoulder girdle   [facility] Adult PT Treatment:                                                DATE: 04/20/23 Therapeutic Exercise: Pendulum A/P, laterally, CW/CCW all x 20 Scap squeeze supine 10 sec x 10 Backwards shoulder rolls 3-5 sec x 10  Elbow flex/ext x 10 Pronation/supination x 10  Gentle ER foam roll along spine in standing elbow 90 deg flexion with cane ER 5 sec x 5  Rolling green swiss ball into forward flexion x 10  Manual Therapy: (patient supine) IASTM through the Rt shoulder area PROM in supine within tissue tolerance and protocol guidelines  SMT Rt upper upper quarter Self Care: Assist pt with don/doff sling  Modalities:  Vaso 34 deg; low pressure x 10 min Lt shoulder; moist heat to cervical spine    [facility] Adult PT Treatment:                                                DATE: 04/18/23 Therapeutic Exercise: Pendulum A/P,  laterally, CW/CCW all x 20 Scap squeeze supine 10 sec x 10 Backwards shoulder rolls 3-5 sec x 10  Elbow flex/ext x 10 Pronation/supination x 10  Gentle ER foam roll along spine in standing elbow 90 deg flexion with cane ER 5 sec x 5  Standing shoulder flexion - rolling green swiss ball on treatment table x 10  Supine scap squeeze 10 sec x 10  Manual Therapy: (patient supine) IASTM through the Rt shoulder area including pecs, biceps, triceps, deltoid, teres SMT Rt upper upper quarter PROM in supine within tissue tolerance and protocol guidelines  Self Care: Assist pt with replace sling post treatment Modalities:   Vaso 34 deg; medium pressure x 10 min Lt shoulder; moist heat to cervical spine     PATIENT EDUCATION: Education details: POC; HEP  Person educated: Patient Education method: Programmer, multimedia, Demonstration, Tactile cues, Verbal cues, and Handouts Education comprehension: verbalized understanding, returned demonstration, verbal cues required, tactile cues required, and needs further education  HOME EXERCISE PROGRAM:  Access Code: [email or web address] URL: [email or web address] Date: 04/04/2023 Prepared by: Corlis Leak  Exercises - Flexion-Extension Shoulder Pendulum with Table Support  - 3-4 x daily - 7 x weekly - 1 sets - 20-30 reps - Horizontal Shoulder Pendulum with Table Support  - 3-4 x daily - 7 x weekly - 1 sets - 20-30 reps - Circular Shoulder Pendulum with Table Support  - 3-4 x daily - 7 x weekly - 1 sets - 20-30 reps - Seated Elbow Flexion and Extension AROM  - 2 x daily - 7 x weekly - 1 sets - 3 reps - 3-5 sec  hold - Seated Forearm Pronation and Supination AROM  - 2 x daily - 7 x weekly - 1 sets - 10 reps - 3-5 sec  hold - Standing Shoulder Flexion AAROM with Swiss Ball  - 2 x daily - 7 x weekly - 1 sets - 10 reps - 5-10 sec  hold - Seated Scapular Retraction  - 2 x daily - 7 x weekly - 1-2 sets - 10 reps - 10 sec  hold - Standing Scapular Retraction  - 3 x daily - 7 x weekly - 1 sets - 10 reps - 10 sec  hold  ASSESSMENT:  CLINICAL IMPRESSION: Excellent response to increased focus on manual work including IASTM last visit. Patient more tolerant of PROM with lee muscle guarding . PROM/stretching wh pt supine. Continued treatment including exercise; manual work; PROM; stretching; vaso. Decreased pain with treatment. Continue to focus on increasing joint mobility as tolerated and protocol dictates.  RTD 05/02/23  OBJECTIVE IMPAIRMENTS: decreased activity tolerance, decreased mobility, decreased ROM, decreased strength, hypomobility, increased fascial restrictions, increased  muscle spasms, impaired flexibility, impaired UE functional use, improper body mechanics, postural dysfunction, and pain.     GOALS: Goals reviewed with patient? Yes  SHORT TERM GOALS: Target date: 05/09/2023   Independent in initial HEP  Baseline: Goal status: INITIAL  2.  Patient independent in donning and doffing sling  Baseline:  Goal status: INITIAL   LONG TERM GOALS: Target date: 06/20/2023   Rt shoulder ROM WFL's  Baseline:  Goal status: INITIAL  2.  Rt shoulder strength 4+/5 to 5/5 throughout  Baseline:  Goal status: INITIAL  3.  Improve posture and alignment with improved upright posture and posterior shoulder girdle engaged  Baseline:  Goal status: INITIAL  4.  Patient reports decreased pain in Rt shoulder with functional activities to no more than 2/10  Baseline:  Goal status: INITIAL  5.  Independent in HEP including aquatic program as indicated  Baseline:  Goal status: INITIAL  PLAN:  PT FREQUENCY: 2x/week  PT DURATION: 8 weeks  PLANNED INTERVENTIONS: Therapeutic exercises, Therapeutic activity, Neuromuscular re-education, Patient/Family education, Self Care, Joint mobilization, Aquatic Therapy, Dry Needling, Electrical stimulation, Spinal mobilization, Cryotherapy, Moist heat, Taping, Vasopneumatic device, Ultrasound, Ionotophoresis 4mg /ml Dexamethasone, Manual therapy, and Re-evaluation  PLAN FOR NEXT SESSION: review and progress exercise; continue with postural correction and education; manual work,DN, modalities as indicated    [employer], PT 04/20/2023, 11:05 AM

## 2023-04-25 ENCOUNTER — Ambulatory Visit: Payer: Medicare HMO | Admitting: Rehabilitative and Restorative Service Providers"

## 2023-04-25 ENCOUNTER — Encounter: Payer: Self-pay | Admitting: Rehabilitative and Restorative Service Providers"

## 2023-04-25 DIAGNOSIS — M6281 Muscle weakness (generalized): Secondary | ICD-10-CM

## 2023-04-25 DIAGNOSIS — M25511 Pain in right shoulder: Secondary | ICD-10-CM | POA: Diagnosis not present

## 2023-04-25 DIAGNOSIS — R29898 Other symptoms and signs involving the musculoskeletal system: Secondary | ICD-10-CM | POA: Diagnosis not present

## 2023-04-25 DIAGNOSIS — R293 Abnormal posture: Secondary | ICD-10-CM

## 2023-04-25 NOTE — Therapy (Signed)
OUTPATIENT PHYSICAL THERAPY    Patient Name: Mary Freeman MRN: [id number] DOB:November 23, 1951, 72 y.o., female Today's Date: 04/25/2023  END OF SESSION:  PT End of Session - 04/25/23 0926     Visit Number 9    Number of Visits 24    Date for PT Re-Evaluation 06/20/23    Authorization Type [employer] $20 copay    Authorization - Visit Number 9    Progress Note Due on Visit 10    PT Start Time 0925    PT Stop Time 1015    PT Time Calculation (min) 50 min    Activity Tolerance Patient tolerated treatment well              Past Medical History:  Diagnosis Date   Depression    Headache    migraines   PONV (postoperative nausea and vomiting)    Past Surgical History:  Procedure Laterality Date   APPENDECTOMY     BREAST EXCISIONAL BIOPSY Right    benign   COLONOSCOPY     KNEE ARTHROSCOPY Right    x3   KNEE ARTHROSCOPY W/ MENISCAL REPAIR Left    LUMBAR LAMINECTOMY  1986   REDUCTION MAMMAPLASTY Bilateral    2006   ROTATOR CUFF REPAIR Right 2012   TONSILLECTOMY     TUBAL LIGATION     Patient Active Problem List   Diagnosis Date Noted   Anxiety 02/11/2021   Adjustment disorder with depressed mood 02/11/2021   Chronic kidney disease, stage 3 unspecified (HCC) 02/11/2021   Collagenous colitis 02/11/2021   Gastroesophageal reflux disease 02/11/2021   Migraine without aura, not refractory 02/11/2021   Mild depression 02/11/2021   Osteopenia 02/11/2021   Early satiety 01/12/2021   Bloating 01/12/2021   Abdominal pain 01/12/2021    PCP: Dr Sigmund Hazel  REFERRING PROVIDER: Dr Francena Hanly   REFERRING DIAG: Rt shoulder torn RC  THERAPY DIAG:  Acute pain of right shoulder  Other symptoms and signs involving the musculoskeletal system  Abnormal posture  Muscle weakness (generalized)  Rationale for Evaluation and Treatment: Rehabilitation  ONSET DATE: 02/25/23; surgery 03/21/23  SUBJECTIVE:                                                                                                                                                                                       SUBJECTIVE STATEMENT: Patient reports continued Rt shoulder pain which was worse yesterday. She has continued difficulty sleeping. Working on her exercises.  Hand dominance: Right  PERTINENT HISTORY: Patient reports falling onto Rt shoulder 02/25/23 sustaining torn RC and biceps. She underwent surgery 03/21/23 for [facility] repair and biceps tendon repair.  Lt shoulder scope RC, Lt 05/25/17 DCR/SAD/frayed RC/torn labrum; Rt shoulder pain; cervical pain 2018; LBP 2020; Rt knee pain 2022 - denies any medical problems   PAIN:  Are you having pain? Yes: NPRS scale: 4/10 Pain location: Rt shoulder; elbow; hand  Pain description: tightness; dull; aching; sore - occasionally sharp Aggravating factors: moving  Relieving factors: meds; avoiding movement  PRECAUTIONS: Shoulder per protocol  WEIGHT BEARING RESTRICTIONS: No  FALLS:  Has patient fallen in last 6 months? No  LIVING ENVIRONMENT: Lives with: lives alone Lives in: House/apartment Stairs: Yes: Internal: 12 steps; on right going up and External: 3 steps; on right going up Has following equipment at home: Single point cane  OCCUPATION: Retired oncology RN 2021; active with household chores, gardening, yard work    PLOF: Independent  PATIENT GOALS:get the arm working like normal again   NEXT MD VISIT: 05/03/23  OBJECTIVE:   UPPER EXTREMITY ROM:   Active ROM Right eval Left eval  Shoulder flexion    Shoulder extension    Shoulder abduction    Shoulder adduction    Shoulder internal rotation    Shoulder external rotation    Elbow flexion    Elbow extension    Wrist flexion    Wrist extension    Wrist ulnar deviation    Wrist radial deviation    Wrist pronation    Wrist supination    (Blank rows = not tested)  UPPER EXTREMITY MMT:  MMT Right eval Left eval  Shoulder flexion    Shoulder extension     Shoulder abduction    Shoulder adduction    Shoulder internal rotation    Shoulder external rotation    Middle trapezius    Lower trapezius    Elbow flexion    Elbow extension    Wrist flexion    Wrist extension    Wrist ulnar deviation    Wrist radial deviation    Wrist pronation    Wrist supination    Grip strength (lbs)    (Blank rows = not tested)  PALPATION:  Muscular tightness Rt shoulder girdle   [facility] Adult PT Treatment:                                                DATE: 04/25/23 Therapeutic Exercise: Pendulum A/P, laterally, CW/CCW all x 20 Scap squeeze supine 10 sec x 10 Backwards shoulder rolls 3-5 sec x 10  Elbow flex/ext x 10 Pronation/supination x 10  Gentle ER foam roll along spine in standing elbow 90 deg flexion with cane ER 5 sec x 5  Rolling green swiss ball into forward flexion x 10  Manual Therapy: (patient supine) IASTM through the Rt shoulder area PROM in supine within tissue tolerance and protocol guidelines  SMT Rt upper upper quarter Self Care: Assist pt with don/doff sling  Modalities:  Vaso 34 deg; medium pressure x 10 min Lt shoulder; moist heat to cervical spine    [facility] Adult PT Treatment:                                                DATE: 04/20/23 Therapeutic Exercise: Pendulum A/P, laterally, CW/CCW all x 20 Scap squeeze supine 10 sec x  10 Backwards shoulder rolls 3-5 sec x 10  Elbow flex/ext x 10 Pronation/supination x 10  Gentle ER foam roll along spine in standing elbow 90 deg flexion with cane ER 5 sec x 5  Rolling green swiss ball into forward flexion x 10  Manual Therapy: (patient supine) IASTM through the Rt shoulder area PROM in supine within tissue tolerance and protocol guidelines  SMT Rt upper upper quarter Self Care: Assist pt with don/doff sling  Modalities:  Vaso 34 deg; low pressure x 10 min Lt shoulder; moist heat to cervical spine     PATIENT EDUCATION: Education details: POC; HEP  Person educated:  Patient Education method: Programmer, multimedia, Demonstration, Tactile cues, Verbal cues, and Handouts Education comprehension: verbalized understanding, returned demonstration, verbal cues required, tactile cues required, and needs further education  HOME EXERCISE PROGRAM:  Access Code: [email or web address] URL: [email or web address] Date: 04/04/2023 Prepared by: Corlis Leak  Exercises - Flexion-Extension Shoulder Pendulum with Table Support  - 3-4 x daily - 7 x weekly - 1 sets - 20-30 reps - Horizontal Shoulder Pendulum with Table Support  - 3-4 x daily - 7 x weekly - 1 sets - 20-30 reps - Circular Shoulder Pendulum with Table Support  - 3-4 x daily - 7 x weekly - 1 sets - 20-30 reps - Seated Elbow Flexion and Extension AROM  - 2 x daily - 7 x weekly - 1 sets - 3 reps - 3-5 sec  hold - Seated Forearm Pronation and Supination AROM  - 2 x daily - 7 x weekly - 1 sets - 10 reps - 3-5 sec  hold - Standing Shoulder Flexion AAROM with Swiss Ball  - 2 x daily - 7 x weekly - 1 sets - 10 reps - 5-10 sec  hold - Seated Scapular Retraction  - 2 x daily - 7 x weekly - 1-2 sets - 10 reps - 10 sec  hold - Standing Scapular Retraction  - 3 x daily - 7 x weekly - 1 sets - 10 reps - 10 sec  hold  ASSESSMENT:  CLINICAL IMPRESSION: Increased pain yesterday and today - most likely due to increase in activity(grocery shopping, friend visiting, etc) Good response to continued focus on manual work including IASTM. Patient more tolerant of PROM with less muscle guarding . PROM/stretching with pt supine. Continued treatment including exercise; manual work; PROM; stretching; vaso. Decreased pain with treatment. Continue to focus on increasing joint mobility within parameters of protocol.  RTD 05/02/23  OBJECTIVE IMPAIRMENTS: decreased activity tolerance, decreased mobility, decreased ROM, decreased strength, hypomobility, increased fascial restrictions, increased muscle spasms, impaired flexibility, impaired UE functional  use, improper body mechanics, postural dysfunction, and pain.     GOALS: Goals reviewed with patient? Yes  SHORT TERM GOALS: Target date: 05/09/2023   Independent in initial HEP  Baseline: Goal status: INITIAL  2.  Patient independent in donning and doffing sling  Baseline:  Goal status: INITIAL   LONG TERM GOALS: Target date: 06/20/2023   Rt shoulder ROM WFL's  Baseline:  Goal status: INITIAL  2.  Rt shoulder strength 4+/5 to 5/5 throughout  Baseline:  Goal status: INITIAL  3.  Improve posture and alignment with improved upright posture and posterior shoulder girdle engaged  Baseline:  Goal status: INITIAL  4.  Patient reports decreased pain in Rt shoulder with functional activities to no more than 2/10  Baseline:  Goal status: INITIAL  5.  Independent in HEP including aquatic program as indicated  Baseline:  Goal status:  INITIAL  PLAN:  PT FREQUENCY: 2x/week  PT DURATION: 8 weeks  PLANNED INTERVENTIONS: Therapeutic exercises, Therapeutic activity, Neuromuscular re-education, Patient/Family education, Self Care, Joint mobilization, Aquatic Therapy, Dry Needling, Electrical stimulation, Spinal mobilization, Cryotherapy, Moist heat, Taping, Vasopneumatic device, Ultrasound, Ionotophoresis 4mg /ml Dexamethasone, Manual therapy, and Re-evaluation  PLAN FOR NEXT SESSION: review and progress exercise; continue with postural correction and education; manual work,DN, modalities as indicated     Rober Minion, PT 04/25/2023, 9:26 AM

## 2023-04-27 ENCOUNTER — Ambulatory Visit: Payer: Medicare HMO

## 2023-05-02 ENCOUNTER — Ambulatory Visit: Payer: Medicare HMO | Admitting: Rehabilitative and Restorative Service Providers"

## 2023-05-03 DIAGNOSIS — N289 Disorder of kidney and ureter, unspecified: Secondary | ICD-10-CM | POA: Diagnosis not present

## 2023-05-04 ENCOUNTER — Ambulatory Visit: Payer: Medicare HMO | Attending: Orthopedic Surgery | Admitting: Rehabilitative and Restorative Service Providers"

## 2023-05-04 ENCOUNTER — Encounter: Payer: Self-pay | Admitting: Rehabilitative and Restorative Service Providers"

## 2023-05-04 DIAGNOSIS — M6281 Muscle weakness (generalized): Secondary | ICD-10-CM | POA: Insufficient documentation

## 2023-05-04 DIAGNOSIS — M25511 Pain in right shoulder: Secondary | ICD-10-CM | POA: Insufficient documentation

## 2023-05-04 DIAGNOSIS — R293 Abnormal posture: Secondary | ICD-10-CM | POA: Insufficient documentation

## 2023-05-04 DIAGNOSIS — R29898 Other symptoms and signs involving the musculoskeletal system: Secondary | ICD-10-CM | POA: Insufficient documentation

## 2023-05-04 NOTE — Therapy (Signed)
OUTPATIENT PHYSICAL THERAPY SHOULDER TREATMENT AND MEDICARE 10th VISIT NOTE   Patient Name: Mary Freeman MRN: [id number] DOB:September 13, 1951, 72 y.o., female Today's Date: 05/04/2023  END OF SESSION:  PT End of Session - 05/04/23 1145     Visit Number 10    Number of Visits 24    Date for PT Re-Evaluation 06/20/23    Authorization Type [employer] medicare $20 copay    Authorization - Visit Number 10    Progress Note Due on Visit 10    PT Start Time 1145    PT Stop Time 1235    PT Time Calculation (min) 50 min    Activity Tolerance Patient tolerated treatment well              Past Medical History:  Diagnosis Date   Depression    Headache    migraines   PONV (postoperative nausea and vomiting)    Past Surgical History:  Procedure Laterality Date   APPENDECTOMY     BREAST EXCISIONAL BIOPSY Right    benign   COLONOSCOPY     KNEE ARTHROSCOPY Right    x3   KNEE ARTHROSCOPY W/ MENISCAL REPAIR Left    LUMBAR LAMINECTOMY  1986   REDUCTION MAMMAPLASTY Bilateral    2006   ROTATOR CUFF REPAIR Right 2012   TONSILLECTOMY     TUBAL LIGATION     Patient Active Problem List   Diagnosis Date Noted   Anxiety 02/11/2021   Adjustment disorder with depressed mood 02/11/2021   Chronic kidney disease, stage 3 unspecified (HCC) 02/11/2021   Collagenous colitis 02/11/2021   Gastroesophageal reflux disease 02/11/2021   Migraine without aura, not refractory 02/11/2021   Mild depression 02/11/2021   Osteopenia 02/11/2021   Early satiety 01/12/2021   Bloating 01/12/2021   Abdominal pain 01/12/2021    PCP: Dr Sigmund Hazel  REFERRING PROVIDER: Dr Francena Hanly   REFERRING DIAG: Rt shoulder torn RC  THERAPY DIAG:  Acute pain of right shoulder  Other symptoms and signs involving the musculoskeletal system  Abnormal posture  Muscle weakness (generalized)  Rationale for Evaluation and Treatment: Rehabilitation  ONSET DATE: 02/25/23; surgery 03/21/23  SUBJECTIVE:                                                                                                                                                                                       SUBJECTIVE STATEMENT: Saw Dr Rennis Chris yesterday and he is pleased with progress and is ready for her to discontinue the sling and progress with exercises. Pain has changed since she took the sling off. Now feels some pain in the shoulder no longer pain in the  forearm. Has a pulley for home.   Hand dominance: Right  PERTINENT HISTORY: Patient reports falling onto Rt shoulder 02/25/23 sustaining torn RC and biceps. She underwent surgery 03/21/23 for [facility] repair and biceps tendon repair.   Lt shoulder scope RC, Lt 05/25/17 DCR/SAD/frayed RC/torn labrum; Rt shoulder pain; cervical pain 2018; LBP 2020; Rt knee pain 2022 - denies any medical problems   PAIN:  Are you having pain? Yes: NPRS scale: 5/10 Pain location: Rt shoulder; elbow; hand  Pain description: tightness; dull; aching; sore - occasionally sharp Aggravating factors: moving  Relieving factors: meds; avoiding movement  PRECAUTIONS: Shoulder per protocol  WEIGHT BEARING RESTRICTIONS: No  FALLS:  Has patient fallen in last 6 months? No  LIVING ENVIRONMENT: Lives with: lives alone Lives in: House/apartment Stairs: Yes: Internal: 12 steps; on right going up and External: 3 steps; on right going up Has following equipment at home: Single point cane  OCCUPATION: Retired oncology RN 2021; active with household chores, gardening, yard work    PLOF: Independent  PATIENT GOALS:get the arm working like normal again   NEXT MD VISIT: 05/03/23  OBJECTIVE:   UPPER EXTREMITY ROM:   Active ROM Right eval Left eval  Shoulder flexion    Shoulder extension    Shoulder abduction    Shoulder adduction    Shoulder internal rotation    Shoulder external rotation    Elbow flexion    Elbow extension    Wrist flexion    Wrist extension    Wrist ulnar deviation    Wrist radial  deviation    Wrist pronation    Wrist supination    (Blank rows = not tested)  UPPER EXTREMITY MMT:  MMT Right eval Left eval  Shoulder flexion    Shoulder extension    Shoulder abduction    Shoulder adduction    Shoulder internal rotation    Shoulder external rotation    Middle trapezius    Lower trapezius    Elbow flexion    Elbow extension    Wrist flexion    Wrist extension    Wrist ulnar deviation    Wrist radial deviation    Wrist pronation    Wrist supination    Grip strength (lbs)    (Blank rows = not tested)  PALPATION:  Muscular tightness Rt shoulder girdle  [facility] Adult PT Treatment:                                                DATE: 05/04/23 Therapeutic Exercise: Pendulum A/P, laterally, CW/CCW all x 20 Pulley flexion 10 sec x 10  Pulley scaption 10 sec x 10  Pulley horizontal ab/ad at ~ 90 deg elevation Shoulder alphabet ball supported on table  ER with cane standing 10 sec x 10  Scap squeeze supine 10 sec x 10 Backwards shoulder rolls 3-5 sec x 10  Elbow flex/ext x 10 Pronation/supination x 10  Rolling green swiss ball into forward flexion x 10  Manual Therapy: (patient supine) IASTM through the Rt shoulder area PROM in supine within tissue tolerance and protocol guidelines  SMT Rt upper upper quarter Self Care: Assist pt with don/doff sling  Modalities:  Vaso 34 deg; medium pressure x 10 min Lt shoulder; moist heat to cervical spine    [facility] Adult PT Treatment:  DATE: 04/25/23 Therapeutic Exercise: Pendulum A/P, laterally, CW/CCW all x 20 Scap squeeze supine 10 sec x 10 Backwards shoulder rolls 3-5 sec x 10  Elbow flex/ext x 10 Pronation/supination x 10  Gentle ER foam roll along spine in standing elbow 90 deg flexion with cane ER 5 sec x 5  Rolling green swiss ball into forward flexion x 10  Manual Therapy: (patient supine) IASTM through the Rt shoulder area PROM in supine within tissue tolerance  and protocol guidelines  SMT Rt upper upper quarter Self Care: Assist pt with don/doff sling  Modalities:  Vaso 34 deg; medium pressure x 10 min Lt shoulder; moist heat to cervical spine     PATIENT EDUCATION: Education details: POC; HEP  Person educated: Patient Education method: Programmer, multimedia, Demonstration, Tactile cues, Verbal cues, and Handouts Education comprehension: verbalized understanding, returned demonstration, verbal cues required, tactile cues required, and needs further education  HOME EXERCISE PROGRAM:  Access Code: [email or web address] URL: [email or web address] Date: 05/04/2023 Prepared by: Corlis Leak  Exercises - Flexion-Extension Shoulder Pendulum with Table Support  - 3-4 x daily - 7 x weekly - 1 sets - 20-30 reps - Horizontal Shoulder Pendulum with Table Support  - 3-4 x daily - 7 x weekly - 1 sets - 20-30 reps - Circular Shoulder Pendulum with Table Support  - 3-4 x daily - 7 x weekly - 1 sets - 20-30 reps - Seated Elbow Flexion and Extension AROM  - 2 x daily - 7 x weekly - 1 sets - 3 reps - 3-5 sec  hold - Seated Forearm Pronation and Supination AROM  - 2 x daily - 7 x weekly - 1 sets - 10 reps - 3-5 sec  hold - Standing Shoulder Flexion AAROM with Swiss Ball  - 2 x daily - 7 x weekly - 1 sets - 10 reps - 5-10 sec  hold - Seated Scapular Retraction  - 2 x daily - 7 x weekly - 1-2 sets - 10 reps - 10 sec  hold - Standing Scapular Retraction  - 3 x daily - 7 x weekly - 1 sets - 10 reps - 10 sec  hold - Seated Shoulder Flexion AAROM with Pulley Behind  - 2 x daily - 7 x weekly - 1 sets - 10 reps - 10 sec  hold - Seated Shoulder Scaption AAROM with Pulley at Side  - 2 x daily - 7 x weekly - 1 sets - 10 reps - 10sec  hold - Seated Shoulder External Rotation AAROM with Cane and Hand in Neutral  - 2 x daily - 7 x weekly - 1 sets - 5-10 reps - 5-10 sec  hold - Shoulder Alphabet with Ball at Counter  - 2 x daily - 7 x weekly - 1 sets  ASSESSMENT:  CLINICAL  IMPRESSION: Patient seen by MD yesterday and is OK to discontinue sling and progress exercises. Has some pain in the R shoulder, less in the forearm. Continued difficulty sleeping. Progressed with AAROM exercises and closed chain activities for HEP. PROM/stretching with pt supine. Continued treatment including exercise; manual work; PROM; stretching; vaso. Decreased pain with treatment. Continue to focus on increasing joint mobility within parameters of protocol. Progressing well toward stated goals of therapy.    RTD 06/07/23  OBJECTIVE IMPAIRMENTS: decreased activity tolerance, decreased mobility, decreased ROM, decreased strength, hypomobility, increased fascial restrictions, increased muscle spasms, impaired flexibility, impaired UE functional use, improper body mechanics, postural dysfunction, and pain.     GOALS: Goals reviewed with  patient? Yes  SHORT TERM GOALS: Target date: 05/09/2023   Independent in initial HEP  Baseline: Goal status: INITIAL  2.  Patient independent in donning and doffing sling  Baseline:  Goal status: INITIAL   LONG TERM GOALS: Target date: 06/20/2023   Rt shoulder ROM WFL's  Baseline:  Goal status: INITIAL  2.  Rt shoulder strength 4+/5 to 5/5 throughout  Baseline:  Goal status: INITIAL  3.  Improve posture and alignment with improved upright posture and posterior shoulder girdle engaged  Baseline:  Goal status: INITIAL  4.  Patient reports decreased pain in Rt shoulder with functional activities to no more than 2/10  Baseline:  Goal status: INITIAL  5.  Independent in HEP including aquatic program as indicated  Baseline:  Goal status: INITIAL  PLAN:  PT FREQUENCY: 2x/week  PT DURATION: 8 weeks  PLANNED INTERVENTIONS: Therapeutic exercises, Therapeutic activity, Neuromuscular re-education, Patient/Family education, Self Care, Joint mobilization, Aquatic Therapy, Dry Needling, Electrical stimulation, Spinal mobilization, Cryotherapy,  Moist heat, Taping, Vasopneumatic device, Ultrasound, Ionotophoresis 4mg /ml Dexamethasone, Manual therapy, and Re-evaluation  PLAN FOR NEXT SESSION: review and progress exercise; continue with postural correction and education; manual work,DN, modalities as indicated     Rober Minion, PT 05/04/2023, 11:46 AM

## 2023-05-09 ENCOUNTER — Encounter: Payer: Self-pay | Admitting: Rehabilitative and Restorative Service Providers"

## 2023-05-09 ENCOUNTER — Ambulatory Visit: Payer: Medicare HMO | Admitting: Rehabilitative and Restorative Service Providers"

## 2023-05-09 DIAGNOSIS — R293 Abnormal posture: Secondary | ICD-10-CM

## 2023-05-09 DIAGNOSIS — M6281 Muscle weakness (generalized): Secondary | ICD-10-CM | POA: Diagnosis not present

## 2023-05-09 DIAGNOSIS — M25511 Pain in right shoulder: Secondary | ICD-10-CM

## 2023-05-09 DIAGNOSIS — R29898 Other symptoms and signs involving the musculoskeletal system: Secondary | ICD-10-CM | POA: Diagnosis not present

## 2023-05-09 NOTE — Therapy (Signed)
OUTPATIENT PHYSICAL THERAPY SHOULDER TREATMENT    Patient Name: Mary Freeman MRN: [id number] DOB:17-Mar-1951, 72 y.o., female Today's Date: 05/09/2023  END OF SESSION:  PT End of Session - 05/09/23 1100     Visit Number 11    Number of Visits 24    Date for PT Re-Evaluation 06/20/23    Authorization Type [employer] medic[employer] $20 copay    Authorization - Visit Number 11    Progress Note Due on Visit 20    PT Start Time 1059    PT Stop Time 1147    PT Time Calculation (min) 48 min    Activity Tolerance Patient tolerated treatment well              Past Medical History:  Diagnosis Date   Depression    Headache    migraines   PONV (postoperative nausea and vomiting)    Past Surgical History:  Procedure Laterality Date   APPENDECTOMY     BREAST EXCISIONAL BIOPSY Right    benign   COLONOSCOPY     KNEE ARTHROSCOPY Right    x3   KNEE ARTHROSCOPY W/ MENISCAL REPAIR Left    LUMBAR LAMINECTOMY  1986   REDUCTION MAMMAPLASTY Bilateral    2006   ROTATOR CUFF REPAIR Right 2012   TONSILLECTOMY     TUBAL LIGATION     Patient Active Problem List   Diagnosis Date Noted   Anxiety 02/11/2021   Adjustment disorder with depressed mood 02/11/2021   Chronic kidney disease, stage 3 unspecified (HCC) 02/11/2021   Collagenous colitis 02/11/2021   Gastroesophageal reflux disease 02/11/2021   Migraine without aura, not refractory 02/11/2021   Mild depression 02/11/2021   Osteopenia 02/11/2021   Early satiety 01/12/2021   Bloating 01/12/2021   Abdominal pain 01/12/2021    PCP: Dr Sigmund Hazel  REFERRING PROVIDER: Dr Francena Hanly   REFERRING DIAG: Rt shoulder torn RC  THERAPY DIAG:  Acute pain of right shoulder  Other symptoms and signs involving the musculoskeletal system  Abnormal posture  Muscle weakness (generalized)  Rationale for Evaluation and Treatment: Rehabilitation  ONSET DATE: 02/25/23; surgery 03/21/23  SUBJECTIVE:                                                                                                                                                                                       SUBJECTIVE STATEMENT: Patient reports that she has no pain in the Rt shoulder some days and other days it is a "tight" feeling. She is working on her exercises and using ice at home. She is careful with how she uses her arm.    Hand dominance: Right  PERTINENT HISTORY: Patient reports falling onto Rt shoulder 02/25/23 sustaining torn RC and biceps. She underwent surgery 03/21/23 for [facility] repair and biceps tendon repair.   Lt shoulder scope RC, Lt 05/25/17 DCR/SAD/frayed RC/torn labrum; Rt shoulder pain; cervical pain 2018; LBP 2020; Rt knee pain 2022 - denies any medical problems   PAIN:  Are you having pain? Yes: NPRS scale: 4/10 Pain location: Rt shoulder; elbow; hand  Pain description: tightness; dull; aching; sore - occasionally sharp Aggravating factors: moving  Relieving factors: meds; avoiding movement  PRECAUTIONS: Shoulder per protocol  WEIGHT BEARING RESTRICTIONS: No  FALLS:  Has patient fallen in last 6 months? No  LIVING ENVIRONMENT: Lives with: lives alone Lives in: House/apartment Stairs: Yes: Internal: 12 steps; on right going up and External: 3 steps; on right going up Has following equipment at home: Single point cane  OCCUPATION: Retired oncology RN 2021; active with household chores, gardening, yard work    PLOF: Independent  PATIENT GOALS:get the arm working like normal again   NEXT MD VISIT: 05/03/23  OBJECTIVE:   UPPER EXTREMITY ROM:   Active ROM Right eval Left eval  Shoulder flexion    Shoulder extension    Shoulder abduction    Shoulder adduction    Shoulder internal rotation    Shoulder external rotation    Elbow flexion    Elbow extension    Wrist flexion    Wrist extension    Wrist ulnar deviation    Wrist radial deviation    Wrist pronation    Wrist supination    (Blank rows = not tested)  UPPER  EXTREMITY MMT:  MMT Right eval Left eval  Shoulder flexion    Shoulder extension    Shoulder abduction    Shoulder adduction    Shoulder internal rotation    Shoulder external rotation    Middle trapezius    Lower trapezius    Elbow flexion    Elbow extension    Wrist flexion    Wrist extension    Wrist ulnar deviation    Wrist radial deviation    Wrist pronation    Wrist supination    Grip strength (lbs)    (Blank rows = not tested)  PALPATION:  Muscular tightness Rt shoulder girdle   [facility] Adult PT Treatment:                                                DATE: 05/09/23 Therapeutic Exercise: Pendulum A/P, laterally, CW/CCW all x 20 Pulley flexion 10 sec x 10  Pulley scaption 10 sec x 10  Pulley horizontal ab/ad at ~ 90 deg elevation Shoulder alphabet ball supported on table  ER with cane standing 10 sec x 10  Scap squeeze supine 10 sec x 10 Backwards shoulder rolls 3-5 sec x 10  Elbow flex/ext x 10 Pronation/supination x 10  Rolling green swiss ball into forward flexion x 10  Prone shoudler extension 3 sec x 10  Prone row 3 sec x 10  Biceps curl 1# x 10  Triceps extension supine 1# x 10  Manual Therapy: (patient supine) IASTM through the Rt shoulder area PROM in supine within tissue tolerance and protocol guidelines  SMT Rt upper upper quarter Modalities:  Vaso 34 deg; medium pressure x 10 min Lt shoulder; moist heat to cervical spine   [facility] Adult PT Treatment:  DATE: 05/04/23 Therapeutic Exercise: Pendulum A/P, laterally, CW/CCW all x 20 Pulley flexion 10 sec x 10  Pulley scaption 10 sec x 10  Pulley horizontal ab/ad at ~ 90 deg elevation Shoulder alphabet ball supported on table  ER with cane standing 10 sec x 10  Scap squeeze supine 10 sec x 10 Backwards shoulder rolls 3-5 sec x 10  Elbow flex/ext x 10 Pronation/supination x 10  Rolling green swiss ball into forward flexion x 10  Manual Therapy: (patient  supine) IASTM through the Rt shoulder area PROM in supine within tissue tolerance and protocol guidelines  SMT Rt upper upper quarter Self Care: Assist pt with don/doff sling  Modalities:  Vaso 34 deg; medium pressure x 10 min Lt shoulder; moist heat to cervical spine     PATIENT EDUCATION: Education details: POC; HEP  Person educated: Patient Education method: Programmer, multimedia, Demonstration, Tactile cues, Verbal cues, and Handouts Education comprehension: verbalized understanding, returned demonstration, verbal cues required, tactile cues required, and needs further education  HOME EXERCISE PROGRAM:  Access Code: [email or web address] URL: [email or web address] Date: 05/09/2023 Prepared by: Corlis Leak  Exercises - Flexion-Extension Shoulder Pendulum with Table Support  - 3-4 x daily - 7 x weekly - 1 sets - 20-30 reps - Horizontal Shoulder Pendulum with Table Support  - 3-4 x daily - 7 x weekly - 1 sets - 20-30 reps - Circular Shoulder Pendulum with Table Support  - 3-4 x daily - 7 x weekly - 1 sets - 20-30 reps - Standing Shoulder Flexion AAROM with Swiss Ball  - 2 x daily - 7 x weekly - 1 sets - 10 reps - 5-10 sec  hold - Seated Scapular Retraction  - 2 x daily - 7 x weekly - 1-2 sets - 10 reps - 10 sec  hold - Standing Scapular Retraction  - 3 x daily - 7 x weekly - 1 sets - 10 reps - 10 sec  hold - Seated Shoulder Flexion AAROM with Pulley Behind  - 2 x daily - 7 x weekly - 1 sets - 10 reps - 10 sec  hold - Seated Shoulder Scaption AAROM with Pulley at Side  - 2 x daily - 7 x weekly - 1 sets - 10 reps - 10sec  hold - Seated Shoulder External Rotation AAROM with Cane and Hand in Neutral  - 2 x daily - 7 x weekly - 1 sets - 5-10 reps - 5-10 sec  hold - Shoulder Alphabet with Ball at Counter  - 2 x daily - 7 x weekly - 1 sets - Prone Shoulder Extension - Single Arm  - 1 x daily - 7 x weekly - 1-2 sets - 10 reps - 3  sec  hold - Prone Shoulder Row  - 1 x daily - 7 x weekly - 1-2 sets  - 10 reps - 3 sec  hold - Seated Single Arm Bicep Curls Supinated with Dumbbell  - 1 x daily - 7 x weekly - 1-2 sets - 10 reps - 1-2 sec  hold - Supine Elbow Flexion Extension AROM  - 2 x daily - 7 x weekly - 2-3 sets - 10 reps - 3 sec  hold  ASSESSMENT:  CLINICAL IMPRESSION: Patient reports continued progress with Rt shoulder. She has ome days without pain and other days when she notices pain in the shoulder. She is consistent with HEP and trying to be careful with functional activities with Rt UE. Progressed with AAROM exercises and closed chain activities  for HEP. PROM/stretching with pt supine. Continued treatment including exercise; manual work; PROM; stretching; vaso. Decreased pain with treatment. Continue to focus on increasing joint mobility within parameters of protocol. Progressing well toward stated goals of therapy.    RTD 06/07/23  OBJECTIVE IMPAIRMENTS: decreased activity tolerance, decreased mobility, decreased ROM, decreased strength, hypomobility, increased fascial restrictions, increased muscle spasms, impaired flexibility, impaired UE functional use, improper body mechanics, postural dysfunction, and pain.     GOALS: Goals reviewed with patient? Yes  SHORT TERM GOALS: Target date: 05/09/2023   Independent in initial HEP  Baseline: Goal status: INITIAL  2.  Patient independent in donning and doffing sling  Baseline:  Goal status: INITIAL   LONG TERM GOALS: Target date: 06/20/2023   Rt shoulder ROM WFL's  Baseline:  Goal status: INITIAL  2.  Rt shoulder strength 4+/5 to 5/5 throughout  Baseline:  Goal status: INITIAL  3.  Improve posture and alignment with improved upright posture and posterior shoulder girdle engaged  Baseline:  Goal status: INITIAL  4.  Patient reports decreased pain in Rt shoulder with functional activities to no more than 2/10  Baseline:  Goal status: INITIAL  5.  Independent in HEP including aquatic program as indicated   Baseline:  Goal status: INITIAL  PLAN:  PT FREQUENCY: 2x/week  PT DURATION: 8 weeks  PLANNED INTERVENTIONS: Therapeutic exercises, Therapeutic activity, Neuromuscular re-education, Patient/Family education, Self Care, Joint mobilization, Aquatic Therapy, Dry Needling, Electrical stimulation, Spinal mobilization, Cryotherapy, Moist heat, Taping, Vasopneumatic device, Ultrasound, Ionotophoresis 4mg /ml Dexamethasone, Manual therapy, and Re-evaluation  PLAN FOR NEXT SESSION: review and progress exercise; continue with postural correction and education; manual work,DN, modalities as indicated    [employer], PT 05/09/2023, 11:00 AM

## 2023-05-11 ENCOUNTER — Encounter: Payer: Self-pay | Admitting: Rehabilitative and Restorative Service Providers"

## 2023-05-11 ENCOUNTER — Ambulatory Visit: Payer: Medicare HMO | Admitting: Rehabilitative and Restorative Service Providers"

## 2023-05-11 DIAGNOSIS — R293 Abnormal posture: Secondary | ICD-10-CM

## 2023-05-11 DIAGNOSIS — R29898 Other symptoms and signs involving the musculoskeletal system: Secondary | ICD-10-CM

## 2023-05-11 DIAGNOSIS — M25511 Pain in right shoulder: Secondary | ICD-10-CM

## 2023-05-11 DIAGNOSIS — M6281 Muscle weakness (generalized): Secondary | ICD-10-CM

## 2023-05-11 NOTE — Therapy (Signed)
OUTPATIENT PHYSICAL THERAPY SHOULDER TREATMENT    Patient Name: Mary Freeman MRN: [id number] DOB:December 14, 1950, 72 y.o., female Today's Date: 05/11/2023  END OF SESSION:  PT End of Session - 05/11/23 1021     Visit Number 12    Number of Visits 24    Date for PT Re-Evaluation 06/20/23    Authorization Type [employer] $20 copay    Progress Note Due on Visit 20    PT Start Time 1018    PT Stop Time 1106    PT Time Calculation (min) 48 min    Activity Tolerance Patient tolerated treatment well              Past Medical History:  Diagnosis Date   Depression    Headache    migraines   PONV (postoperative nausea and vomiting)    Past Surgical History:  Procedure Laterality Date   APPENDECTOMY     BREAST EXCISIONAL BIOPSY Right    benign   COLONOSCOPY     KNEE ARTHROSCOPY Right    x3   KNEE ARTHROSCOPY W/ MENISCAL REPAIR Left    LUMBAR LAMINECTOMY  1986   REDUCTION MAMMAPLASTY Bilateral    2006   ROTATOR CUFF REPAIR Right 2012   TONSILLECTOMY     TUBAL LIGATION     Patient Active Problem List   Diagnosis Date Noted   Anxiety 02/11/2021   Adjustment disorder with depressed mood 02/11/2021   Chronic kidney disease, stage 3 unspecified (HCC) 02/11/2021   Collagenous colitis 02/11/2021   Gastroesophageal reflux disease 02/11/2021   Migraine without aura, not refractory 02/11/2021   Mild depression 02/11/2021   Osteopenia 02/11/2021   Early satiety 01/12/2021   Bloating 01/12/2021   Abdominal pain 01/12/2021    PCP: Dr Sigmund Hazel  REFERRING PROVIDER: Dr Francena Hanly   REFERRING DIAG: Rt shoulder torn RC  THERAPY DIAG:  Acute pain of right shoulder  Other symptoms and signs involving the musculoskeletal system  Abnormal posture  Muscle weakness (generalized)  Rationale for Evaluation and Treatment: Rehabilitation  ONSET DATE: 02/25/23; surgery 03/21/23  SUBJECTIVE:                                                                                                                                                                                       SUBJECTIVE STATEMENT: Patient reports that she has had increased pain in the R shoulder since last PT visit. She has had pain in the elbow and forearm. The shoulder feels burning and is undescribable. The R arm feels "tight". She is working on her exercises and using ice at home. She is careful with how she uses her  arm. It's just hurting more today.    Hand dominance: Right  PERTINENT HISTORY: Patient reports falling onto Rt shoulder 02/25/23 sustaining torn RC and biceps. She underwent surgery 03/21/23 for [facility] repair and biceps tendon repair.   Lt shoulder scope RC, Lt 05/25/17 DCR/SAD/frayed RC/torn labrum; Rt shoulder pain; cervical pain 2018; LBP 2020; Rt knee pain 2022 - denies any medical problems   PAIN:  Are you having pain? Yes: NPRS scale: 4/10 Pain location: Rt shoulder; elbow; hand  Pain description: tightness; dull; aching; sore - occasionally sharp Aggravating factors: moving  Relieving factors: meds; avoiding movement  PRECAUTIONS: Shoulder per protocol  WEIGHT BEARING RESTRICTIONS: No  FALLS:  Has patient fallen in last 6 months? No  LIVING ENVIRONMENT: Lives with: lives alone Lives in: House/apartment Stairs: Yes: Internal: 12 steps; on right going up and External: 3 steps; on right going up Has following equipment at home: Single point cane  OCCUPATION: Retired oncology RN 2021; active with household chores, gardening, yard work    PLOF: Independent  PATIENT GOALS:get the arm working like normal again   NEXT MD VISIT: 05/03/23  OBJECTIVE:   UPPER EXTREMITY ROM:   Active ROM Right eval Left eval  Shoulder flexion    Shoulder extension    Shoulder abduction    Shoulder adduction    Shoulder internal rotation    Shoulder external rotation    Elbow flexion    Elbow extension    Wrist flexion    Wrist extension    Wrist ulnar deviation    Wrist radial  deviation    Wrist pronation    Wrist supination    (Blank rows = not tested)  UPPER EXTREMITY MMT:  MMT Right eval Left eval  Shoulder flexion    Shoulder extension    Shoulder abduction    Shoulder adduction    Shoulder internal rotation    Shoulder external rotation    Middle trapezius    Lower trapezius    Elbow flexion    Elbow extension    Wrist flexion    Wrist extension    Wrist ulnar deviation    Wrist radial deviation    Wrist pronation    Wrist supination    Grip strength (lbs)    (Blank rows = not tested)  PALPATION:  Muscular tightness Rt shoulder girdle    [facility] Adult PT Treatment:                                                DATE: 05/11/23 Therapeutic Exercise: (Reviewed exercises for home program) Pendulum A/P, laterally, CW/CCW all x 20 Pulley flexion 10 sec x 10  Pulley scaption 10 sec x 10  Pulley horizontal ab/ad at ~ 90 deg elevation Shoulder alphabet ball supported on table  ER with cane standing 10 sec x 10  Scap squeeze supine 10 sec x 10 Backwards shoulder rolls 3-5 sec x 10  Elbow flex/ext x 10 Pronation/supination x 10  Rolling green swiss ball into forward flexion x 10  Prone shoudler extension 3 sec x 10  Prone row 3 sec x 10  Biceps curl 1# x 10  Triceps extension supine 1# x 10  Manual Therapy: (patient supine) PROM in supine within tissue tolerance and protocol guidelines  SMT Rt upper upper quarter Modalities:  Vaso 34 deg; medium pressure x 10 min  Lt shoulder; moist heat to cervical spine  [facility] Adult PT Treatment:                                                DATE: 05/09/23 Therapeutic Exercise: Pendulum A/P, laterally, CW/CCW all x 20 Pulley flexion 10 sec x 10  Pulley scaption 10 sec x 10  Pulley horizontal ab/ad at ~ 90 deg elevation Shoulder alphabet ball supported on table  ER with cane standing 10 sec x 10  Scap squeeze supine 10 sec x 10 Backwards shoulder rolls 3-5 sec x 10  Elbow flex/ext x  10 Pronation/supination x 10  Rolling green swiss ball into forward flexion x 10  Prone shoudler extension 3 sec x 10  Prone row 3 sec x 10  Biceps curl 1# x 10  Triceps extension supine 1# x 10  Manual Therapy: (patient supine) IASTM through the Rt shoulder area PROM in supine within tissue tolerance and protocol guidelines  SMT Rt upper upper quarter Modalities:  Vaso 34 deg; medium pressure x 10 min Lt shoulder; moist heat to cervical spine    PATIENT EDUCATION: Education details: POC; HEP  Person educated: Patient Education method: Programmer, multimedia, Demonstration, Actor cues, Verbal cues, and Handouts Education comprehension: verbalized understanding, returned demonstration, verbal cues required, tactile cues required, and needs further education  HOME EXERCISE PROGRAM:  Access Code: [email or web address] URL: [email or web address] Date: 05/09/2023 Prepared by: Corlis Leak  Exercises - Flexion-Extension Shoulder Pendulum with Table Support  - 3-4 x daily - 7 x weekly - 1 sets - 20-30 reps - Horizontal Shoulder Pendulum with Table Support  - 3-4 x daily - 7 x weekly - 1 sets - 20-30 reps - Circular Shoulder Pendulum with Table Support  - 3-4 x daily - 7 x weekly - 1 sets - 20-30 reps - Standing Shoulder Flexion AAROM with Swiss Ball  - 2 x daily - 7 x weekly - 1 sets - 10 reps - 5-10 sec  hold - Seated Scapular Retraction  - 2 x daily - 7 x weekly - 1-2 sets - 10 reps - 10 sec  hold - Standing Scapular Retraction  - 3 x daily - 7 x weekly - 1 sets - 10 reps - 10 sec  hold - Seated Shoulder Flexion AAROM with Pulley Behind  - 2 x daily - 7 x weekly - 1 sets - 10 reps - 10 sec  hold - Seated Shoulder Scaption AAROM with Pulley at Side  - 2 x daily - 7 x weekly - 1 sets - 10 reps - 10sec  hold - Seated Shoulder External Rotation AAROM with Cane and Hand in Neutral  - 2 x daily - 7 x weekly - 1 sets - 5-10 reps - 5-10 sec  hold - Shoulder Alphabet with Ball at Counter  - 2 x daily -  7 x weekly - 1 sets - Prone Shoulder Extension - Single Arm  - 1 x daily - 7 x weekly - 1-2 sets - 10 reps - 3  sec  hold - Prone Shoulder Row  - 1 x daily - 7 x weekly - 1-2 sets - 10 reps - 3 sec  hold - Seated Single Arm Bicep Curls Supinated with Dumbbell  - 1 x daily - 7 x weekly - 1-2 sets - 10 reps - 1-2 sec  hold -  Supine Elbow Flexion Extension AROM  - 2 x daily - 7 x weekly - 2-3 sets - 10 reps - 3 sec  hold  ASSESSMENT:  CLINICAL IMPRESSION: Patient reports increased pain in the R shoulder and elbow in the past two days.  Discussed decreasing reps and frequency with exercises for a day or so to allow shoulder and elbow pain to subside. She will continue to be consistent with HEP and trying to be careful with functional activities with Rt UE. Reviewed exercises for home. Continued with PROM/stretching with pt supine.Treatment included exercise; manual work; PROM; stretching; vaso. Decreased pain with treatment. Continue to focus on increasing joint mobility within parameters of protocol. Progressing well toward stated goals of therapy.    RTD 06/07/23  OBJECTIVE IMPAIRMENTS: decreased activity tolerance, decreased mobility, decreased ROM, decreased strength, hypomobility, increased fascial restrictions, increased muscle spasms, impaired flexibility, impaired UE functional use, improper body mechanics, postural dysfunction, and pain.     GOALS: Goals reviewed with patient? Yes  SHORT TERM GOALS: Target date: 05/09/2023   Independent in initial HEP  Baseline: Goal status: INITIAL  2.  Patient independent in donning and doffing sling  Baseline:  Goal status: INITIAL   LONG TERM GOALS: Target date: 06/20/2023   Rt shoulder ROM WFL's  Baseline:  Goal status: INITIAL  2.  Rt shoulder strength 4+/5 to 5/5 throughout  Baseline:  Goal status: INITIAL  3.  Improve posture and alignment with improved upright posture and posterior shoulder girdle engaged  Baseline:  Goal  status: INITIAL  4.  Patient reports decreased pain in Rt shoulder with functional activities to no more than 2/10  Baseline:  Goal status: INITIAL  5.  Independent in HEP including aquatic program as indicated  Baseline:  Goal status: INITIAL  PLAN:  PT FREQUENCY: 2x/week  PT DURATION: 8 weeks  PLANNED INTERVENTIONS: Therapeutic exercises, Therapeutic activity, Neuromuscular re-education, Patient/Family education, Self Care, Joint mobilization, Aquatic Therapy, Dry Needling, Electrical stimulation, Spinal mobilization, Cryotherapy, Moist heat, Taping, Vasopneumatic device, Ultrasound, Ionotophoresis 4mg /ml Dexamethasone, Manual therapy, and Re-evaluation  PLAN FOR NEXT SESSION: review and progress exercise; continue with postural correction and education; manual work,DN, modalities as indicated    [employer], PT 05/11/2023, 10:22 AM

## 2023-05-16 ENCOUNTER — Ambulatory Visit: Payer: Medicare HMO | Admitting: Rehabilitative and Restorative Service Providers"

## 2023-05-16 ENCOUNTER — Encounter: Payer: Self-pay | Admitting: Rehabilitative and Restorative Service Providers"

## 2023-05-16 DIAGNOSIS — R293 Abnormal posture: Secondary | ICD-10-CM

## 2023-05-16 DIAGNOSIS — M25511 Pain in right shoulder: Secondary | ICD-10-CM | POA: Diagnosis not present

## 2023-05-16 DIAGNOSIS — M6281 Muscle weakness (generalized): Secondary | ICD-10-CM | POA: Diagnosis not present

## 2023-05-16 DIAGNOSIS — R29898 Other symptoms and signs involving the musculoskeletal system: Secondary | ICD-10-CM

## 2023-05-16 NOTE — Therapy (Signed)
OUTPATIENT PHYSICAL THERAPY SHOULDER TREATMENT    Patient Name: Mary Freeman MRN: [id number] DOB:Dec 13, 1950, 72 y.o., female Today's Date: 05/16/2023  END OF SESSION:  PT End of Session - 05/16/23 1103     Visit Number 13    Number of Visits 24    Date for PT Re-Evaluation 06/20/23    Authorization Type [employer] $20 copay    Authorization - Visit Number 13    Progress Note Due on Visit 20    PT Start Time 1100    PT Stop Time 1148    PT Time Calculation (min) 48 min              Past Medical History:  Diagnosis Date   Depression    Headache    migraines   PONV (postoperative nausea and vomiting)    Past Surgical History:  Procedure Laterality Date   APPENDECTOMY     BREAST EXCISIONAL BIOPSY Right    benign   COLONOSCOPY     KNEE ARTHROSCOPY Right    x3   KNEE ARTHROSCOPY W/ MENISCAL REPAIR Left    LUMBAR LAMINECTOMY  1986   REDUCTION MAMMAPLASTY Bilateral    2006   ROTATOR CUFF REPAIR Right 2012   TONSILLECTOMY     TUBAL LIGATION     Patient Active Problem List   Diagnosis Date Noted   Anxiety 02/11/2021   Adjustment disorder with depressed mood 02/11/2021   Chronic kidney disease, stage 3 unspecified (HCC) 02/11/2021   Collagenous colitis 02/11/2021   Gastroesophageal reflux disease 02/11/2021   Migraine without aura, not refractory 02/11/2021   Mild depression 02/11/2021   Osteopenia 02/11/2021   Early satiety 01/12/2021   Bloating 01/12/2021   Abdominal pain 01/12/2021    PCP: Dr Sigmund Hazel  REFERRING PROVIDER: Dr Francena Hanly   REFERRING DIAG: Rt shoulder torn RC  THERAPY DIAG:  Acute pain of right shoulder  Other symptoms and signs involving the musculoskeletal system  Abnormal posture  Muscle weakness (generalized)  Rationale for Evaluation and Treatment: Rehabilitation  ONSET DATE: 02/25/23; surgery 03/21/23  SUBJECTIVE:                                                                                                                                                                                       SUBJECTIVE STATEMENT: Patient reports that she has had increased pain in the R shoulder since Friday and even more pain in the past two days. She has had pain in the elbow and forearm. The shoulder feels burning off and on. The R arm feels "tight". She is working on her exercises and using ice at home. She is  careful with how she uses her arm. It's just hurting more today.    Hand dominance: Right  PERTINENT HISTORY: Patient reports falling onto Rt shoulder 02/25/23 sustaining torn RC and biceps. She underwent surgery 03/21/23 for [facility] repair and biceps tendon repair.   Lt shoulder scope RC, Lt 05/25/17 DCR/SAD/frayed RC/torn labrum; Rt shoulder pain; cervical pain 2018; LBP 2020; Rt knee pain 2022 - denies any medical problems   PAIN:  Are you having pain? Yes: NPRS scale: 7/10 Pain location: Rt shoulder; elbow; hand  Pain description: tightness; dull; aching; sore - occasionally sharp Aggravating factors: moving  Relieving factors: meds; avoiding movement  PRECAUTIONS: Shoulder per protocol  WEIGHT BEARING RESTRICTIONS: No  FALLS:  Has patient fallen in last 6 months? No  LIVING ENVIRONMENT: Lives with: lives alone Lives in: House/apartment Stairs: Yes: Internal: 12 steps; on right going up and External: 3 steps; on right going up Has following equipment at home: Single point cane  OCCUPATION: Retired oncology RN 2021; active with household chores, gardening, yard work    PLOF: Independent  PATIENT GOALS:get the arm working like normal again   NEXT MD VISIT: 05/03/23  OBJECTIVE:   UPPER EXTREMITY ROM:   Active ROM Right eval Left eval  Shoulder flexion    Shoulder extension    Shoulder abduction    Shoulder adduction    Shoulder internal rotation    Shoulder external rotation    Elbow flexion    Elbow extension    Wrist flexion    Wrist extension    Wrist ulnar deviation    Wrist  radial deviation    Wrist pronation    Wrist supination    (Blank rows = not tested)  UPPER EXTREMITY MMT:  MMT Right eval Left eval  Shoulder flexion    Shoulder extension    Shoulder abduction    Shoulder adduction    Shoulder internal rotation    Shoulder external rotation    Middle trapezius    Lower trapezius    Elbow flexion    Elbow extension    Wrist flexion    Wrist extension    Wrist ulnar deviation    Wrist radial deviation    Wrist pronation    Wrist supination    Grip strength (lbs)    (Blank rows = not tested)  PALPATION:  Muscular tightness Rt shoulder girdle  05/16/23: muscular tightness Rt lateral cervical musculature    [facility] Adult PT Treatment:                                                DATE: 05/16/23 Therapeutic Exercise: (Reviewed exercises for home program - will hold prone exercises and continue with other exercises) Pendulum A/P, laterally, CW/CCW all x 20 Pulley flexion 10 sec x 10  Pulley scaption 10 sec x 10  Pulley horizontal ab/ad at ~ 90 deg elevation  Manual Therapy: (patient supine) Skilled palpation to assess response to manual work and DN PROM in supine within tissue tolerance and protocol guidelines  SMT Rt upper upper quarter Trigger Point Dry-Needling  Treatment instructions: Expect mild to moderate muscle soreness. S/S of pneumothorax if dry needled over a lung field, and to seek immediate medical attention should they occur. Patient verbalized understanding of these instructions and education.  Patient Consent Given: Yes Education handout provided: Previously provided Muscles treated: R  lateral cervical and anterior fibers of upper trap  Electrical stimulation performed: No Parameters: N/A Treatment response/outcome: decreased palpable tightness and full resolution of radicular symptoms   Modalities:  Vaso 34 deg; medium pressure x 10 min Lt shoulder; moist heat to cervical spine  [facility] Adult PT Treatment:                                                 DATE: 05/11/23 Therapeutic Exercise: (Reviewed exercises for home program) Pendulum A/P, laterally, CW/CCW all x 20 Pulley flexion 10 sec x 10  Pulley scaption 10 sec x 10  Pulley horizontal ab/ad at ~ 90 deg elevation Shoulder alphabet ball supported on table  ER with cane standing 10 sec x 10  Scap squeeze supine 10 sec x 10 Backwards shoulder rolls 3-5 sec x 10  Elbow flex/ext x 10 Pronation/supination x 10  Rolling green swiss ball into forward flexion x 10  Prone shoudler extension 3 sec x 10  Prone row 3 sec x 10  Biceps curl 1# x 10  Triceps extension supine 1# x 10  Manual Therapy: (patient supine) PROM in supine within tissue tolerance and protocol guidelines  SMT Rt upper upper quarter Modalities:  Vaso 34 deg; medium pressure x 10 min Lt shoulder; moist heat to cervical spine  [facility] Adult PT Treatment:                                                DATE: 05/09/23 Therapeutic Exercise: Pendulum A/P, laterally, CW/CCW all x 20 Pulley flexion 10 sec x 10  Pulley scaption 10 sec x 10  Pulley horizontal ab/ad at ~ 90 deg elevation Shoulder alphabet ball supported on table  ER with cane standing 10 sec x 10  Scap squeeze supine 10 sec x 10 Backwards shoulder rolls 3-5 sec x 10  Elbow flex/ext x 10 Pronation/supination x 10  Rolling green swiss ball into forward flexion x 10  Prone shoudler extension 3 sec x 10  Prone row 3 sec x 10  Biceps curl 1# x 10  Triceps extension supine 1# x 10  Manual Therapy: (patient supine) IASTM through the Rt shoulder area PROM in supine within tissue tolerance and protocol guidelines  SMT Rt upper upper quarter Modalities:  Vaso 34 deg; medium pressure x 10 min Lt shoulder; moist heat to cervical spine    PATIENT EDUCATION: Education details: POC; HEP  Person educated: Patient Education method: Programmer, multimedia, Demonstration, Tactile cues, Verbal cues, and Handouts Education comprehension: verbalized  understanding, returned demonstration, verbal cues required, tactile cues required, and needs further education  HOME EXERCISE PROGRAM:  Access Code: [email or web address] URL: [email or web address] Date: 05/09/2023 Prepared by: Corlis Leak  Exercises - Flexion-Extension Shoulder Pendulum with Table Support  - 3-4 x daily - 7 x weekly - 1 sets - 20-30 reps - Horizontal Shoulder Pendulum with Table Support  - 3-4 x daily - 7 x weekly - 1 sets - 20-30 reps - Circular Shoulder Pendulum with Table Support  - 3-4 x daily - 7 x weekly - 1 sets - 20-30 reps - Standing Shoulder Flexion AAROM with Swiss Ball  - 2 x daily - 7 x weekly - 1 sets - 10 reps -  5-10 sec  hold - Seated Scapular Retraction  - 2 x daily - 7 x weekly - 1-2 sets - 10 reps - 10 sec  hold - Standing Scapular Retraction  - 3 x daily - 7 x weekly - 1 sets - 10 reps - 10 sec  hold - Seated Shoulder Flexion AAROM with Pulley Behind  - 2 x daily - 7 x weekly - 1 sets - 10 reps - 10 sec  hold - Seated Shoulder Scaption AAROM with Pulley at Side  - 2 x daily - 7 x weekly - 1 sets - 10 reps - 10sec  hold - Seated Shoulder External Rotation AAROM with Cane and Hand in Neutral  - 2 x daily - 7 x weekly - 1 sets - 5-10 reps - 5-10 sec  hold - Shoulder Alphabet with Ball at Counter  - 2 x daily - 7 x weekly - 1 sets - Prone Shoulder Extension - Single Arm  - 1 x daily - 7 x weekly - 1-2 sets - 10 reps - 3  sec  hold - Prone Shoulder Row  - 1 x daily - 7 x weekly - 1-2 sets - 10 reps - 3 sec  hold - Seated Single Arm Bicep Curls Supinated with Dumbbell  - 1 x daily - 7 x weekly - 1-2 sets - 10 reps - 1-2 sec  hold - Supine Elbow Flexion Extension AROM  - 2 x daily - 7 x weekly - 2-3 sets - 10 reps - 3 sec  hold  ASSESSMENT:  CLINICAL IMPRESSION: Patient reports increased pain in the R shoulder and elbow in the past two days with some intermittent numbness into the R thumb and index finger.  Discussed decreasing reps and frequency with  exercises for a day or so to allow shoulder and elbow pain to subside. She will hold on prone exercises in case that position is irritating to cervical spine. Symptoms in Rt UE were fully resolved with manual work and DN to R cervical spine. Patient will continue to be consistent with HEP as noted above and trying to be careful with functional activities with Rt UE. Reviewed exercises for home. Continued with PROM/stretching with pt supine.Treatment included exercise; manual work; PROM; stretching; vaso. Decreased pain with treatment. Continue to focus on increasing joint mobility within parameters of protocol. Progressing well toward stated goals of therapy.    RTD 06/07/23  OBJECTIVE IMPAIRMENTS: decreased activity tolerance, decreased mobility, decreased ROM, decreased strength, hypomobility, increased fascial restrictions, increased muscle spasms, impaired flexibility, impaired UE functional use, improper body mechanics, postural dysfunction, and pain.     GOALS: Goals reviewed with patient? Yes  SHORT TERM GOALS: Target date: 05/09/2023   Independent in initial HEP  Baseline: Goal status: INITIAL  2.  Patient independent in donning and doffing sling  Baseline:  Goal status: INITIAL   LONG TERM GOALS: Target date: 06/20/2023   Rt shoulder ROM WFL's  Baseline:  Goal status: INITIAL  2.  Rt shoulder strength 4+/5 to 5/5 throughout  Baseline:  Goal status: INITIAL  3.  Improve posture and alignment with improved upright posture and posterior shoulder girdle engaged  Baseline:  Goal status: INITIAL  4.  Patient reports decreased pain in Rt shoulder with functional activities to no more than 2/10  Baseline:  Goal status: INITIAL  5.  Independent in HEP including aquatic program as indicated  Baseline:  Goal status: INITIAL  PLAN:  PT FREQUENCY: 2x/week  PT DURATION:  8 weeks  PLANNED INTERVENTIONS: Therapeutic exercises, Therapeutic activity, Neuromuscular  re-education, Patient/Family education, Self Care, Joint mobilization, Aquatic Therapy, Dry Needling, Electrical stimulation, Spinal mobilization, Cryotherapy, Moist heat, Taping, Vasopneumatic device, Ultrasound, Ionotophoresis 4mg /ml Dexamethasone, Manual therapy, and Re-evaluation  PLAN FOR NEXT SESSION: review and progress exercise; continue with postural correction and education; manual work,DN, modalities as indicated     Rober Minion, PT 05/16/2023, 11:04 AM

## 2023-05-18 ENCOUNTER — Encounter: Payer: Self-pay | Admitting: Rehabilitative and Restorative Service Providers"

## 2023-05-18 ENCOUNTER — Ambulatory Visit: Payer: Medicare HMO | Admitting: Rehabilitative and Restorative Service Providers"

## 2023-05-18 DIAGNOSIS — M6281 Muscle weakness (generalized): Secondary | ICD-10-CM | POA: Diagnosis not present

## 2023-05-18 DIAGNOSIS — R293 Abnormal posture: Secondary | ICD-10-CM

## 2023-05-18 DIAGNOSIS — M25511 Pain in right shoulder: Secondary | ICD-10-CM | POA: Diagnosis not present

## 2023-05-18 DIAGNOSIS — C44629 Squamous cell carcinoma of skin of left upper limb, including shoulder: Secondary | ICD-10-CM | POA: Diagnosis not present

## 2023-05-18 DIAGNOSIS — D485 Neoplasm of uncertain behavior of skin: Secondary | ICD-10-CM | POA: Diagnosis not present

## 2023-05-18 DIAGNOSIS — R29898 Other symptoms and signs involving the musculoskeletal system: Secondary | ICD-10-CM

## 2023-05-18 NOTE — Therapy (Signed)
OUTPATIENT PHYSICAL THERAPY SHOULDER TREATMENT    Patient Name: Mary Freeman MRN: [id number] DOB:1951/09/18, 72 y.o., female Today's Date: 05/18/2023  END OF SESSION:  PT End of Session - 05/18/23 1107     Visit Number 14    Number of Visits 24    Date for PT Re-Evaluation 06/20/23    Authorization Type [employer] $20 copay    Authorization - Visit Number 14    Progress Note Due on Visit 20    PT Start Time 1100    PT Stop Time 1148    PT Time Calculation (min) 48 min    Activity Tolerance Patient tolerated treatment well              Past Medical History:  Diagnosis Date   Depression    Headache    migraines   PONV (postoperative nausea and vomiting)    Past Surgical History:  Procedure Laterality Date   APPENDECTOMY     BREAST EXCISIONAL BIOPSY Right    benign   COLONOSCOPY     KNEE ARTHROSCOPY Right    x3   KNEE ARTHROSCOPY W/ MENISCAL REPAIR Left    LUMBAR LAMINECTOMY  1986   REDUCTION MAMMAPLASTY Bilateral    2006   ROTATOR CUFF REPAIR Right 2012   TONSILLECTOMY     TUBAL LIGATION     Patient Active Problem List   Diagnosis Date Noted   Anxiety 02/11/2021   Adjustment disorder with depressed mood 02/11/2021   Chronic kidney disease, stage 3 unspecified (HCC) 02/11/2021   Collagenous colitis 02/11/2021   Gastroesophageal reflux disease 02/11/2021   Migraine without aura, not refractory 02/11/2021   Mild depression 02/11/2021   Osteopenia 02/11/2021   Early satiety 01/12/2021   Bloating 01/12/2021   Abdominal pain 01/12/2021    PCP: Dr Sigmund Hazel  REFERRING PROVIDER: Dr Francena Hanly   REFERRING DIAG: Rt shoulder torn RC  THERAPY DIAG:  Acute pain of right shoulder  Other symptoms and signs involving the musculoskeletal system  Abnormal posture  Muscle weakness (generalized)  Rationale for Evaluation and Treatment: Rehabilitation  ONSET DATE: 02/25/23; surgery 03/21/23  SUBJECTIVE:                                                                                                                                                                                       SUBJECTIVE STATEMENT: Patient reports that she has had much less pain in the R shoulder and arm since last treatment. She has some pain in the elbow area but much less.  She is working on her exercises and using ice at home. She is careful with how she uses  her arm. It's just hurting more today.    Hand dominance: Right  PERTINENT HISTORY: Patient reports falling onto Rt shoulder 02/25/23 sustaining torn RC and biceps. She underwent surgery 03/21/23 for [facility] repair and biceps tendon repair.   Lt shoulder scope RC, Lt 05/25/17 DCR/SAD/frayed RC/torn labrum; Rt shoulder pain; cervical pain 2018; LBP 2020; Rt knee pain 2022 - denies any medical problems   PAIN:  Are you having pain? Yes: NPRS scale: 2/10 Pain location: Rt shoulder; elbow; hand  Pain description: tightness; dull; aching; sore - occasionally sharp Aggravating factors: moving  Relieving factors: meds; avoiding movement  PRECAUTIONS: Shoulder per protocol  WEIGHT BEARING RESTRICTIONS: No  FALLS:  Has patient fallen in last 6 months? No  LIVING ENVIRONMENT: Lives with: lives alone Lives in: House/apartment Stairs: Yes: Internal: 12 steps; on right going up and External: 3 steps; on right going up Has following equipment at home: Single point cane  OCCUPATION: Retired oncology RN 2021; active with household chores, gardening, yard work    PLOF: Independent  PATIENT GOALS:get the arm working like normal again   NEXT MD VISIT: 05/03/23  OBJECTIVE:   UPPER EXTREMITY ROM:   Active ROM Right eval Left eval  Shoulder flexion    Shoulder extension    Shoulder abduction    Shoulder adduction    Shoulder internal rotation    Shoulder external rotation    Elbow flexion    Elbow extension    Wrist flexion    Wrist extension    Wrist ulnar deviation    Wrist radial deviation    Wrist  pronation    Wrist supination    (Blank rows = not tested)  UPPER EXTREMITY MMT:  MMT Right eval Left eval  Shoulder flexion    Shoulder extension    Shoulder abduction    Shoulder adduction    Shoulder internal rotation    Shoulder external rotation    Middle trapezius    Lower trapezius    Elbow flexion    Elbow extension    Wrist flexion    Wrist extension    Wrist ulnar deviation    Wrist radial deviation    Wrist pronation    Wrist supination    Grip strength (lbs)    (Blank rows = not tested)  PALPATION:  Muscular tightness Rt shoulder girdle  05/16/23: muscular tightness Rt lateral cervical musculature    [facility] Adult PT Treatment:                                                DATE: 05/18/23 Therapeutic Exercise: (Reviewed exercises for home program - will hold prone exercises and continue with other exercises) Pendulum A/P, laterally, CW/CCW all x 20 Pulley flexion 10 sec x 10  Pulley scaption 10 sec x 10  Pulley horizontal ab/ad at ~ 90 deg elevation Isometric row red TB 3 sec x 10 Isometric ER red TB R x 3 sec x 10  Isometric IR red TB R x 3 sec x 10  Manual Therapy: (patient supine) Skilled palpation to assess response to manual work and DN PROM in supine within tissue tolerance and protocol guidelines  SMT Rt upper upper quarter Trigger Point Dry-Needling  Treatment instructions: Expect mild to moderate muscle soreness. S/S of pneumothorax if dry needled over a lung field, and to seek immediate medical attention  should they occur. Patient verbalized understanding of these instructions and education.  Patient Consent Given: Yes Education handout provided: Previously provided Muscles treated: R lateral cervical and anterior fibers of upper trap; R extensor forearm  Electrical stimulation performed: No Parameters: N/A Treatment response/outcome: decreased palpable tightness and full resolution of radicular symptoms   Modalities:  Vaso 34 deg; medium  pressure x 10 min Lt shoulder; moist heat to cervical spine  [facility] Adult PT Treatment:                                                DATE: 05/16/23 Therapeutic Exercise: (Reviewed exercises for home program - will hold prone exercises and continue with other exercises) Pendulum A/P, laterally, CW/CCW all x 20 Pulley flexion 10 sec x 10  Pulley scaption 10 sec x 10  Pulley horizontal ab/ad at ~ 90 deg elevation  Manual Therapy: (patient supine) Skilled palpation to assess response to manual work and DN PROM in supine within tissue tolerance and protocol guidelines  SMT Rt upper upper quarter Trigger Point Dry-Needling  Treatment instructions: Expect mild to moderate muscle soreness. S/S of pneumothorax if dry needled over a lung field, and to seek immediate medical attention should they occur. Patient verbalized understanding of these instructions and education.  Patient Consent Given: Yes Education handout provided: Previously provided Muscles treated: R lateral cervical and anterior fibers of upper trap  Electrical stimulation performed: No Parameters: N/A Treatment response/outcome: decreased palpable tightness and full resolution of radicular symptoms   Modalities:  Vaso 34 deg; medium pressure x 10 min Lt shoulder; moist heat to cervical spine  [facility] Adult PT Treatment:                                                DATE: 05/11/23 Therapeutic Exercise: (Reviewed exercises for home program) Pendulum A/P, laterally, CW/CCW all x 20 Pulley flexion 10 sec x 10  Pulley scaption 10 sec x 10  Pulley horizontal ab/ad at ~ 90 deg elevation Shoulder alphabet ball supported on table  ER with cane standing 10 sec x 10  Scap squeeze supine 10 sec x 10 Backwards shoulder rolls 3-5 sec x 10  Elbow flex/ext x 10 Pronation/supination x 10  Rolling green swiss ball into forward flexion x 10  Prone shoudler extension 3 sec x 10  Prone row 3 sec x 10  Biceps curl 1# x 10  Triceps extension  supine 1# x 10  Manual Therapy: (patient supine) PROM in supine within tissue tolerance and protocol guidelines  SMT Rt upper upper quarter Modalities:  Vaso 34 deg; medium pressure x 10 min Lt shoulder; moist heat to cervical spine   PATIENT EDUCATION: Education details: POC; HEP  Person educated: Patient Education method: Programmer, multimedia, Demonstration, Actor cues, Verbal cues, and Handouts Education comprehension: verbalized understanding, returned demonstration, verbal cues required, tactile cues required, and needs further education  HOME EXERCISE PROGRAM:  Access Code: [email or web address] URL: [email or web address] Date: 05/18/2023 Prepared by: Corlis Leak  Exercises - Flexion-Extension Shoulder Pendulum with Table Support  - 3-4 x daily - 7 x weekly - 1 sets - 20-30 reps - Horizontal Shoulder Pendulum with Table Support  - 3-4 x daily - 7 x weekly - 1  sets - 20-30 reps - Circular Shoulder Pendulum with Table Support  - 3-4 x daily - 7 x weekly - 1 sets - 20-30 reps - Standing Shoulder Flexion AAROM with Swiss Ball  - 2 x daily - 7 x weekly - 1 sets - 10 reps - 5-10 sec  hold - Seated Scapular Retraction  - 2 x daily - 7 x weekly - 1-2 sets - 10 reps - 10 sec  hold - Standing Scapular Retraction  - 3 x daily - 7 x weekly - 1 sets - 10 reps - 10 sec  hold - Seated Shoulder Flexion AAROM with Pulley Behind  - 2 x daily - 7 x weekly - 1 sets - 10 reps - 10 sec  hold - Seated Shoulder Scaption AAROM with Pulley at Side  - 2 x daily - 7 x weekly - 1 sets - 10 reps - 10sec  hold - Seated Shoulder External Rotation AAROM with Cane and Hand in Neutral  - 2 x daily - 7 x weekly - 1 sets - 5-10 reps - 5-10 sec  hold - Shoulder Alphabet with Ball at Counter  - 2 x daily - 7 x weekly - 1 sets - Prone Shoulder Extension - Single Arm  - 1 x daily - 7 x weekly - 1-2 sets - 10 reps - 3  sec  hold - Prone Shoulder Row  - 1 x daily - 7 x weekly - 1-2 sets - 10 reps - 3 sec  hold - Seated Single  Arm Bicep Curls Supinated with Dumbbell  - 1 x daily - 7 x weekly - 1-2 sets - 10 reps - 1-2 sec  hold - Supine Elbow Flexion Extension AROM  - 2 x daily - 7 x weekly - 2-3 sets - 10 reps - 3 sec  hold - Standing Shoulder Row Reactive Isometric  - 2 x daily - 7 x weekly - 1 sets - 10 reps - 30-45 sec  hold - Shoulder External Rotation Reactive Isometrics  - 1 x daily - 7 x weekly - 1 sets - 10 reps - 3-5 sec  hold - Shoulder Internal Rotation Reactive Isometrics  - 2 x daily - 7 x weekly - 1 sets - 10 reps - 3-5 sec  hold  ASSESSMENT:  CLINICAL IMPRESSION: Excellent response to treatment addressing cervical tightness and radicular symptoms. Patient has continued tightness and some pain in the R shoulder and elbow but no numbness into the R thumb and index finger.  She will resume exercises from prior treatment sessions. Added isometric row, ER, IR. She will hold on prone exercises in case that position is irritating to cervical spine. Symptoms in Rt UE were resolved with manual work and DN to R cervical spine. Patient will continue to be consistent with HEP as noted above and trying to be careful with functional activities with Rt UE. Reviewed exercises for home. Continued with PROM/stretching with pt supine.Treatment included exercise; manual work; PROM; stretching; vaso. Decreased pain reported with treatment. Continue to focus on increasing joint mobility within parameters of protocol. Progressing well toward stated goals of therapy.    RTD 06/07/23  OBJECTIVE IMPAIRMENTS: decreased activity tolerance, decreased mobility, decreased ROM, decreased strength, hypomobility, increased fascial restrictions, increased muscle spasms, impaired flexibility, impaired UE functional use, improper body mechanics, postural dysfunction, and pain.     GOALS: Goals reviewed with patient? Yes  SHORT TERM GOALS: Target date: 05/09/2023   Independent in initial HEP  Baseline: Goal status: INITIAL  2.   Patient independent in donning and doffing sling  Baseline:  Goal status: INITIAL   LONG TERM GOALS: Target date: 06/20/2023   Rt shoulder ROM WFL's  Baseline:  Goal status: INITIAL  2.  Rt shoulder strength 4+/5 to 5/5 throughout  Baseline:  Goal status: INITIAL  3.  Improve posture and alignment with improved upright posture and posterior shoulder girdle engaged  Baseline:  Goal status: INITIAL  4.  Patient reports decreased pain in Rt shoulder with functional activities to no more than 2/10  Baseline:  Goal status: INITIAL  5.  Independent in HEP including aquatic program as indicated  Baseline:  Goal status: INITIAL  PLAN:  PT FREQUENCY: 2x/week  PT DURATION: 8 weeks  PLANNED INTERVENTIONS: Therapeutic exercises, Therapeutic activity, Neuromuscular re-education, Patient/Family education, Self Care, Joint mobilization, Aquatic Therapy, Dry Needling, Electrical stimulation, Spinal mobilization, Cryotherapy, Moist heat, Taping, Vasopneumatic device, Ultrasound, Ionotophoresis 4mg /ml Dexamethasone, Manual therapy, and Re-evaluation  PLAN FOR NEXT SESSION: review and progress exercise; continue with postural correction and education; manual work,DN, modalities as indicated    [employer], PT 05/18/2023, 11:08 AM

## 2023-05-22 DIAGNOSIS — G4721 Circadian rhythm sleep disorder, delayed sleep phase type: Secondary | ICD-10-CM | POA: Diagnosis not present

## 2023-05-22 DIAGNOSIS — G4719 Other hypersomnia: Secondary | ICD-10-CM | POA: Diagnosis not present

## 2023-05-23 ENCOUNTER — Ambulatory Visit: Payer: Medicare HMO | Admitting: Rehabilitative and Restorative Service Providers"

## 2023-05-23 ENCOUNTER — Encounter: Payer: Self-pay | Admitting: Rehabilitative and Restorative Service Providers"

## 2023-05-23 DIAGNOSIS — R29898 Other symptoms and signs involving the musculoskeletal system: Secondary | ICD-10-CM | POA: Diagnosis not present

## 2023-05-23 DIAGNOSIS — M25511 Pain in right shoulder: Secondary | ICD-10-CM

## 2023-05-23 DIAGNOSIS — M6281 Muscle weakness (generalized): Secondary | ICD-10-CM

## 2023-05-23 DIAGNOSIS — R293 Abnormal posture: Secondary | ICD-10-CM | POA: Diagnosis not present

## 2023-05-23 NOTE — Therapy (Signed)
OUTPATIENT PHYSICAL THERAPY SHOULDER TREATMENT    Patient Name: Mary Freeman MRN: [id number] DOB:09-11-1951, 72 y.o., female Today's Date: 05/23/2023  END OF SESSION:  PT End of Session - 05/23/23 1059     Visit Number 15    Number of Visits 24    Date for PT Re-Evaluation 06/20/23    Authorization Type [employer] $20 copay    Authorization - Visit Number 15    Progress Note Due on Visit 20    PT Start Time 1058    PT Stop Time 1147    PT Time Calculation (min) 49 min    Activity Tolerance Patient tolerated treatment well              Past Medical History:  Diagnosis Date   Depression    Headache    migraines   PONV (postoperative nausea and vomiting)    Past Surgical History:  Procedure Laterality Date   APPENDECTOMY     BREAST EXCISIONAL BIOPSY Right    benign   COLONOSCOPY     KNEE ARTHROSCOPY Right    x3   KNEE ARTHROSCOPY W/ MENISCAL REPAIR Left    LUMBAR LAMINECTOMY  1986   REDUCTION MAMMAPLASTY Bilateral    2006   ROTATOR CUFF REPAIR Right 2012   TONSILLECTOMY     TUBAL LIGATION     Patient Active Problem List   Diagnosis Date Noted   Anxiety 02/11/2021   Adjustment disorder with depressed mood 02/11/2021   Chronic kidney disease, stage 3 unspecified (HCC) 02/11/2021   Collagenous colitis 02/11/2021   Gastroesophageal reflux disease 02/11/2021   Migraine without aura, not refractory 02/11/2021   Mild depression 02/11/2021   Osteopenia 02/11/2021   Early satiety 01/12/2021   Bloating 01/12/2021   Abdominal pain 01/12/2021    PCP: Dr Sigmund Hazel  REFERRING PROVIDER: Dr Francena Hanly   REFERRING DIAG: Rt shoulder torn RC  THERAPY DIAG:  Acute pain of right shoulder  Other symptoms and signs involving the musculoskeletal system  Abnormal posture  Muscle weakness (generalized)  Rationale for Evaluation and Treatment: Rehabilitation  ONSET DATE: 02/25/23; surgery 03/21/23  SUBJECTIVE:                                                                                                                                                                                       SUBJECTIVE STATEMENT: Patient reports that she has had much less pain in the R shoulder and arm since last treatment. She has some pain in the elbow area but much less.  She is working on her exercises and using ice at home. She is careful with how she uses  her arm. It's just hurting more today.    Hand dominance: Right  PERTINENT HISTORY: Patient reports falling onto Rt shoulder 02/25/23 sustaining torn RC and biceps. She underwent surgery 03/21/23 for [facility] repair and biceps tendon repair.   Lt shoulder scope RC, Lt 05/25/17 DCR/SAD/frayed RC/torn labrum; Rt shoulder pain; cervical pain 2018; LBP 2020; Rt knee pain 2022 - denies any medical problems   PAIN:  Are you having pain? Yes: NPRS scale: 0/10 Pain location: Rt shoulder; elbow; hand  Pain description: tightness; dull; aching; sore - occasionally sharp Aggravating factors: moving  Relieving factors: meds; avoiding movement  PRECAUTIONS: Shoulder per protocol  WEIGHT BEARING RESTRICTIONS: No  FALLS:  Has patient fallen in last 6 months? No  LIVING ENVIRONMENT: Lives with: lives alone Lives in: House/apartment Stairs: Yes: Internal: 12 steps; on right going up and External: 3 steps; on right going up Has following equipment at home: Single point cane  OCCUPATION: Retired oncology RN 2021; active with household chores, gardening, yard work    PLOF: Independent  PATIENT GOALS:get the arm working like normal again   NEXT MD VISIT: 05/03/23  OBJECTIVE:   UPPER EXTREMITY ROM:   Active ROM Right eval Left eval  Shoulder flexion    Shoulder extension    Shoulder abduction    Shoulder adduction    Shoulder internal rotation    Shoulder external rotation    Elbow flexion    Elbow extension    Wrist flexion    Wrist extension    Wrist ulnar deviation    Wrist radial deviation    Wrist  pronation    Wrist supination    (Blank rows = not tested)  UPPER EXTREMITY MMT:  MMT Right eval Left eval  Shoulder flexion    Shoulder extension    Shoulder abduction    Shoulder adduction    Shoulder internal rotation    Shoulder external rotation    Middle trapezius    Lower trapezius    Elbow flexion    Elbow extension    Wrist flexion    Wrist extension    Wrist ulnar deviation    Wrist radial deviation    Wrist pronation    Wrist supination    Grip strength (lbs)    (Blank rows = not tested)  PALPATION:  Muscular tightness Rt shoulder girdle  05/16/23: muscular tightness Rt lateral cervical musculature    [facility] Adult PT Treatment:                                                DATE: 05/23/23 Therapeutic Exercise: (Reviewed exercises for home program - will hold prone exercises and continue with other exercises) Pendulum A/P, laterally, CW/CCW all x 20 Isometric row red TB 3 sec x 10 Isometric ER red TB R x 3 sec x 10  Isometric IR red TB R x 3 sec x 10  Standing shoulder flexion w/ noodle x 10  Standing scaption shoulder flexion w/ noodle x 10  Sidelying ER AROM x 10  Manual Therapy: (patient supine) Skilled palpation to assess response to manual work and DN PROM in supine within tissue tolerance and protocol guidelines  SMT Rt upper upper quarter  Modalities:  Vaso 34 deg; medium pressure x 10 min Lt shoulder; moist heat to cervical spine  [facility] Adult PT Treatment:  DATE: 05/18/23 Therapeutic Exercise: (Reviewed exercises for home program - will hold prone exercises and continue with other exercises) Pendulum A/P, laterally, CW/CCW all x 20 Pulley flexion 10 sec x 10  Pulley scaption 10 sec x 10  Pulley horizontal ab/ad at ~ 90 deg elevation Isometric row red TB 3 sec x 10 Isometric ER red TB R x 3 sec x 10  Isometric IR red TB R x 3 sec x 10  Manual Therapy: (patient supine) Skilled palpation to assess  response to manual work and DN PROM in supine within tissue tolerance and protocol guidelines  SMT Rt upper upper quarter Trigger Point Dry-Needling  Treatment instructions: Expect mild to moderate muscle soreness. S/S of pneumothorax if dry needled over a lung field, and to seek immediate medical attention should they occur. Patient verbalized understanding of these instructions and education.  Patient Consent Given: Yes Education handout provided: Previously provided Muscles treated: R lateral cervical and anterior fibers of upper trap; R extensor forearm  Electrical stimulation performed: No Parameters: N/A Treatment response/outcome: decreased palpable tightness and full resolution of radicular symptoms   Modalities:  Vaso 34 deg; medium pressure x 10 min Lt shoulder; moist heat to cervical spine   Therapeutic Exercise: (Reviewed exercises for home program) Pendulum A/P, laterally, CW/CCW all x 20 Pulley flexion 10 sec x 10  Pulley scaption 10 sec x 10  Pulley horizontal ab/ad at ~ 90 deg elevation Shoulder alphabet ball supported on table  ER with cane standing 10 sec x 10  Scap squeeze supine 10 sec x 10 Backwards shoulder rolls 3-5 sec x 10  Elbow flex/ext x 10 Pronation/supination x 10  Rolling green swiss ball into forward flexion x 10  Prone shoudler extension 3 sec x 10  Prone row 3 sec x 10  Biceps curl 1# x 10  Triceps extension supine 1# x 10    PATIENT EDUCATION: Education details: POC; HEP  Person educated: Patient Education method: Programmer, multimedia, Demonstration, Tactile cues, Verbal cues, and Handouts Education comprehension: verbalized understanding, returned demonstration, verbal cues required, tactile cues required, and needs further education  HOME EXERCISE PROGRAM: Access Code: [email or web address] URL: [email or web address] Date: 05/23/2023 Prepared by: Corlis Leak  Exercises - Flexion-Extension Shoulder Pendulum with Table Support  - 3-4 x daily  - 7 x weekly - 1 sets - 20-30 reps - Horizontal Shoulder Pendulum with Table Support  - 3-4 x daily - 7 x weekly - 1 sets - 20-30 reps - Circular Shoulder Pendulum with Table Support  - 3-4 x daily - 7 x weekly - 1 sets - 20-30 reps - Standing Shoulder Flexion AAROM with Swiss Ball  - 2 x daily - 7 x weekly - 1 sets - 10 reps - 5-10 sec  hold - Seated Scapular Retraction  - 2 x daily - 7 x weekly - 1-2 sets - 10 reps - 10 sec  hold - Standing Scapular Retraction  - 3 x daily - 7 x weekly - 1 sets - 10 reps - 10 sec  hold - Seated Shoulder Flexion AAROM with Pulley Behind  - 2 x daily - 7 x weekly - 1 sets - 10 reps - 10 sec  hold - Seated Shoulder Scaption AAROM with Pulley at Side  - 2 x daily - 7 x weekly - 1 sets - 10 reps - 10sec  hold - Seated Shoulder External Rotation AAROM with Cane and Hand in Neutral  - 2 x daily - 7 x weekly - 1  sets - 5-10 reps - 5-10 sec  hold - Shoulder Alphabet with Ball at Counter  - 2 x daily - 7 x weekly - 1 sets - Prone Shoulder Extension - Single Arm  - 1 x daily - 7 x weekly - 1-2 sets - 10 reps - 3  sec  hold - Prone Shoulder Row  - 1 x daily - 7 x weekly - 1-2 sets - 10 reps - 3 sec  hold - Seated Single Arm Bicep Curls Supinated with Dumbbell  - 1 x daily - 7 x weekly - 1-2 sets - 10 reps - 1-2 sec  hold - Supine Elbow Flexion Extension AROM  - 2 x daily - 7 x weekly - 2-3 sets - 10 reps - 3 sec  hold - Standing Shoulder Row Reactive Isometric  - 2 x daily - 7 x weekly - 1 sets - 10 reps - 30-45 sec  hold - Shoulder External Rotation Reactive Isometrics  - 1 x daily - 7 x weekly - 1 sets - 10 reps - 3-5 sec  hold - Shoulder Internal Rotation Reactive Isometrics  - 2 x daily - 7 x weekly - 1 sets - 10 reps - 3-5 sec  hold - Standing Shoulder Flexion to 90 Degrees with Dumbbells  - 1 x daily - 7 x weekly - 1-2 sets - 10 reps - 2 sec  hold - Scaption with Dumbbells  - 1 x daily - 7 x weekly - 1-2 sets - 10 reps - 2 sec  hold - Sidelying Shoulder External  Rotation AROM  - 1 x daily - 7 x weekly - 1-2 sets - 10 reps - 2-3 sec  hold   ASSESSMENT:  CLINICAL IMPRESSION: Excellent response to treatment addressing cervical tightness and radicular symptoms. Patient has continued tightness and some pain in the R shoulder and elbow but no numbness into the R thumb and index finger.  She will resume exercises from prior treatment sessions. Added isometric row, ER, IR. She will hold on prone exercises in case that position is irritating to cervical spine. Symptoms in Rt UE were resolved with manual work and DN to R cervical spine. Patient will continue to be consistent with HEP as noted above and trying to be careful with functional activities with Rt UE. Reviewed exercises for home. Continued with PROM/stretching with pt supine.Treatment included exercise; manual work; PROM; stretching; vaso. Decreased pain reported with treatment. Continue to focus on increasing joint mobility within parameters of protocol. Progressing well toward stated goals of therapy.    RTD 06/07/23  OBJECTIVE IMPAIRMENTS: decreased activity tolerance, decreased mobility, decreased ROM, decreased strength, hypomobility, increased fascial restrictions, increased muscle spasms, impaired flexibility, impaired UE functional use, improper body mechanics, postural dysfunction, and pain.     GOALS: Goals reviewed with patient? Yes  SHORT TERM GOALS: Target date: 05/09/2023   Independent in initial HEP  Baseline: Goal status: INITIAL  2.  Patient independent in donning and doffing sling  Baseline:  Goal status: INITIAL   LONG TERM GOALS: Target date: 06/20/2023   Rt shoulder ROM WFL's  Baseline:  Goal status: INITIAL  2.  Rt shoulder strength 4+/5 to 5/5 throughout  Baseline:  Goal status: INITIAL  3.  Improve posture and alignment with improved upright posture and posterior shoulder girdle engaged  Baseline:  Goal status: INITIAL  4.  Patient reports decreased pain in  Rt shoulder with functional activities to no more than 2/10  Baseline:  Goal  status: INITIAL  5.  Independent in HEP including aquatic program as indicated  Baseline:  Goal status: INITIAL  PLAN:  PT FREQUENCY: 2x/week  PT DURATION: 8 weeks  PLANNED INTERVENTIONS: Therapeutic exercises, Therapeutic activity, Neuromuscular re-education, Patient/Family education, Self Care, Joint mobilization, Aquatic Therapy, Dry Needling, Electrical stimulation, Spinal mobilization, Cryotherapy, Moist heat, Taping, Vasopneumatic device, Ultrasound, Ionotophoresis 4mg /ml Dexamethasone, Manual therapy, and Re-evaluation  PLAN FOR NEXT SESSION: review and progress exercise; continue with postural correction and education; manual work,DN, modalities as indicated    [employer], PT 05/23/2023, 10:59 AM

## 2023-05-25 ENCOUNTER — Encounter: Payer: Self-pay | Admitting: Rehabilitative and Restorative Service Providers"

## 2023-05-25 ENCOUNTER — Ambulatory Visit: Payer: Medicare HMO | Admitting: Rehabilitative and Restorative Service Providers"

## 2023-05-25 DIAGNOSIS — M6281 Muscle weakness (generalized): Secondary | ICD-10-CM | POA: Diagnosis not present

## 2023-05-25 DIAGNOSIS — R293 Abnormal posture: Secondary | ICD-10-CM

## 2023-05-25 DIAGNOSIS — M25511 Pain in right shoulder: Secondary | ICD-10-CM

## 2023-05-25 DIAGNOSIS — R29898 Other symptoms and signs involving the musculoskeletal system: Secondary | ICD-10-CM | POA: Diagnosis not present

## 2023-05-25 NOTE — Therapy (Signed)
OUTPATIENT PHYSICAL THERAPY SHOULDER TREATMENT    Patient Name: Mary Freeman MRN: [id number] DOB:Oct 16, 1951, 72 y.o., female Today's Date: 05/25/2023  END OF SESSION:  PT End of Session - 05/25/23 1102     Visit Number 16    Number of Visits 24    Date for PT Re-Evaluation 06/20/23    Authorization Type [employer] $20 copay    Authorization - Visit Number 16    Progress Note Due on Visit 20    PT Start Time 1100    PT Stop Time 1148    PT Time Calculation (min) 48 min    Activity Tolerance Patient tolerated treatment well              Past Medical History:  Diagnosis Date   Depression    Headache    migraines   PONV (postoperative nausea and vomiting)    Past Surgical History:  Procedure Laterality Date   APPENDECTOMY     BREAST EXCISIONAL BIOPSY Right    benign   COLONOSCOPY     KNEE ARTHROSCOPY Right    x3   KNEE ARTHROSCOPY W/ MENISCAL REPAIR Left    LUMBAR LAMINECTOMY  1986   REDUCTION MAMMAPLASTY Bilateral    2006   ROTATOR CUFF REPAIR Right 2012   TONSILLECTOMY     TUBAL LIGATION     Patient Active Problem List   Diagnosis Date Noted   Anxiety 02/11/2021   Adjustment disorder with depressed mood 02/11/2021   Chronic kidney disease, stage 3 unspecified (HCC) 02/11/2021   Collagenous colitis 02/11/2021   Gastroesophageal reflux disease 02/11/2021   Migraine without aura, not refractory 02/11/2021   Mild depression 02/11/2021   Osteopenia 02/11/2021   Early satiety 01/12/2021   Bloating 01/12/2021   Abdominal pain 01/12/2021    PCP: Dr Sigmund Hazel  REFERRING PROVIDER: Dr Francena Hanly   REFERRING DIAG: Rt shoulder torn RC  THERAPY DIAG:  Acute pain of right shoulder  Other symptoms and signs involving the musculoskeletal system  Abnormal posture  Muscle weakness (generalized)  Rationale for Evaluation and Treatment: Rehabilitation  ONSET DATE: 02/25/23; surgery 03/21/23  SUBJECTIVE:                                                                                                                                                                                       SUBJECTIVE STATEMENT: Patient reports that she has had much less pain in the R shoulder and arm since last treatment. She has some pain in the biceps area.  She is working on her exercises but can't use the pulley because it hurts her elbow. She is still using ice  at home. She is careful with how she uses her arm.     Hand dominance: Right  PERTINENT HISTORY: Patient reports falling onto Rt shoulder 02/25/23 sustaining torn RC and biceps. She underwent surgery 03/21/23 for [facility] repair and biceps tendon repair.   Lt shoulder scope RC, Lt 05/25/17 DCR/SAD/frayed RC/torn labrum; Rt shoulder pain; cervical pain 2018; LBP 2020; Rt knee pain 2022 - denies any medical problems   PAIN:  Are you having pain? Yes: NPRS scale: 0/10 Pain location: Rt shoulder; elbow; hand  Pain description: tightness; dull; aching; sore - occasionally sharp Aggravating factors: moving  Relieving factors: meds; avoiding movement  PRECAUTIONS: Shoulder per protocol  WEIGHT BEARING RESTRICTIONS: No  FALLS:  Has patient fallen in last 6 months? No  LIVING ENVIRONMENT: Lives with: lives alone Lives in: House/apartment Stairs: Yes: Internal: 12 steps; on right going up and External: 3 steps; on right going up Has following equipment at home: Single point cane  OCCUPATION: Retired oncology RN 2021; active with household chores, gardening, yard work    PLOF: Independent  PATIENT GOALS:get the arm working like normal again   NEXT MD VISIT: 05/03/23  OBJECTIVE:   UPPER EXTREMITY ROM:   Active ROM Right eval Left eval  Shoulder flexion    Shoulder extension    Shoulder abduction    Shoulder adduction    Shoulder internal rotation    Shoulder external rotation    Elbow flexion    Elbow extension    Wrist flexion    Wrist extension    Wrist ulnar deviation    Wrist radial  deviation    Wrist pronation    Wrist supination    (Blank rows = not tested)  UPPER EXTREMITY MMT:  MMT Right eval Left eval  Shoulder flexion    Shoulder extension    Shoulder abduction    Shoulder adduction    Shoulder internal rotation    Shoulder external rotation    Middle trapezius    Lower trapezius    Elbow flexion    Elbow extension    Wrist flexion    Wrist extension    Wrist ulnar deviation    Wrist radial deviation    Wrist pronation    Wrist supination    Grip strength (lbs)    (Blank rows = not tested)  PALPATION:  Muscular tightness Rt shoulder girdle  05/16/23: muscular tightness Rt lateral cervical musculature    [facility] Adult PT Treatment:                                                DATE: 05/25/23 Therapeutic Exercise: (Reviewed exercises for home program - will hold prone exercises and continue with other exercises) Pendulum A/P, laterally, CW/CCW all x 20 Biceps stretch standing at table 15 sec x 3  Shoulder flexion hands at wall sliding hand up the wall 5 sec x 10  Isometric row red TB 3 sec x 10 Isometric ER red TB R x 3 sec x 10  Isometric IR red TB R x 3 sec x 10  Standing shoulder flexion w/ noodle x 10  Standing scaption shoulder flexion w/ noodle x 10  Sidelying ER AROM x 10 Standing shoulder flexion bilat pillow case on wall 3 sec x 10  Supine rhythmic stabilization fwd/back; horizontal ab/ad; circles CW/CCW x 15-20  Manual Therapy: (patient  supine) Skilled palpation to assess response to manual work and DN PROM in supine within tissue tolerance and protocol guidelines  SMT Rt upper upper quarter  Modalities:  Vaso 34 deg; medium pressure x 10 min Lt shoulder; moist heat to cervical spine  [facility] Adult PT Treatment:                                                DATE: 05/23/23 Therapeutic Exercise: (Reviewed exercises for home program - will hold prone exercises and continue with other exercises) Pendulum A/P, laterally, CW/CCW all x  20 Isometric row red TB 3 sec x 10 Isometric ER red TB R x 3 sec x 10  Isometric IR red TB R x 3 sec x 10  Standing shoulder flexion w/ noodle x 10  Standing scaption shoulder flexion w/ noodle x 10  Sidelying ER AROM x 10  Manual Therapy: (patient supine) Skilled palpation to assess response to manual work and DN PROM in supine within tissue tolerance and protocol guidelines  SMT Rt upper upper quarter  Modalities:  Vaso 34 deg; medium pressure x 10 min Lt shoulder; moist heat to cervical spine  [facility] Adult PT Treatment:     (Dry needling)      DATE: 05/18/23 Therapeutic Exercise: (Reviewed exercises for home program - will hold prone exercises and continue with other exercises) Pendulum A/P, laterally, CW/CCW all x 20 Pulley flexion 10 sec x 10  Pulley scaption 10 sec x 10  Pulley horizontal ab/ad at ~ 90 deg elevation Isometric row red TB 3 sec x 10 Isometric ER red TB R x 3 sec x 10  Isometric IR red TB R x 3 sec x 10  Manual Therapy: (patient supine) Skilled palpation to assess response to manual work and DN PROM in supine within tissue tolerance and protocol guidelines  SMT Rt upper upper quarter Trigger Point Dry-Needling  Treatment instructions: Expect mild to moderate muscle soreness. S/S of pneumothorax if dry needled over a lung field, and to seek immediate medical attention should they occur. Patient verbalized understanding of these instructions and education.  Patient Consent Given: Yes Education handout provided: Previously provided Muscles treated: R lateral cervical and anterior fibers of upper trap; R extensor forearm  Electrical stimulation performed: No Parameters: N/A Treatment response/outcome: decreased palpable tightness and full resolution of radicular symptoms   Modalities:  Vaso 34 deg; medium pressure x 10 min Lt shoulder; moist heat to cervical spine   Therapeutic Exercise: (Reviewed exercises for home program) Pendulum A/P, laterally, CW/CCW  all x 20 Pulley flexion 10 sec x 10  Pulley scaption 10 sec x 10  Pulley horizontal ab/ad at ~ 90 deg elevation Shoulder alphabet ball supported on table  ER with cane standing 10 sec x 10  Scap squeeze supine 10 sec x 10 Backwards shoulder rolls 3-5 sec x 10  Elbow flex/ext x 10 Pronation/supination x 10  Rolling green swiss ball into forward flexion x 10  Prone shoudler extension 3 sec x 10  Prone row 3 sec x 10  Biceps curl 1# x 10  Triceps extension supine 1# x 10  Biceps stretch standing 15 sec x 3 Shoulder flexion hands up wall x 10  Rhythmic stabilization supine multiple directions    PATIENT EDUCATION: Education details: POC; HEP  Person educated: Patient Education method: Programmer, multimedia, Facilities manager, Actor  cues, Verbal cues, and Handouts Education comprehension: verbalized understanding, returned demonstration, verbal cues required, tactile cues required, and needs further education  HOME EXERCISE PROGRAM:  Access Code: [email or web address] URL: [email or web address] Date: 05/25/2023 Prepared by: Corlis Leak  Exercises - Flexion-Extension Shoulder Pendulum with Table Support  - 3-4 x daily - 7 x weekly - 1 sets - 20-30 reps - Horizontal Shoulder Pendulum with Table Support  - 3-4 x daily - 7 x weekly - 1 sets - 20-30 reps - Circular Shoulder Pendulum with Table Support  - 3-4 x daily - 7 x weekly - 1 sets - 20-30 reps - Standing Shoulder Flexion AAROM with Swiss Ball  - 2 x daily - 7 x weekly - 1 sets - 10 reps - 5-10 sec  hold - Seated Scapular Retraction  - 2 x daily - 7 x weekly - 1-2 sets - 10 reps - 10 sec  hold - Standing Scapular Retraction  - 3 x daily - 7 x weekly - 1 sets - 10 reps - 10 sec  hold - Seated Shoulder Flexion AAROM with Pulley Behind  - 2 x daily - 7 x weekly - 1 sets - 10 reps - 10 sec  hold - Seated Shoulder Scaption AAROM with Pulley at Side  - 2 x daily - 7 x weekly - 1 sets - 10 reps - 10sec  hold - Seated Shoulder External Rotation  AAROM with Cane and Hand in Neutral  - 2 x daily - 7 x weekly - 1 sets - 5-10 reps - 5-10 sec  hold - Shoulder Alphabet with Ball at Counter  - 2 x daily - 7 x weekly - 1 sets - Prone Shoulder Extension - Single Arm  - 1 x daily - 7 x weekly - 1-2 sets - 10 reps - 3  sec  hold - Prone Shoulder Row  - 1 x daily - 7 x weekly - 1-2 sets - 10 reps - 3 sec  hold - Seated Single Arm Bicep Curls Supinated with Dumbbell  - 1 x daily - 7 x weekly - 1-2 sets - 10 reps - 1-2 sec  hold - Supine Elbow Flexion Extension AROM  - 2 x daily - 7 x weekly - 2-3 sets - 10 reps - 3 sec  hold - Standing Shoulder Row Reactive Isometric  - 2 x daily - 7 x weekly - 1 sets - 10 reps - 30-45 sec  hold - Shoulder External Rotation Reactive Isometrics  - 1 x daily - 7 x weekly - 1 sets - 10 reps - 3-5 sec  hold - Shoulder Internal Rotation Reactive Isometrics  - 2 x daily - 7 x weekly - 1 sets - 10 reps - 3-5 sec  hold - Standing Shoulder Flexion to 90 Degrees with Dumbbells  - 1 x daily - 7 x weekly - 1-2 sets - 10 reps - 2 sec  hold - Scaption with Dumbbells  - 1 x daily - 7 x weekly - 1-2 sets - 10 reps - 2 sec  hold - Sidelying Shoulder External Rotation AROM  - 1 x daily - 7 x weekly - 1-2 sets - 10 reps - 2-3 sec  hold - Bicep Stretch at Table  - 2 x daily - 7 x weekly - 1 sets - 3 reps - 30 sec  hold - Standing Shoulder Flexion Stretch on Wall  - 2 x daily - 7 x weekly - 2 sets - 10 reps -  3-5 sec  hold - Supine Shoulder Rhythmic Stabilization- Horizontal Abduction/Adduction  - 2 x daily - 7 x weekly - 1 sets - 5-10 reps  ASSESSMENT:  CLINICAL IMPRESSION: Some persistent tightness and discomfort in R biceps. Good response to gentle stretching for biceps. Reviewed and progressed exercises for ROM and stabilization R shoulder. Continued with manual work and PROM. Progressing well toward stated goals of therapy.   RTD 06/07/23  OBJECTIVE IMPAIRMENTS: decreased activity tolerance, decreased mobility, decreased ROM,  decreased strength, hypomobility, increased fascial restrictions, increased muscle spasms, impaired flexibility, impaired UE functional use, improper body mechanics, postural dysfunction, and pain.     GOALS: Goals reviewed with patient? Yes  SHORT TERM GOALS: Target date: 05/09/2023   Independent in initial HEP  Baseline: Goal status: INITIAL  2.  Patient independent in donning and doffing sling  Baseline:  Goal status: INITIAL   LONG TERM GOALS: Target date: 06/20/2023   Rt shoulder ROM WFL's  Baseline:  Goal status: INITIAL  2.  Rt shoulder strength 4+/5 to 5/5 throughout  Baseline:  Goal status: INITIAL  3.  Improve posture and alignment with improved upright posture and posterior shoulder girdle engaged  Baseline:  Goal status: INITIAL  4.  Patient reports decreased pain in Rt shoulder with functional activities to no more than 2/10  Baseline:  Goal status: INITIAL  5.  Independent in HEP including aquatic program as indicated  Baseline:  Goal status: INITIAL  PLAN:  PT FREQUENCY: 2x/week  PT DURATION: 8 weeks  PLANNED INTERVENTIONS: Therapeutic exercises, Therapeutic activity, Neuromuscular re-education, Patient/Family education, Self Care, Joint mobilization, Aquatic Therapy, Dry Needling, Electrical stimulation, Spinal mobilization, Cryotherapy, Moist heat, Taping, Vasopneumatic device, Ultrasound, Ionotophoresis 4mg /ml Dexamethasone, Manual therapy, and Re-evaluation  PLAN FOR NEXT SESSION: review and progress exercise; continue with postural correction and education; manual work,DN, modalities as indicated    [employer], PT 05/25/2023, 11:02 AM

## 2023-05-29 ENCOUNTER — Encounter: Payer: Self-pay | Admitting: Rehabilitative and Restorative Service Providers"

## 2023-05-29 ENCOUNTER — Ambulatory Visit: Payer: Medicare HMO | Attending: Orthopedic Surgery | Admitting: Rehabilitative and Restorative Service Providers"

## 2023-05-29 DIAGNOSIS — M6281 Muscle weakness (generalized): Secondary | ICD-10-CM | POA: Insufficient documentation

## 2023-05-29 DIAGNOSIS — R29898 Other symptoms and signs involving the musculoskeletal system: Secondary | ICD-10-CM | POA: Diagnosis not present

## 2023-05-29 DIAGNOSIS — R293 Abnormal posture: Secondary | ICD-10-CM | POA: Insufficient documentation

## 2023-05-29 DIAGNOSIS — M25511 Pain in right shoulder: Secondary | ICD-10-CM | POA: Diagnosis not present

## 2023-05-29 NOTE — Therapy (Signed)
OUTPATIENT PHYSICAL THERAPY SHOULDER TREATMENT    Patient Name: Mary Freeman MRN: [id number] DOB:June 12, 1951, 72 y.o., female Today's Date: 05/29/2023  END OF SESSION:  PT End of Session - 05/29/23 1453     Visit Number 17    Number of Visits 24    Date for PT Re-Evaluation 06/20/23    Authorization Type [employer] $20 copay    Authorization - Visit Number 17    Progress Note Due on Visit 20    PT Start Time 1447    PT Stop Time 1536    PT Time Calculation (min) 49 min              Past Medical History:  Diagnosis Date   Depression    Headache    migraines   PONV (postoperative nausea and vomiting)    Past Surgical History:  Procedure Laterality Date   APPENDECTOMY     BREAST EXCISIONAL BIOPSY Right    benign   COLONOSCOPY     KNEE ARTHROSCOPY Right    x3   KNEE ARTHROSCOPY W/ MENISCAL REPAIR Left    LUMBAR LAMINECTOMY  1986   REDUCTION MAMMAPLASTY Bilateral    2006   ROTATOR CUFF REPAIR Right 2012   TONSILLECTOMY     TUBAL LIGATION     Patient Active Problem List   Diagnosis Date Noted   Anxiety 02/11/2021   Adjustment disorder with depressed mood 02/11/2021   Chronic kidney disease, stage 3 unspecified (HCC) 02/11/2021   Collagenous colitis 02/11/2021   Gastroesophageal reflux disease 02/11/2021   Migraine without aura, not refractory 02/11/2021   Mild depression 02/11/2021   Osteopenia 02/11/2021   Early satiety 01/12/2021   Bloating 01/12/2021   Abdominal pain 01/12/2021    PCP: Dr Mary Freeman  REFERRING PROVIDER: Dr Mary Freeman   REFERRING DIAG: Rt shoulder torn RC  THERAPY DIAG:  Acute pain of right shoulder  Other symptoms and signs involving the musculoskeletal system  Abnormal posture  Muscle weakness (generalized)  Rationale for Evaluation and Treatment: Rehabilitation  ONSET DATE: 02/25/23; surgery 03/21/23  SUBJECTIVE:                                                                                                                                                                                       SUBJECTIVE STATEMENT: Mary Freeman reports that she fell today when a chipmunk ran across her foot. She skinned the L forearm but did not hurt the R shoulder. Patient reports that she has less pain in the R shoulder and arm. She has some intermittent pain and discomfort in the biceps area. She treats biceps pain with ice. She is  working on her exercises but can't use the pulley because it hurts her elbow.  Hand dominance: Right  PERTINENT HISTORY: Patient reports falling onto Rt shoulder 02/25/23 sustaining torn RC and biceps. She underwent surgery 03/21/23 for Mary Freeman repair and biceps tendon repair.   Lt shoulder scope RC, Lt 05/25/17 DCR/SAD/frayed RC/torn labrum; Rt shoulder pain; cervical pain 2018; LBP 2020; Rt knee pain 2022 - denies any medical problems   PAIN:  Are you having pain? Yes: NPRS scale: 0/10 Pain location: Rt shoulder; elbow; hand  Pain description: tightness; dull; aching; sore - occasionally sharp Aggravating factors: moving  Relieving factors: meds; avoiding movement  PRECAUTIONS: Shoulder per protocol  WEIGHT BEARING RESTRICTIONS: No  FALLS:  Has patient fallen in last 6 months? No  LIVING ENVIRONMENT: Lives with: lives alone Lives in: House/apartment Stairs: Yes: Internal: 12 steps; on right going up and External: 3 steps; on right going up Has following equipment at home: Single point cane  OCCUPATION: Retired oncology RN 2021; active with household chores, gardening, yard work    PLOF: Independent  PATIENT GOALS:get the arm working like normal again   NEXT MD VISIT: 05/03/23  OBJECTIVE:   UPPER EXTREMITY ROM:   Active ROM Right eval Left eval  Shoulder flexion    Shoulder extension    Shoulder abduction    Shoulder adduction    Shoulder internal rotation    Shoulder external rotation    Elbow flexion    Elbow extension    Wrist flexion    Wrist extension    Wrist ulnar  deviation    Wrist radial deviation    Wrist pronation    Wrist supination    (Blank rows = not tested)  UPPER EXTREMITY MMT:  MMT Right eval Left eval  Shoulder flexion    Shoulder extension    Shoulder abduction    Shoulder adduction    Shoulder internal rotation    Shoulder external rotation    Middle trapezius    Lower trapezius    Elbow flexion    Elbow extension    Wrist flexion    Wrist extension    Wrist ulnar deviation    Wrist radial deviation    Wrist pronation    Wrist supination    Grip strength (lbs)    (Blank rows = not tested)  PALPATION:  Muscular tightness Rt shoulder girdle  05/16/23: muscular tightness Rt lateral cervical musculature    [facility] Adult PT Treatment:                                                DATE: 05/29/23 Therapeutic Exercise: (Reviewed exercises for home program - will hold prone exercises and continue with other exercises) Biceps stretch standing at table 15 sec x 3  Shoulder flexion hands at wall rolling foam roll up wall 5 sec x 10  Isometric row green TB 3 sec x 10 Isometric ER green TB R x 3 sec x 10  Isometric IR green TB R x 3 sec x 10  Standing shoulder flexion w/ noodle x 10  Standing scaption shoulder flexion w/ noodle x 10  Sidelying ER AROM x 10  Supine rhythmic stabilization fwd/back; horizontal ab/ad; circles CW/CCW x 15-20  Manual Therapy: (patient supine) Skilled palpation to assess response to manual work and DN PROM in supine within tissue tolerance and protocol  guidelines  SMT Rt upper upper quarter  Modalities:  Vaso 34 deg; medium pressure x 10 min Lt shoulder; moist heat to cervical spine   [facility] Adult PT Treatment:                                                DATE: 05/25/23 Therapeutic Exercise: (Reviewed exercises for home program - will hold prone exercises and continue with other exercises)  Pendullum CW/CCW Biceps stretch standing at table 15 sec x 3  Shoulder flexion hands at wall sliding hand  up the wall 5 sec x 10  Isometric row green TB 3 sec x 10 Isometric ER red TB R x 3 sec x 10  Isometric IR red TB R x 3 sec x 10  Standing shoulder flexion w/ noodle x 10  Standing scaption shoulder flexion w/ noodle x 10  Sidelying ER AROM x 10 Standing shoulder flexion bilat pillow case on wall 3 sec x 10  Supine rhythmic stabilization fwd/back; horizontal ab/ad; circles CW/CCW x 15-20  Manual Therapy: (patient supine) Skilled palpation to assess response to manual work and DN PROM in supine within tissue tolerance and protocol guidelines  SMT Rt upper upper quarter  Modalities:  Vaso 34 deg; medium pressure x 10 min Lt shoulder; moist heat to cervical spine  [facility] Adult PT Treatment:     (Dry needling)      DATE: 05/18/23 Therapeutic Exercise: (Reviewed exercises for home program - will hold prone exercises and continue with other exercises) Pendulum A/P, laterally, CW/CCW all x 20 Pulley flexion 10 sec x 10  Pulley scaption 10 sec x 10  Pulley horizontal ab/ad at ~ 90 deg elevation Isometric row red TB 3 sec x 10 Isometric ER red TB R x 3 sec x 10  Isometric IR red TB R x 3 sec x 10  Manual Therapy: (patient supine) Skilled palpation to assess response to manual work and DN PROM in supine within tissue tolerance and protocol guidelines  SMT Rt upper upper quarter Trigger Point Dry-Needling  Treatment instructions: Expect mild to moderate muscle soreness. S/S of pneumothorax if dry needled over a lung field, and to seek immediate medical attention should they occur. Patient verbalized understanding of these instructions and education.  Patient Consent Given: Yes Education handout provided: Previously provided Muscles treated: R lateral cervical and anterior fibers of upper trap; R extensor forearm  Electrical stimulation performed: No Parameters: N/A Treatment response/outcome: decreased palpable tightness and full resolution of radicular symptoms   Modalities:  Vaso 34  deg; medium pressure x 10 min Lt shoulder; moist heat to cervical spine   Therapeutic Exercise: (Reviewed exercises for home program) Pendulum A/P, laterally, CW/CCW all x 20 Pulley flexion 10 sec x 10  Pulley scaption 10 sec x 10  Pulley horizontal ab/ad at ~ 90 deg elevation Shoulder alphabet ball supported on table  ER with cane standing 10 sec x 10  Scap squeeze supine 10 sec x 10 Backwards shoulder rolls 3-5 sec x 10  Elbow flex/ext x 10 Pronation/supination x 10  Rolling green swiss ball into forward flexion x 10  Prone shoudler extension 3 sec x 10  Prone row 3 sec x 10  Biceps curl 1# x 10  Triceps extension supine 1# x 10  Biceps stretch standing 15 sec x 3 Shoulder flexion hands up wall  x 10  Rhythmic stabilization supine multiple directions    PATIENT EDUCATION: Education details: POC; HEP  Person educated: Patient Education method: Explanation, Demonstration, Tactile cues, Verbal cues, and Handouts Education comprehension: verbalized understanding, returned demonstration, verbal cues required, tactile cues required, and needs further education  HOME EXERCISE PROGRAM:  Access Code: [email or web address] URL: [email or web address] Date: 05/25/2023 Prepared by: Corlis Leak  Exercises - Flexion-Extension Shoulder Pendulum with Table Support  - 3-4 x daily - 7 x weekly - 1 sets - 20-30 reps - Horizontal Shoulder Pendulum with Table Support  - 3-4 x daily - 7 x weekly - 1 sets - 20-30 reps - Circular Shoulder Pendulum with Table Support  - 3-4 x daily - 7 x weekly - 1 sets - 20-30 reps - Standing Shoulder Flexion AAROM with Swiss Ball  - 2 x daily - 7 x weekly - 1 sets - 10 reps - 5-10 sec  hold - Seated Scapular Retraction  - 2 x daily - 7 x weekly - 1-2 sets - 10 reps - 10 sec  hold - Standing Scapular Retraction  - 3 x daily - 7 x weekly - 1 sets - 10 reps - 10 sec  hold - Seated Shoulder Flexion AAROM with Pulley Behind  - 2 x daily - 7 x weekly - 1 sets - 10  reps - 10 sec  hold - Seated Shoulder Scaption AAROM with Pulley at Side  - 2 x daily - 7 x weekly - 1 sets - 10 reps - 10sec  hold - Seated Shoulder External Rotation AAROM with Cane and Hand in Neutral  - 2 x daily - 7 x weekly - 1 sets - 5-10 reps - 5-10 sec  hold - Shoulder Alphabet with Ball at Counter  - 2 x daily - 7 x weekly - 1 sets - Prone Shoulder Extension - Single Arm  - 1 x daily - 7 x weekly - 1-2 sets - 10 reps - 3  sec  hold - Prone Shoulder Row  - 1 x daily - 7 x weekly - 1-2 sets - 10 reps - 3 sec  hold - Seated Single Arm Bicep Curls Supinated with Dumbbell  - 1 x daily - 7 x weekly - 1-2 sets - 10 reps - 1-2 sec  hold - Supine Elbow Flexion Extension AROM  - 2 x daily - 7 x weekly - 2-3 sets - 10 reps - 3 sec  hold - Standing Shoulder Row Reactive Isometric  - 2 x daily - 7 x weekly - 1 sets - 10 reps - 30-45 sec  hold - Shoulder External Rotation Reactive Isometrics  - 1 x daily - 7 x weekly - 1 sets - 10 reps - 3-5 sec  hold - Shoulder Internal Rotation Reactive Isometrics  - 2 x daily - 7 x weekly - 1 sets - 10 reps - 3-5 sec  hold - Standing Shoulder Flexion to 90 Degrees with Dumbbells  - 1 x daily - 7 x weekly - 1-2 sets - 10 reps - 2 sec  hold - Scaption with Dumbbells  - 1 x daily - 7 x weekly - 1-2 sets - 10 reps - 2 sec  hold - Sidelying Shoulder External Rotation AROM  - 1 x daily - 7 x weekly - 1-2 sets - 10 reps - 2-3 sec  hold - Bicep Stretch at Table  - 2 x daily - 7 x weekly - 1 sets - 3 reps -  30 sec  hold - Standing Shoulder Flexion Stretch on Wall  - 2 x daily - 7 x weekly - 2 sets - 10 reps - 3-5 sec  hold - Supine Shoulder Rhythmic Stabilization- Horizontal Abduction/Adduction  - 2 x daily - 7 x weekly - 1 sets - 5-10 reps  ASSESSMENT:  CLINICAL IMPRESSION: Some persistent tightness and discomfort in R biceps. Reviewed and progressed exercises for ROM and stabilization R shoulder. Continued with manual work and PROM. Progressing well toward stated goals  of therapy.   RTD 06/07/23  OBJECTIVE IMPAIRMENTS: decreased activity tolerance, decreased mobility, decreased ROM, decreased strength, hypomobility, increased fascial restrictions, increased muscle spasms, impaired flexibility, impaired UE functional use, improper body mechanics, postural dysfunction, and pain.     GOALS: Goals reviewed with patient? Yes  SHORT TERM GOALS: Target date: 05/09/2023   Independent in initial HEP  Baseline: Goal status: INITIAL  2.  Patient independent in donning and doffing sling  Baseline:  Goal status: INITIAL   LONG TERM GOALS: Target date: 06/20/2023   Rt shoulder ROM WFL's  Baseline:  Goal status: INITIAL  2.  Rt shoulder strength 4+/5 to 5/5 throughout  Baseline:  Goal status: INITIAL  3.  Improve posture and alignment with improved upright posture and posterior shoulder girdle engaged  Baseline:  Goal status: INITIAL  4.  Patient reports decreased pain in Rt shoulder with functional activities to no more than 2/10  Baseline:  Goal status: INITIAL  5.  Independent in HEP including aquatic program as indicated  Baseline:  Goal status: INITIAL  PLAN:  PT FREQUENCY: 2x/week  PT DURATION: 8 weeks  PLANNED INTERVENTIONS: Therapeutic exercises, Therapeutic activity, Neuromuscular re-education, Patient/Family education, Self Care, Joint mobilization, Aquatic Therapy, Dry Needling, Electrical stimulation, Spinal mobilization, Cryotherapy, Moist heat, Taping, Vasopneumatic device, Ultrasound, Ionotophoresis 4mg /ml Dexamethasone, Manual therapy, and Re-evaluation  PLAN FOR NEXT SESSION: review and progress exercise; continue with postural correction and education; manual work,DN, modalities as indicated     Rober Minion, PT 05/29/2023, 3:31 PM

## 2023-05-31 ENCOUNTER — Encounter: Payer: Self-pay | Admitting: Rehabilitative and Restorative Service Providers"

## 2023-05-31 ENCOUNTER — Ambulatory Visit: Payer: Medicare HMO | Admitting: Rehabilitative and Restorative Service Providers"

## 2023-05-31 DIAGNOSIS — R293 Abnormal posture: Secondary | ICD-10-CM | POA: Diagnosis not present

## 2023-05-31 DIAGNOSIS — M25511 Pain in right shoulder: Secondary | ICD-10-CM | POA: Diagnosis not present

## 2023-05-31 DIAGNOSIS — M6281 Muscle weakness (generalized): Secondary | ICD-10-CM

## 2023-05-31 DIAGNOSIS — R29898 Other symptoms and signs involving the musculoskeletal system: Secondary | ICD-10-CM

## 2023-05-31 NOTE — Therapy (Signed)
OUTPATIENT PHYSICAL THERAPY SHOULDER TREATMENT    Patient Name: Mary Freeman MRN: [id number] DOB:[birth year], 72 y.o., female Today's Date: 05/31/2023  END OF SESSION:  PT End of Session - 05/31/23 1153     Visit Number 18    Number of Visits 24    Date for PT Re-Evaluation 06/20/23    Authorization Type [employer] medicare $20 copay    Authorization - Visit Number 18    Progress Note Due on Visit 20    PT Start Time 1145    PT Stop Time 1233    PT Time Calculation (min) 48 min    Activity Tolerance Patient tolerated treatment well              Past Medical History:  Diagnosis Date   Depression    Headache    migraines   PONV (postoperative nausea and vomiting)    Past Surgical History:  Procedure Laterality Date   APPENDECTOMY     BREAST EXCISIONAL BIOPSY Right    benign   COLONOSCOPY     KNEE ARTHROSCOPY Right    x3   KNEE ARTHROSCOPY W/ MENISCAL REPAIR Left    LUMBAR LAMINECTOMY  1986   REDUCTION MAMMAPLASTY Bilateral    2006   ROTATOR CUFF REPAIR Right 2012   TONSILLECTOMY     TUBAL LIGATION     Patient Active Problem List   Diagnosis Date Noted   Anxiety 02/11/2021   Adjustment disorder with depressed mood 02/11/2021   Chronic kidney disease, stage 3 unspecified (HCC) 02/11/2021   Collagenous colitis 02/11/2021   Gastroesophageal reflux disease 02/11/2021   Migraine without aura, not refractory 02/11/2021   Mild depression 02/11/2021   Osteopenia 02/11/2021   Early satiety 01/12/2021   Bloating 01/12/2021   Abdominal pain 01/12/2021    PCP: Dr Sigmund Hazel  REFERRING PROVIDER: Dr Francena Hanly   REFERRING DIAG: Rt shoulder torn RC  THERAPY DIAG:  Acute pain of right shoulder  Other symptoms and signs involving the musculoskeletal system  Abnormal posture  Muscle weakness (generalized)  Rationale for Evaluation and Treatment: Rehabilitation  ONSET DATE: 02/25/23; surgery 03/21/23  SUBJECTIVE:                                                                                                                                                                                       SUBJECTIVE STATEMENT: [name] reports that her L arm hurts where she skinned the L forearm but did not hurt the R shoulder. Patient reports that she has some pain in the R shoulder and biceps area mostly in the morning when she wakes up. She has some intermittent pain and  discomfort in the biceps area. She treats biceps pain with ice. She is working on her exercises but can't use the pulley because it hurts her elbow.  Hand dominance: Right  PERTINENT HISTORY: Patient reports falling onto Rt shoulder 02/25/23 sustaining torn RC and biceps. She underwent surgery 03/21/23 for [facility] repair and biceps tendon repair.   Lt shoulder scope RC, Lt 05/25/17 DCR/SAD/frayed RC/torn labrum; Rt shoulder pain; cervical pain 2018; LBP 2020; Rt knee pain 2022 - denies any medical problems   PAIN:  Are you having pain? Yes: NPRS scale: 0/10 Pain location: Rt shoulder; elbow; hand  Pain description: tightness; dull; aching; sore - occasionally sharp Aggravating factors: moving  Relieving factors: meds; avoiding movement  PRECAUTIONS: Shoulder per protocol  WEIGHT BEARING RESTRICTIONS: No  FALLS:  Has patient fallen in last 6 months? No  LIVING ENVIRONMENT: Lives with: lives alone Lives in: House/apartment Stairs: Yes: Internal: 12 steps; on right going up and External: 3 steps; on right going up Has following equipment at home: Single point cane  OCCUPATION: Retired oncology RN 2021; active with household chores, gardening, yard work    PLOF: Independent  PATIENT GOALS:get the arm working like normal again   NEXT MD VISIT: 05/03/23  OBJECTIVE:   UPPER EXTREMITY ROM:   Active ROM Right eval Left eval  Shoulder flexion    Shoulder extension    Shoulder abduction    Shoulder adduction    Shoulder internal rotation    Shoulder external rotation    Elbow flexion     Elbow extension    Wrist flexion    Wrist extension    Wrist ulnar deviation    Wrist radial deviation    Wrist pronation    Wrist supination    (Blank rows = not tested)  UPPER EXTREMITY MMT:  MMT Right eval Left eval  Shoulder flexion    Shoulder extension    Shoulder abduction    Shoulder adduction    Shoulder internal rotation    Shoulder external rotation    Middle trapezius    Lower trapezius    Elbow flexion    Elbow extension    Wrist flexion    Wrist extension    Wrist ulnar deviation    Wrist radial deviation    Wrist pronation    Wrist supination    Grip strength (lbs)    (Blank rows = not tested)  PALPATION:  Muscular tightness Rt shoulder girdle  05/16/23: muscular tightness Rt lateral cervical musculature    [facility] Adult PT Treatment:                                                DATE: 05/31/23 Therapeutic Exercise: (will hold prone exercises and continue with other exercises) Biceps stretch standing at table 15 sec x 3  Shoulder flexion hands at wall rolling foam roll up wall 5 sec x 10  Isometric row green TB 3 sec x 10 Isometric ER green TB R x 3 sec x 10  Isometric IR green TB R x 3 sec x 10  Standing shoulder flexion w/ noodle x 10  Standing scaption shoulder flexion w/ noodle x 10  Sidelying ER AROM x 10  Sidelying abduction AROM x 10  Sidelying flexion AROM x 10  Supine rhythmic stabilization fwd/back; horizontal ab/ad; circles CW/CCW x 15-20  Triceps extension yellow  TB x 10  Biceps curl 1# DB x 10  Manual Therapy: (patient supine) Skilled palpation to assess response to manual work and DN PROM in supine within tissue tolerance and protocol guidelines  SMT Rt upper upper quarter  Modalities:  Vaso 34 deg; medium pressure x 10 min Lt shoulder; moist heat to cervical spine   [facility] Adult PT Treatment:                                                DATE: 05/29/23 Therapeutic Exercise: Biceps stretch standing at table 15 sec x 3  Shoulder  flexion hands at wall rolling foam roll up wall 5 sec x 10  Isometric row green TB 3 sec x 10 Isometric ER green TB R x 3 sec x 10  Isometric IR green TB R x 3 sec x 10  Standing shoulder flexion w/ noodle x 10  Standing scaption shoulder flexion w/ noodle x 10  Sidelying ER AROM x 10  Supine rhythmic stabilization fwd/back; horizontal ab/ad; circles CW/CCW x 15-20  Manual Therapy: (patient supine) Skilled palpation to assess response to manual work  PROM in supine within tissue tolerance and protocol guidelines  SMT Rt upper upper quarter  Modalities:  Vaso 34 deg; medium pressure x 10 min Lt shoulder; moist heat to cervical spine   [facility] Adult PT Treatment:     (Dry needling)      DATE: 05/18/23 Therapeutic Exercise: (Reviewed exercises for home program - will hold prone exercises and continue with other exercises) Pendulum A/P, laterally, CW/CCW all x 20 Pulley flexion 10 sec x 10  Pulley scaption 10 sec x 10  Pulley horizontal ab/ad at ~ 90 deg elevation Isometric row red TB 3 sec x 10 Isometric ER red TB R x 3 sec x 10  Isometric IR red TB R x 3 sec x 10  Manual Therapy: (patient supine) Skilled palpation to assess response to manual work and DN PROM in supine within tissue tolerance and protocol guidelines  SMT Rt upper upper quarter Trigger Point Dry-Needling  Treatment instructions: Expect mild to moderate muscle soreness. S/S of pneumothorax if dry needled over a lung field, and to seek immediate medical attention should they occur. Patient verbalized understanding of these instructions and education.  Patient Consent Given: Yes Education handout provided: Previously provided Muscles treated: R lateral cervical and anterior fibers of upper trap; R extensor forearm  Electrical stimulation performed: No Parameters: N/A Treatment response/outcome: decreased palpable tightness and full resolution of radicular symptoms   Modalities:  Vaso 34 deg; medium pressure x 10 min  Lt shoulder; moist heat to cervical spine   Therapeutic Exercise: (Reviewed exercises for home program) Pendulum A/P, laterally, CW/CCW all x 20 Pulley flexion 10 sec x 10  Pulley scaption 10 sec x 10  Pulley horizontal ab/ad at ~ 90 deg elevation Shoulder alphabet ball supported on table  ER with cane standing 10 sec x 10  Scap squeeze supine 10 sec x 10 Backwards shoulder rolls 3-5 sec x 10  Elbow flex/ext x 10 Pronation/supination x 10  Rolling green swiss ball into forward flexion x 10  Prone shoudler extension 3 sec x 10  Prone row 3 sec x 10  Biceps curl 1# x 10  Triceps extension supine 1# x 10  Biceps stretch standing 15 sec x 3 Shoulder flexion  hands up wall x 10  Rhythmic stabilization supine multiple directions    PATIENT EDUCATION: Education details: POC; HEP  Person educated: Patient Education method: Explanation, Demonstration, Tactile cues, Verbal cues, and Handouts Education comprehension: verbalized understanding, returned demonstration, verbal cues required, tactile cues required, and needs further education  HOME EXERCISE PROGRAM:  Access Code: [email or web address] URL: [email or web address] Date: 05/31/2023 Prepared by: Corlis Leak  Exercises - Flexion-Extension Shoulder Pendulum with Table Support  - 3-4 x daily - 7 x weekly - 1 sets - 20-30 reps - Horizontal Shoulder Pendulum with Table Support  - 3-4 x daily - 7 x weekly - 1 sets - 20-30 reps - Circular Shoulder Pendulum with Table Support  - 3-4 x daily - 7 x weekly - 1 sets - 20-30 reps - Standing Shoulder Flexion AAROM with Swiss Ball  - 2 x daily - 7 x weekly - 1 sets - 10 reps - 5-10 sec  hold - Seated Scapular Retraction  - 2 x daily - 7 x weekly - 1-2 sets - 10 reps - 10 sec  hold - Standing Scapular Retraction  - 3 x daily - 7 x weekly - 1 sets - 10 reps - 10 sec  hold - Seated Shoulder Flexion AAROM with Pulley Behind  - 2 x daily - 7 x weekly - 1 sets - 10 reps - 10 sec  hold - Seated  Shoulder Scaption AAROM with Pulley at Side  - 2 x daily - 7 x weekly - 1 sets - 10 reps - 10sec  hold - Seated Shoulder External Rotation AAROM with Cane and Hand in Neutral  - 2 x daily - 7 x weekly - 1 sets - 5-10 reps - 5-10 sec  hold - Shoulder Alphabet with Ball at Counter  - 2 x daily - 7 x weekly - 1 sets - Prone Shoulder Extension - Single Arm  - 1 x daily - 7 x weekly - 1-2 sets - 10 reps - 3  sec  hold - Prone Shoulder Row  - 1 x daily - 7 x weekly - 1-2 sets - 10 reps - 3 sec  hold - Seated Single Arm Bicep Curls Supinated with Dumbbell  - 1 x daily - 7 x weekly - 1-2 sets - 10 reps - 1-2 sec  hold - Supine Elbow Flexion Extension AROM  - 2 x daily - 7 x weekly - 2-3 sets - 10 reps - 3 sec  hold - Standing Shoulder Row Reactive Isometric  - 2 x daily - 7 x weekly - 1 sets - 10 reps - 30-45 sec  hold - Shoulder External Rotation Reactive Isometrics  - 1 x daily - 7 x weekly - 1 sets - 10 reps - 3-5 sec  hold - Shoulder Internal Rotation Reactive Isometrics  - 2 x daily - 7 x weekly - 1 sets - 10 reps - 3-5 sec  hold - Standing Shoulder Flexion to 90 Degrees with Dumbbells  - 1 x daily - 7 x weekly - 1-2 sets - 10 reps - 2 sec  hold - Scaption with Dumbbells  - 1 x daily - 7 x weekly - 1-2 sets - 10 reps - 2 sec  hold - Sidelying Shoulder External Rotation AROM  - 1 x daily - 7 x weekly - 1-2 sets - 10 reps - 2-3 sec  hold - Bicep Stretch at Table  - 2 x daily - 7 x weekly - 1 sets -  3 reps - 30 sec  hold - Standing Shoulder Flexion Stretch on Wall  - 1 x daily - 7 x weekly - 2 sets - 10 reps - 3-5 sec  hold - Supine Shoulder Rhythmic Stabilization- Horizontal Abduction/Adduction  - 1 x daily - 7 x weekly - 1 sets - 5-10 reps - Supine Triceps Extension with Resistance  - 1 x daily - 7 x weekly - 1 sets - 10 reps - 3 sec  hold  ASSESSMENT:  CLINICAL IMPRESSION: Some persistent tightness and discomfort in R biceps. Reviewed and progressed exercises for ROM and stabilization R shoulder.  Continued with manual work and PROM. Progressing well toward stated goals of therapy.   RTD 06/07/23  OBJECTIVE IMPAIRMENTS: decreased activity tolerance, decreased mobility, decreased ROM, decreased strength, hypomobility, increased fascial restrictions, increased muscle spasms, impaired flexibility, impaired UE functional use, improper body mechanics, postural dysfunction, and pain.     GOALS: Goals reviewed with patient? Yes  SHORT TERM GOALS: Target date: 05/09/2023   Independent in initial HEP  Baseline: Goal status: INITIAL  2.  Patient independent in donning and doffing sling  Baseline:  Goal status: INITIAL   LONG TERM GOALS: Target date: 06/20/2023   Rt shoulder ROM WFL's  Baseline:  Goal status: INITIAL  2.  Rt shoulder strength 4+/5 to 5/5 throughout  Baseline:  Goal status: INITIAL  3.  Improve posture and alignment with improved upright posture and posterior shoulder girdle engaged  Baseline:  Goal status: INITIAL  4.  Patient reports decreased pain in Rt shoulder with functional activities to no more than 2/10  Baseline:  Goal status: INITIAL  5.  Independent in HEP including aquatic program as indicated  Baseline:  Goal status: INITIAL  PLAN:  PT FREQUENCY: 2x/week  PT DURATION: 8 weeks  PLANNED INTERVENTIONS: Therapeutic exercises, Therapeutic activity, Neuromuscular re-education, Patient/Family education, Self Care, Joint mobilization, Aquatic Therapy, Dry Needling, Electrical stimulation, Spinal mobilization, Cryotherapy, Moist heat, Taping, Vasopneumatic device, Ultrasound, Ionotophoresis 4mg /ml Dexamethasone, Manual therapy, and Re-evaluation  PLAN FOR NEXT SESSION: review and progress exercise; continue with postural correction and education; manual work,DN, modalities as indicated    [employer], PT 05/31/2023, 11:54 AM

## 2023-06-02 ENCOUNTER — Ambulatory Visit
Admission: EM | Admit: 2023-06-02 | Discharge: 2023-06-02 | Disposition: A | Discharge status: Home / Self Care | Payer: Medicare HMO | Attending: Urgent Care | Admitting: Urgent Care

## 2023-06-02 DIAGNOSIS — Z23 Encounter for immunization: Secondary | ICD-10-CM

## 2023-06-02 DIAGNOSIS — S41112A Laceration without foreign body of left upper arm, initial encounter: Secondary | ICD-10-CM

## 2023-06-02 MED ORDER — AMOXICILLIN-POT CLAVULANATE 875-125 MG PO TABS: 1.0000 | ORAL_TABLET | Freq: Two times a day (BID) | ORAL | 0 refills | Status: DC

## 2023-06-02 MED ORDER — TETANUS-DIPHTH-ACELL PERTUSSIS 5-2.5-18.5 LF-MCG/0.5 IM SUSY
0.5000 mL | PREFILLED_SYRINGE | Freq: Once | INTRAMUSCULAR | Status: AC
Start: 2023-06-02 — End: 2023-06-02
  Administered 2023-06-02: 0.5 mL via INTRAMUSCULAR

## 2023-06-02 NOTE — ED Provider Notes (Signed)
[facility]    CSN: [id number] Arrival date & time: 06/02/23  1454      History   Chief Complaint Chief Complaint  Patient presents with   Laceration    HPI Mary Freeman is a 72 y.o. female.   Pleasant 72 year old female presents today due to concerns of a laceration that occurred to her left dorsal forearm on Monday afternoon. She was leaning on the porch rail when a chipmunk startled her, causing her to cut her arm on the deck.  She tried to treated at home with Steri-Strips and antiseptic solution.  She believes it is infected as it has a slough and is tender to touch.  She reports that range of motion of her arm is unaffected, but surrounding the wound is very tender. There is an odor from the wound. She denies a fever or lymphangitis.  Her last tetanus vaccine was greater than 6 years ago.   Laceration   Past Medical History:  Diagnosis Date   Depression    Headache    migraines   PONV (postoperative nausea and vomiting)     Patient Active Problem List   Diagnosis Date Noted   Anxiety 02/11/2021   Adjustment disorder with depressed mood 02/11/2021   Chronic kidney disease, stage 3 unspecified (HCC) 02/11/2021   Collagenous colitis 02/11/2021   Gastroesophageal reflux disease 02/11/2021   Migraine without aura, not refractory 02/11/2021   Mild depression 02/11/2021   Osteopenia 02/11/2021   Early satiety 01/12/2021   Bloating 01/12/2021   Abdominal pain 01/12/2021    Past Surgical History:  Procedure Laterality Date   APPENDECTOMY     BREAST EXCISIONAL BIOPSY Right    benign   COLONOSCOPY     KNEE ARTHROSCOPY Right    x3   KNEE ARTHROSCOPY W/ MENISCAL REPAIR Left    LUMBAR LAMINECTOMY  1986   REDUCTION MAMMAPLASTY Bilateral    2006   ROTATOR CUFF REPAIR Right 2012   TONSILLECTOMY     TUBAL LIGATION      OB History     Gravida  3   Para  3   Term  3   Preterm      AB      Living         SAB      IAB      Ectopic       Multiple      Live Births  3            Home Medications    Prior to Admission medications   Medication Sig Start Date End Date Taking? Authorizing Provider  amoxicillin-clavulanate (AUGMENTIN) 875-125 MG tablet Take 1 tablet by mouth every 12 (twelve) hours. 06/02/23  Yes ,  L, PA  acetaminophen-codeine (TYLENOL #3) 300-30 MG tablet Take 1-2 tablets by mouth every 6 (six) hours as needed for moderate pain. 02/06/21   Eustace Moore, MD  Budesonide (ENTOCORT EC PO) Take by mouth.    [provider]  Calcium-Magnesium (CAL-MAG PO) Take 1 tablet by mouth daily.    [provider]  cholecalciferol (VITAMIN D) 1000 units tablet Take 1,000 Units by mouth daily.    [provider]  FLUoxetine (PROZAC) 20 MG capsule Take 20 mg by mouth daily. 12/25/22   [provider]  LORazepam (ATIVAN) 0.5 MG tablet Take 0.5 mg by mouth at bedtime as needed for sleep.    [provider]  Multiple Vitamins-Minerals (MULTIVITAMIN WITH MINERALS) tablet  Take 1 tablet by mouth daily.    [provider]  rizatriptan (MAXALT) 10 MG tablet Take 10 mg by mouth as needed for migraine. May repeat in 2 hours if needed    [provider]    Family History Family History  Problem Relation Age of Onset   Cancer Father        lung   Cancer Sister        breast   Breast cancer Sister 54   Alcoholism Sister    Alcoholism Mother    Alcoholism Brother    Cancer Sister    Breast cancer Sister 43   Alcoholism Daughter    Alcoholism Daughter     Social History Social History   Tobacco Use   Smoking status: Never   Smokeless tobacco: Never  Vaping Use   Vaping Use: Never used  Substance Use Topics   Alcohol use: Yes    Comment: very rare    Drug use: No     Allergies   Morphine and codeine, Amitriptyline, Dilaudid [hydromorphone], Diphenhydramine, Hydrocodone-acetaminophen, Pentazocine, Stadol [butorphanol], Sulfa antibiotics,  Benadryl [diphenhydramine hcl], Flagyl [metronidazole], Prednisone, Solu-medrol [methylprednisolone], Butorphanol tartrate, Hydrocodone-acetaminophen, Hydromorphone hcl, Other, Oxycodone-acetaminophen, and Promethazine   Review of Systems Review of Systems As per HPI  Physical Exam Triage Vital Signs ED Triage Vitals  Enc Vitals Group     BP 06/02/23 1516 (!) 93/56     Pulse Rate 06/02/23 1516 87     Resp 06/02/23 1516 16     Temp 06/02/23 1516 98.2 F (36.8 C)     Temp src --      SpO2 06/02/23 1516 98 %     Weight --      Height --      Head Circumference --      Peak Flow --      Pain Score 06/02/23 1514 0     Pain Loc --      Pain Edu? --      Excl. in GC? --    No data found.  Updated Vital Signs BP (!) 93/56   Pulse 87   Temp 98.2 F (36.8 C)   Resp 16   SpO2 98%   Visual Acuity Right Eye Distance:   Left Eye Distance:   Bilateral Distance:    Right Eye Near:   Left Eye Near:    Bilateral Near:     Physical Exam Vitals and nursing note reviewed.  Constitutional:      General: She is not in acute distress.    Appearance: She is well-developed.  HENT:     Head: Normocephalic and atraumatic.  Eyes:     General: No scleral icterus.       Right eye: No discharge.        Left eye: No discharge.  Cardiovascular:     Rate and Rhythm: Normal rate.  Pulmonary:     Effort: Pulmonary effort is normal. No respiratory distress.  Musculoskeletal:        General: Tenderness (to site of injury) and signs of injury (L dorsal forearm with open laceration - 7 steristrips currently in place; minimal drainage, however erythema surrounding wound with foul odor; roughly 4cm in length, 1cm in width) present. No swelling. Normal range of motion.       Arms:     Cervical back: Neck supple.  Skin:    General: Skin is warm and dry.     Capillary Refill: Capillary refill takes  less than 2 seconds.     Findings: Erythema (immediately surrounding wound) present.   Neurological:     General: No focal deficit present.     Mental Status: She is alert and oriented to person, place, and time.     Motor: No weakness.  Psychiatric:        Mood and Affect: Mood normal.      UC Treatments / Results  Labs (all labs ordered are listed, but only abnormal results are displayed) Labs Reviewed - No data to display  EKG   Radiology No results found.  Procedures Wound Care  Date/Time: 06/02/2023 3:05 PM  Performed by: Maretta Bees, PA Authorized by: Maretta Bees, PA   Consent:    Consent obtained:  Verbal   Consent given by:  Patient   Risks, benefits, and alternatives were discussed: yes     Risks discussed:  Bleeding, incomplete drainage, infection, pain, poor cosmetic result and nerve damage   Alternatives discussed:  Alternative treatment and no treatment Universal protocol:    Procedure explained and questions answered to patient or proxy's satisfaction: yes     Patient identity confirmed:  Verbally with patient Sedation:    Sedation type:  None Anesthesia:    Anesthesia method:  None Procedure details:    Wound age (days):  5   Wound surface area (sq cm):  4 Dressing:    Dressing applied:  Xeroform 1x8 and Telfa pad Post-procedure details:    Procedure completion:  Tolerated  (including critical care time)  Medications Ordered in UC Medications  Tdap (BOOSTRIX) injection 0.5 mL (0.5 mLs Intramuscular Given 06/02/23 1542)    Initial Impression / Assessment and Plan / UC Course  I have reviewed the triage vital signs and the nursing notes.  Pertinent labs & imaging results that were available during my care of the patient were reviewed by me and considered in my medical decision making (see chart for details).     Arm laceration on L -appears to be infected.  At this point the wound is 20 days old, therefore cannot close.  I removed the Steri-Strips in office and dressed it with Xeroform and Telfa pad.  Recommended patient  change her Xeroform dressing every 24 hours, but leaving the wound open at night. Will start PO Augmentin BID. RTC precautions discussed. Tdap - out of date, updated in office.   Final Clinical Impressions(s) / UC Diagnoses   Final diagnoses:  Arm laceration with complication, left, initial encounter  Need for prophylactic vaccination with combined diphtheria-tetanus-pertussis (DTP) vaccine     Discharge Instructions      We updated your tetanus vaccination today. Please keep the Xeroform dressing on until bed tonight. Leave the wound open this evening. Place a new Xeroform dressing over the wound tomorrow and leave on during the day.  You will cover this with a Telfa pad. Please order more xeroform and change dressing daily, leaving open at night, until skin is fully closed.  Please start taking the Augmentin and antibiotic twice daily with food until gone. If you continue to have pain, redness, drainage or swelling, return for recheck.     ED Prescriptions     Medication Sig Dispense Auth. Provider   amoxicillin-clavulanate (AUGMENTIN) 875-125 MG tablet Take 1 tablet by mouth every 12 (twelve) hours. 14 tablet ,  L, [place]      PDMP not reviewed this encounter.   Maretta Bees, [place] 06/02/23 2233

## 2023-06-02 NOTE — Discharge Instructions (Addendum)
We updated your tetanus vaccination today. Please keep the Xeroform dressing on until bed tonight. Leave the wound open this evening. Place a new Xeroform dressing over the wound tomorrow and leave on during the day.  You will cover this with a Telfa pad. Please order more xeroform and change dressing daily, leaving open at night, until skin is fully closed.  Please start taking the Augmentin and antibiotic twice daily with food until gone. If you continue to have pain, redness, drainage or swelling, return for recheck.

## 2023-06-02 NOTE — ED Triage Notes (Signed)
Pt presents to uc with co of left arm laceration on Monday from her railing on her porch. 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 Pt reports pain is worse and it is getting difficult to raise arm.

## 2023-06-05 ENCOUNTER — Encounter: Payer: Self-pay | Admitting: Rehabilitative and Restorative Service Providers"

## 2023-06-05 ENCOUNTER — Ambulatory Visit: Payer: Medicare HMO | Admitting: Rehabilitative and Restorative Service Providers"

## 2023-06-05 DIAGNOSIS — M25511 Pain in right shoulder: Secondary | ICD-10-CM | POA: Diagnosis not present

## 2023-06-05 DIAGNOSIS — R293 Abnormal posture: Secondary | ICD-10-CM | POA: Diagnosis not present

## 2023-06-05 DIAGNOSIS — R29898 Other symptoms and signs involving the musculoskeletal system: Secondary | ICD-10-CM | POA: Diagnosis not present

## 2023-06-05 DIAGNOSIS — M6281 Muscle weakness (generalized): Secondary | ICD-10-CM | POA: Diagnosis not present

## 2023-06-05 NOTE — Therapy (Signed)
OUTPATIENT PHYSICAL THERAPY SHOULDER TREATMENT    Patient Name: Mary Freeman MRN: [id number] DOB:February 02, 1951, 72 y.o., female Today's Date: 06/05/2023  END OF SESSION:  PT End of Session - 06/05/23 1404     Visit Number 19    Number of Visits 24    Date for PT Re-Evaluation 06/20/23    Authorization Type [employer] $20 copay    Authorization - Visit Number 19    Progress Note Due on Visit 20    PT Start Time 1402    PT Stop Time 1450    PT Time Calculation (min) 48 min    Activity Tolerance Patient tolerated treatment well              Past Medical History:  Diagnosis Date   Depression    Headache    migraines   PONV (postoperative nausea and vomiting)    Past Surgical History:  Procedure Laterality Date   APPENDECTOMY     BREAST EXCISIONAL BIOPSY Right    benign   COLONOSCOPY     KNEE ARTHROSCOPY Right    x3   KNEE ARTHROSCOPY W/ MENISCAL REPAIR Left    LUMBAR LAMINECTOMY  1986   REDUCTION MAMMAPLASTY Bilateral    2006   ROTATOR CUFF REPAIR Right 2012   TONSILLECTOMY     TUBAL LIGATION     Patient Active Problem List   Diagnosis Date Noted   Anxiety 02/11/2021   Adjustment disorder with depressed mood 02/11/2021   Chronic kidney disease, stage 3 unspecified (HCC) 02/11/2021   Collagenous colitis 02/11/2021   Gastroesophageal reflux disease 02/11/2021   Migraine without aura, not refractory 02/11/2021   Mild depression 02/11/2021   Osteopenia 02/11/2021   Early satiety 01/12/2021   Bloating 01/12/2021   Abdominal pain 01/12/2021    PCP: Dr Sigmund Hazel  REFERRING PROVIDER: Dr Francena Hanly   REFERRING DIAG: Rt shoulder torn RC  THERAPY DIAG:  Acute pain of right shoulder  Other symptoms and signs involving the musculoskeletal system  Abnormal posture  Muscle weakness (generalized)  Rationale for Evaluation and Treatment: Rehabilitation  ONSET DATE: 02/25/23; surgery 03/21/23  SUBJECTIVE:                                                                                                                                                                                       SUBJECTIVE STATEMENT: Mary Freeman reports that her R biceps is still hurting but she can do the pendulum and get rid of the biceps pain. L arm hurts is healing well. Patient reports that she has some pain in the R shoulder and biceps area mostly in the morning when she  wakes up. She has some intermittent pain and discomfort in the biceps area. She treats biceps pain with ice. She is working on her exercises but can't use the pulley because it hurts her elbow.  Hand dominance: Right  PERTINENT HISTORY: Patient reports falling onto Rt shoulder 02/25/23 sustaining torn RC and biceps. She underwent surgery 03/21/23 for [facility] repair and biceps tendon repair.   Lt shoulder scope RC, Lt 05/25/17 DCR/SAD/frayed RC/torn labrum; Rt shoulder pain; cervical pain 2018; LBP 2020; Rt knee pain 2022 - denies any medical problems   PAIN:  Are you having pain? Yes: NPRS scale: 0/10 Pain location: Rt shoulder; elbow; hand  Pain description: tightness; dull; aching; sore - occasionally sharp Aggravating factors: moving  Relieving factors: meds; avoiding movement  PRECAUTIONS: Shoulder per protocol  WEIGHT BEARING RESTRICTIONS: No  FALLS:  Has patient fallen in last 6 months? No  LIVING ENVIRONMENT: Lives with: lives alone Lives in: House/apartment Stairs: Yes: Internal: 12 steps; on right going up and External: 3 steps; on right going up Has following equipment at home: Single point cane  OCCUPATION: Retired oncology RN 2021; active with household chores, gardening, yard work    PLOF: Independent  PATIENT GOALS:get the arm working like normal again   NEXT MD VISIT: 06/05/23  OBJECTIVE:   UPPER EXTREMITY ROM:    ROM Active Right 06/05/23 Passive Right 06/05/23  Shoulder flexion 127 151  Shoulder extension 40 60  Shoulder abduction 123 142  Shoulder adduction    Shoulder  internal rotation Hand to waist   Shoulder external rotation 47 elbow at side  80 in scaption   Elbow flexion    Elbow extension    Wrist flexion    Wrist extension    Wrist ulnar deviation    Wrist radial deviation    Wrist pronation    Wrist supination    (Blank rows = not tested)  UPPER EXTREMITY MMT:  MMT Right eval Left eval  Shoulder flexion    Shoulder extension    Shoulder abduction    Shoulder adduction    Shoulder internal rotation    Shoulder external rotation    Middle trapezius    Lower trapezius    Elbow flexion    Elbow extension    Wrist flexion    Wrist extension    Wrist ulnar deviation    Wrist radial deviation    Wrist pronation    Wrist supination    Grip strength (lbs)    (Blank rows = not tested)  PALPATION:  Muscular tightness Rt shoulder girdle  05/16/23: muscular tightness Rt lateral cervical musculature  06/05/23: muscular tightness Rt shoulder girdle    [facility] Adult PT Treatment:                                                DATE: 06/05/23 Therapeutic Exercise: Biceps stretch standing at table 15 sec x 3  Shoulder flexion hands at wall rolling foam roll up wall 5 sec x 10  Row red TB 3 sec x 10 ER red TB R x 3 sec x 10  IR red TB R x 3 sec x 10  Standing shoulder flexion w/ noodle x 10  Standing scaption shoulder flexion w/ noodle x 10  Sidelying ER AROM x 10  Sidelying abduction AROM x 10  Sidelying flexion AROM x 10  Supine rhythmic stabilization fwd/back; horizontal ab/ad; circles CW/CCW x 15-20  Triceps extension yellow TB x 10  Biceps curl 1# DB x 10  Manual Therapy: (patient supine) PROM in supine within tissue tolerance and protocol guidelines  SMT Rt upper upper quarter  Modalities:  Vaso 34 deg; medium pressure x 10 min Lt shoulder; moist heat to cervical spine   [facility] Adult PT Treatment:     (Dry needling)      DATE: 05/18/23 Therapeutic Exercise: (Reviewed exercises for home program - will hold prone exercises and  continue with other exercises) Pendulum A/P, laterally, CW/CCW all x 20 Pulley flexion 10 sec x 10  Pulley scaption 10 sec x 10  Pulley horizontal ab/ad at ~ 90 deg elevation Isometric row red TB 3 sec x 10 Isometric ER red TB R x 3 sec x 10  Isometric IR red TB R x 3 sec x 10  Manual Therapy: (patient supine) Skilled palpation to assess response to manual work and DN PROM in supine within tissue tolerance and protocol guidelines  SMT Rt upper upper quarter Trigger Point Dry-Needling  Treatment instructions: Expect mild to moderate muscle soreness. S/S of pneumothorax if dry needled over a lung field, and to seek immediate medical attention should they occur. Patient verbalized understanding of these instructions and education.  Patient Consent Given: Yes Education handout provided: Previously provided Muscles treated: R lateral cervical and anterior fibers of upper trap; R extensor forearm  Electrical stimulation performed: No Parameters: N/A Treatment response/outcome: decreased palpable tightness and full resolution of radicular symptoms   Modalities:  Vaso 34 deg; medium pressure x 10 min Lt shoulder; moist heat to cervical spine   Therapeutic Exercise: (Reviewed exercises for home program) Pendulum A/P, laterally, CW/CCW all x 20 Pulley flexion 10 sec x 10  Pulley scaption 10 sec x 10  Pulley horizontal ab/ad at ~ 90 deg elevation Shoulder alphabet ball supported on table  ER with cane standing 10 sec x 10  Scap squeeze supine 10 sec x 10 Backwards shoulder rolls 3-5 sec x 10  Elbow flex/ext x 10 Pronation/supination x 10  Rolling green swiss ball into forward flexion x 10  Prone shoudler extension 3 sec x 10  Prone row 3 sec x 10  Biceps curl 1# x 10  Triceps extension supine 1# x 10  Biceps stretch standing 15 sec x 3 Shoulder flexion hands up wall x 10  Rhythmic stabilization supine multiple directions    PATIENT EDUCATION: Education details: POC; HEP  Person  educated: Patient Education method: Programmer, multimedia, Demonstration, Tactile cues, Verbal cues, and Handouts Education comprehension: verbalized understanding, returned demonstration, verbal cues required, tactile cues required, and needs further education  HOME EXERCISE PROGRAM:  Access Code: [email or web address] URL: [email or web address] Date: 06/05/2023 Prepared by: Corlis Leak  Exercises - Flexion-Extension Shoulder Pendulum with Table Support  - 3-4 x daily - 7 x weekly - 1 sets - 20-30 reps - Horizontal Shoulder Pendulum with Table Support  - 3-4 x daily - 7 x weekly - 1 sets - 20-30 reps - Circular Shoulder Pendulum with Table Support  - 3-4 x daily - 7 x weekly - 1 sets - 20-30 reps - Standing Shoulder Flexion AAROM with Swiss Ball  - 2 x daily - 7 x weekly - 1 sets - 10 reps - 5-10 sec  hold - Seated Scapular Retraction  - 2 x daily - 7 x weekly - 1-2 sets - 10 reps - 10 sec  hold - Standing  Scapular Retraction  - 3 x daily - 7 x weekly - 1 sets - 10 reps - 10 sec  hold - Seated Shoulder Flexion AAROM with Pulley Behind  - 2 x daily - 7 x weekly - 1 sets - 10 reps - 10 sec  hold - Seated Shoulder Scaption AAROM with Pulley at Side  - 2 x daily - 7 x weekly - 1 sets - 10 reps - 10sec  hold - Seated Shoulder External Rotation AAROM with Cane and Hand in Neutral  - 2 x daily - 7 x weekly - 1 sets - 5-10 reps - 5-10 sec  hold - Shoulder Alphabet with Ball at Counter  - 2 x daily - 7 x weekly - 1 sets - Prone Shoulder Extension - Single Arm  - 1 x daily - 7 x weekly - 1-2 sets - 10 reps - 3  sec  hold - Prone Shoulder Row  - 1 x daily - 7 x weekly - 1-2 sets - 10 reps - 3 sec  hold - Seated Single Arm Bicep Curls Supinated with Dumbbell  - 1 x daily - 7 x weekly - 1-2 sets - 10 reps - 1-2 sec  hold - Supine Elbow Flexion Extension AROM  - 2 x daily - 7 x weekly - 2-3 sets - 10 reps - 3 sec  hold - Standing Shoulder Row Reactive Isometric  - 2 x daily - 7 x weekly - 1 sets - 10 reps - 30-45  sec  hold - Shoulder External Rotation Reactive Isometrics  - 1 x daily - 7 x weekly - 1 sets - 10 reps - 3-5 sec  hold - Shoulder Internal Rotation Reactive Isometrics  - 2 x daily - 7 x weekly - 1 sets - 10 reps - 3-5 sec  hold - Standing Shoulder Flexion to 90 Degrees with Dumbbells  - 1 x daily - 7 x weekly - 1-2 sets - 10 reps - 2 sec  hold - Scaption with Dumbbells  - 1 x daily - 7 x weekly - 1-2 sets - 10 reps - 2 sec  hold - Sidelying Shoulder External Rotation AROM  - 1 x daily - 7 x weekly - 1-2 sets - 10 reps - 2-3 sec  hold - Bicep Stretch at Table  - 2 x daily - 7 x weekly - 1 sets - 3 reps - 30 sec  hold - Standing Shoulder Flexion Stretch on Wall  - 1 x daily - 7 x weekly - 2 sets - 10 reps - 3-5 sec  hold - Supine Shoulder Rhythmic Stabilization- Horizontal Abduction/Adduction  - 1 x daily - 7 x weekly - 1 sets - 5-10 reps - Supine Triceps Extension with Resistance  - 1 x daily - 7 x weekly - 1 sets - 10 reps - 3 sec  hold - Standing Bilateral Low Shoulder Row with Anchored Resistance  - 2 x daily - 7 x weekly - 1-3 sets - 10 reps - 2-3 sec  hold - Shoulder External Rotation with Anchored Resistance  - 2 x daily - 7 x weekly - 1-2 sets - 10 reps - 3 sec  hold - Shoulder Internal Rotation with Resistance  - 2 x daily - 7 x weekly - 2 sets - 10 reps - 3 sec  hold  ASSESSMENT:  CLINICAL IMPRESSION: Ulrike is progressing well with R shoulder rehab. She has some persistent tightness and discomfort in R biceps. Reviewed  and progressed exercises for ROM and stabilization R shoulder. Continued with manual work and PROM. Progressing well toward stated goals of therapy.   RTD 06/07/23  OBJECTIVE IMPAIRMENTS: decreased activity tolerance, decreased mobility, decreased ROM, decreased strength, hypomobility, increased fascial restrictions, increased muscle spasms, impaired flexibility, impaired UE functional use, improper body mechanics, postural dysfunction, and pain.     GOALS: Goals  reviewed with patient? Yes  SHORT TERM GOALS: Target date: 05/09/2023   Independent in initial HEP  Baseline: Goal status: INITIAL  2.  Patient independent in donning and doffing sling  Baseline:  Goal status: INITIAL   LONG TERM GOALS: Target date: 06/20/2023   Rt shoulder ROM WFL's  Baseline:  Goal status: INITIAL  2.  Rt shoulder strength 4+/5 to 5/5 throughout  Baseline:  Goal status: INITIAL  3.  Improve posture and alignment with improved upright posture and posterior shoulder girdle engaged  Baseline:  Goal status: INITIAL  4.  Patient reports decreased pain in Rt shoulder with functional activities to no more than 2/10  Baseline:  Goal status: INITIAL  5.  Independent in HEP including aquatic program as indicated  Baseline:  Goal status: INITIAL  PLAN:  PT FREQUENCY: 2x/week  PT DURATION: 8 weeks  PLANNED INTERVENTIONS: Therapeutic exercises, Therapeutic activity, Neuromuscular re-education, Patient/Family education, Self Care, Joint mobilization, Aquatic Therapy, Dry Needling, Electrical stimulation, Spinal mobilization, Cryotherapy, Moist heat, Taping, Vasopneumatic device, Ultrasound, Ionotophoresis 4mg /ml Dexamethasone, Manual therapy, and Re-evaluation  PLAN FOR NEXT SESSION: review and progress exercise; continue with postural correction and education; manual work,DN, modalities as indicated     Rober Minion, PT 06/05/2023, 2:04 PM

## 2023-06-08 ENCOUNTER — Encounter: Payer: Self-pay | Admitting: Rehabilitative and Restorative Service Providers"

## 2023-06-08 ENCOUNTER — Ambulatory Visit: Payer: Medicare HMO | Admitting: Rehabilitative and Restorative Service Providers"

## 2023-06-08 DIAGNOSIS — M6281 Muscle weakness (generalized): Secondary | ICD-10-CM

## 2023-06-08 DIAGNOSIS — R293 Abnormal posture: Secondary | ICD-10-CM

## 2023-06-08 DIAGNOSIS — M25511 Pain in right shoulder: Secondary | ICD-10-CM

## 2023-06-08 DIAGNOSIS — R29898 Other symptoms and signs involving the musculoskeletal system: Secondary | ICD-10-CM

## 2023-06-08 NOTE — Therapy (Signed)
OUTPATIENT PHYSICAL THERAPY SHOULDER TREATMENT    Patient Name: Mary Freeman MRN: [id number] DOB:05-12-1951, 72 y.o., female Today's Date: 06/08/2023  END OF SESSION:  PT End of Session - 06/08/23 1446     Visit Number 20    Number of Visits 24    Date for PT Re-Evaluation 06/20/23    Authorization Type [employer] $20 copay    Authorization - Visit Number 20    Progress Note Due on Visit 20    PT Start Time 1444    PT Stop Time 1532    PT Time Calculation (min) 48 min    Activity Tolerance Patient tolerated treatment well              Past Medical History:  Diagnosis Date   Depression    Headache    migraines   PONV (postoperative nausea and vomiting)    Past Surgical History:  Procedure Laterality Date   APPENDECTOMY     BREAST EXCISIONAL BIOPSY Right    benign   COLONOSCOPY     KNEE ARTHROSCOPY Right    x3   KNEE ARTHROSCOPY W/ MENISCAL REPAIR Left    LUMBAR LAMINECTOMY  1986   REDUCTION MAMMAPLASTY Bilateral    2006   ROTATOR CUFF REPAIR Right 2012   TONSILLECTOMY     TUBAL LIGATION     Patient Active Problem List   Diagnosis Date Noted   Anxiety 02/11/2021   Adjustment disorder with depressed mood 02/11/2021   Chronic kidney disease, stage 3 unspecified (HCC) 02/11/2021   Collagenous colitis 02/11/2021   Gastroesophageal reflux disease 02/11/2021   Migraine without aura, not refractory 02/11/2021   Mild depression 02/11/2021   Osteopenia 02/11/2021   Early satiety 01/12/2021   Bloating 01/12/2021   Abdominal pain 01/12/2021    PCP: Dr Sigmund Hazel  REFERRING PROVIDER: Dr Francena Hanly   REFERRING DIAG: Rt shoulder torn RC  THERAPY DIAG:  Acute pain of right shoulder  Other symptoms and signs involving the musculoskeletal system  Abnormal posture  Muscle weakness (generalized)  Rationale for Evaluation and Treatment: Rehabilitation  ONSET DATE: 02/25/23; surgery 03/21/23  SUBJECTIVE:                                                                                                                                                                                       SUBJECTIVE STATEMENT: Patient was seen by MD yesterday and he is pleased with progress. He wants her to continue with PT for 4 more weeks. Stephanee reports that her R biceps is still hurting but she can do the pendulum and get rid of the biceps pain with pain coming  and going. She has some intermittent pain and discomfort in the biceps area. She is working on her exercises but can't use the pulley because it hurts her elbow.  Hand dominance: Right  PERTINENT HISTORY: Patient reports falling onto Rt shoulder 02/25/23 sustaining torn RC and biceps. She underwent surgery 03/21/23 for [facility] repair and biceps tendon repair.   Lt shoulder scope RC, Lt 05/25/17 DCR/SAD/frayed RC/torn labrum; Rt shoulder pain; cervical pain 2018; LBP 2020; Rt knee pain 2022 - denies any medical problems   PAIN:  Are you having pain? Yes: NPRS scale: 0/10 Pain location: Rt shoulder; elbow; hand  Pain description: tightness; dull; aching; sore - occasionally sharp Aggravating factors: moving  Relieving factors: meds; avoiding movement  PRECAUTIONS: Shoulder per protocol  WEIGHT BEARING RESTRICTIONS: No  FALLS:  Has patient fallen in last 6 months? No  LIVING ENVIRONMENT: Lives with: lives alone Lives in: House/apartment Stairs: Yes: Internal: 12 steps; on right going up and External: 3 steps; on right going up Has following equipment at home: Single point cane  OCCUPATION: Retired oncology RN 2021; active with household chores, gardening, yard work    PATIENT GOALS:get the arm working like normal again   NEXT MD VISIT: 07/10/23  OBJECTIVE:   UPPER EXTREMITY ROM:    ROM Active Right 06/05/23 Passive Right 06/05/23  Shoulder flexion 127 151  Shoulder extension 40 60  Shoulder abduction 123 142  Shoulder adduction    Shoulder internal rotation Hand to waist   Shoulder external  rotation 47 elbow at side  80 in scaption   Elbow flexion    Elbow extension    Wrist flexion    Wrist extension    Wrist ulnar deviation    Wrist radial deviation    Wrist pronation    Wrist supination    (Blank rows = not tested)  UPPER EXTREMITY MMT:  MMT Right eval Left eval  Shoulder flexion    Shoulder extension    Shoulder abduction    Shoulder adduction    Shoulder internal rotation    Shoulder external rotation    Middle trapezius    Lower trapezius    Elbow flexion    Elbow extension    Wrist flexion    Wrist extension    Wrist ulnar deviation    Wrist radial deviation    Wrist pronation    Wrist supination    Grip strength (lbs)    (Blank rows = not tested)  PALPATION:  Muscular tightness Rt shoulder girdle  05/16/23: muscular tightness Rt lateral cervical musculature  06/05/23: muscular tightness Rt shoulder girdle    [facility] Adult PT Treatment:                                                DATE: 06/08/23 Therapeutic Exercise: Biceps stretch standing at table 15 sec x 3  Shoulder flexion hands at wall rolling foam roll up wall 5 sec x 10  Row green TB 3 sec x 10 ER green TB R x 3 sec x 10  IR green TB R x 3 sec x 10  Standing shoulder flexion w/ noodle 2# DB x 10  Standing scaption shoulder flexion w/ noodle 2# DB x 10  Wall push up x 10  Sidelying ER AROM 2# DB x 10  Sidelying abduction AROM x 10  Sidelying flexion AROM  x 10  Supine rhythmic stabilization fwd/back; horizontal ab/ad; circles CW/CCW x 15-20  Triceps extension yellow TB x 10  Biceps curl 1# DB x 10  Manual Therapy: (patient supine) PROM in supine within tissue tolerance and protocol guidelines  SMT Rt upper upper quarter  Modalities:  Vaso 34 deg; medium pressure x 10 min Lt shoulder; moist heat to cervical spine   [facility] Adult PT Treatment:                                                DATE: 06/05/23 Therapeutic Exercise: Biceps stretch standing at table 15 sec x 3  Shoulder  flexion hands at wall rolling foam roll up wall 5 sec x 10  Row red TB 3 sec x 10 ER red TB R x 3 sec x 10  IR red TB R x 3 sec x 10  Standing shoulder flexion w/ noodle x 10  Standing scaption shoulder flexion w/ noodle x 10  Sidelying ER AROM x 10  Sidelying abduction AROM x 10  Sidelying flexion AROM x 10  Supine rhythmic stabilization fwd/back; horizontal ab/ad; circles CW/CCW x 15-20  Triceps extension yellow TB x 10  Biceps curl 1# DB x 10  Manual Therapy: (patient supine) PROM in supine within tissue tolerance and protocol guidelines  SMT Rt upper upper quarter  Modalities:  Vaso 34 deg; medium pressure x 10 min Lt shoulder; moist heat to cervical spine   [facility] Adult PT Treatment:     (Dry needling)      DATE: 05/18/23 Therapeutic Exercise: (Reviewed exercises for home program - will hold prone exercises and continue with other exercises) Pendulum A/P, laterally, CW/CCW all x 20 Pulley flexion 10 sec x 10  Pulley scaption 10 sec x 10  Pulley horizontal ab/ad at ~ 90 deg elevation Isometric row red TB 3 sec x 10 Isometric ER red TB R x 3 sec x 10  Isometric IR red TB R x 3 sec x 10  Manual Therapy: (patient supine) Skilled palpation to assess response to manual work and DN PROM in supine within tissue tolerance and protocol guidelines  SMT Rt upper upper quarter Trigger Point Dry-Needling  Treatment instructions: Expect mild to moderate muscle soreness. S/S of pneumothorax if dry needled over a lung field, and to seek immediate medical attention should they occur. Patient verbalized understanding of these instructions and education.  Patient Consent Given: Yes Education handout provided: Previously provided Muscles treated: R lateral cervical and anterior fibers of upper trap; R extensor forearm  Electrical stimulation performed: No Parameters: N/A Treatment response/outcome: decreased palpable tightness and full resolution of radicular symptoms   Modalities:  Vaso 34  deg; medium pressure x 10 min Lt shoulder; moist heat to cervical spine   Therapeutic Exercise: (Reviewed exercises for home program) Pendulum A/P, laterally, CW/CCW all x 20 Pulley flexion 10 sec x 10  Pulley scaption 10 sec x 10  Pulley horizontal ab/ad at ~ 90 deg elevation Shoulder alphabet ball supported on table  ER with cane standing 10 sec x 10  Scap squeeze supine 10 sec x 10 Backwards shoulder rolls 3-5 sec x 10  Elbow flex/ext x 10 Pronation/supination x 10  Rolling green swiss ball into forward flexion x 10  Prone shoudler extension 3 sec x 10  Prone row 3 sec x 10  Biceps  curl 1# x 10  Triceps extension supine 1# x 10  Biceps stretch standing 15 sec x 3 Shoulder flexion hands up wall x 10  Rhythmic stabilization supine multiple directions    PATIENT EDUCATION: Education details: POC; HEP  Person educated: Patient Education method: Programmer, multimedia, Demonstration, Tactile cues, Verbal cues, and Handouts Education comprehension: verbalized understanding, returned demonstration, verbal cues required, tactile cues required, and needs further education  HOME EXERCISE PROGRAM:  Access Code: [email or web address] URL: [email or web address] Date: 06/05/2023 Prepared by: Corlis Leak  Exercises - Flexion-Extension Shoulder Pendulum with Table Support  - 3-4 x daily - 7 x weekly - 1 sets - 20-30 reps - Horizontal Shoulder Pendulum with Table Support  - 3-4 x daily - 7 x weekly - 1 sets - 20-30 reps - Circular Shoulder Pendulum with Table Support  - 3-4 x daily - 7 x weekly - 1 sets - 20-30 reps - Standing Shoulder Flexion AAROM with Swiss Ball  - 2 x daily - 7 x weekly - 1 sets - 10 reps - 5-10 sec  hold - Seated Scapular Retraction  - 2 x daily - 7 x weekly - 1-2 sets - 10 reps - 10 sec  hold - Standing Scapular Retraction  - 3 x daily - 7 x weekly - 1 sets - 10 reps - 10 sec  hold - Seated Shoulder Flexion AAROM with Pulley Behind  - 2 x daily - 7 x weekly - 1 sets - 10  reps - 10 sec  hold - Seated Shoulder Scaption AAROM with Pulley at Side  - 2 x daily - 7 x weekly - 1 sets - 10 reps - 10sec  hold - Seated Shoulder External Rotation AAROM with Cane and Hand in Neutral  - 2 x daily - 7 x weekly - 1 sets - 5-10 reps - 5-10 sec  hold - Shoulder Alphabet with Ball at Counter  - 2 x daily - 7 x weekly - 1 sets - Prone Shoulder Extension - Single Arm  - 1 x daily - 7 x weekly - 1-2 sets - 10 reps - 3  sec  hold - Prone Shoulder Row  - 1 x daily - 7 x weekly - 1-2 sets - 10 reps - 3 sec  hold - Seated Single Arm Bicep Curls Supinated with Dumbbell  - 1 x daily - 7 x weekly - 1-2 sets - 10 reps - 1-2 sec  hold - Supine Elbow Flexion Extension AROM  - 2 x daily - 7 x weekly - 2-3 sets - 10 reps - 3 sec  hold - Standing Shoulder Row Reactive Isometric  - 2 x daily - 7 x weekly - 1 sets - 10 reps - 30-45 sec  hold - Shoulder External Rotation Reactive Isometrics  - 1 x daily - 7 x weekly - 1 sets - 10 reps - 3-5 sec  hold - Shoulder Internal Rotation Reactive Isometrics  - 2 x daily - 7 x weekly - 1 sets - 10 reps - 3-5 sec  hold - Standing Shoulder Flexion to 90 Degrees with Dumbbells  - 1 x daily - 7 x weekly - 1-2 sets - 10 reps - 2 sec  hold - Scaption with Dumbbells  - 1 x daily - 7 x weekly - 1-2 sets - 10 reps - 2 sec  hold - Sidelying Shoulder External Rotation AROM  - 1 x daily - 7 x weekly - 1-2 sets - 10 reps - 2-3  sec  hold - Bicep Stretch at Table  - 2 x daily - 7 x weekly - 1 sets - 3 reps - 30 sec  hold - Standing Shoulder Flexion Stretch on Wall  - 1 x daily - 7 x weekly - 2 sets - 10 reps - 3-5 sec  hold - Supine Shoulder Rhythmic Stabilization- Horizontal Abduction/Adduction  - 1 x daily - 7 x weekly - 1 sets - 5-10 reps - Supine Triceps Extension with Resistance  - 1 x daily - 7 x weekly - 1 sets - 10 reps - 3 sec  hold - Standing Bilateral Low Shoulder Row with Anchored Resistance  - 2 x daily - 7 x weekly - 1-3 sets - 10 reps - 2-3 sec  hold -  Shoulder External Rotation with Anchored Resistance  - 2 x daily - 7 x weekly - 1-2 sets - 10 reps - 3 sec  hold - Shoulder Internal Rotation with Resistance  - 2 x daily - 7 x weekly - 2 sets - 10 reps - 3 sec  hold  ASSESSMENT:  CLINICAL IMPRESSION: Jessa is progressing well with R shoulder rehab. She has some persistent tightness and discomfort in R biceps. Reviewed and progressed exercises for ROM and stabilization R shoulder. Continued with manual work and PROM. Progressing well toward stated goals of therapy.   RTD 06/07/23  OBJECTIVE IMPAIRMENTS: decreased activity tolerance, decreased mobility, decreased ROM, decreased strength, hypomobility, increased fascial restrictions, increased muscle spasms, impaired flexibility, impaired UE functional use, improper body mechanics, postural dysfunction, and pain.     GOALS: Goals reviewed with patient? Yes  SHORT TERM GOALS: Target date: 05/09/2023   Independent in initial HEP  Baseline: Goal status: INITIAL  2.  Patient independent in donning and doffing sling  Baseline:  Goal status: INITIAL   LONG TERM GOALS: Target date: 06/20/2023   Rt shoulder ROM WFL's  Baseline:  Goal status: INITIAL  2.  Rt shoulder strength 4+/5 to 5/5 throughout  Baseline:  Goal status: INITIAL  3.  Improve posture and alignment with improved upright posture and posterior shoulder girdle engaged  Baseline:  Goal status: INITIAL  4.  Patient reports decreased pain in Rt shoulder with functional activities to no more than 2/10  Baseline:  Goal status: INITIAL  5.  Independent in HEP including aquatic program as indicated  Baseline:  Goal status: INITIAL  PLAN:  PT FREQUENCY: 2x/week  PT DURATION: 8 weeks  PLANNED INTERVENTIONS: Therapeutic exercises, Therapeutic activity, Neuromuscular re-education, Patient/Family education, Self Care, Joint mobilization, Aquatic Therapy, Dry Needling, Electrical stimulation, Spinal mobilization,  Cryotherapy, Moist heat, Taping, Vasopneumatic device, Ultrasound, Ionotophoresis 4mg /ml Dexamethasone, Manual therapy, and Re-evaluation  PLAN FOR NEXT SESSION: review and progress exercise; continue with postural correction and education; manual work,DN, modalities as indicated    Val Riles, PT 06/08/2023, 2:48 PM

## 2023-06-12 ENCOUNTER — Ambulatory Visit: Payer: Medicare HMO | Admitting: Rehabilitative and Restorative Service Providers"

## 2023-06-12 ENCOUNTER — Encounter: Payer: Self-pay | Admitting: Rehabilitative and Restorative Service Providers"

## 2023-06-12 DIAGNOSIS — R29898 Other symptoms and signs involving the musculoskeletal system: Secondary | ICD-10-CM | POA: Diagnosis not present

## 2023-06-12 DIAGNOSIS — R293 Abnormal posture: Secondary | ICD-10-CM | POA: Diagnosis not present

## 2023-06-12 DIAGNOSIS — M6281 Muscle weakness (generalized): Secondary | ICD-10-CM

## 2023-06-12 DIAGNOSIS — M25511 Pain in right shoulder: Secondary | ICD-10-CM

## 2023-06-12 NOTE — Therapy (Signed)
OUTPATIENT PHYSICAL THERAPY SHOULDER TREATMENT    Patient Name: Mary Freeman MRN: [id number] DOB:05/04/1951, 72 y.o., female Today's Date: 06/12/2023  END OF SESSION:  PT End of Session - 06/12/23 1448     Visit Number 21    Number of Visits 24    Date for PT Re-Evaluation 06/20/23    Authorization Type [employer] $20 copay    Authorization - Visit Number 30    Progress Note Due on Visit 30    PT Start Time 1445    PT Stop Time 1533    PT Time Calculation (min) 48 min    Activity Tolerance Patient tolerated treatment well              Past Medical History:  Diagnosis Date   Depression    Headache    migraines   PONV (postoperative nausea and vomiting)    Past Surgical History:  Procedure Laterality Date   APPENDECTOMY     BREAST EXCISIONAL BIOPSY Right    benign   COLONOSCOPY     KNEE ARTHROSCOPY Right    x3   KNEE ARTHROSCOPY W/ MENISCAL REPAIR Left    LUMBAR LAMINECTOMY  1986   REDUCTION MAMMAPLASTY Bilateral    2006   ROTATOR CUFF REPAIR Right 2012   TONSILLECTOMY     TUBAL LIGATION     Patient Active Problem List   Diagnosis Date Noted   Anxiety 02/11/2021   Adjustment disorder with depressed mood 02/11/2021   Chronic kidney disease, stage 3 unspecified (HCC) 02/11/2021   Collagenous colitis 02/11/2021   Gastroesophageal reflux disease 02/11/2021   Migraine without aura, not refractory 02/11/2021   Mild depression 02/11/2021   Osteopenia 02/11/2021   Early satiety 01/12/2021   Bloating 01/12/2021   Abdominal pain 01/12/2021    PCP: Dr Sigmund Hazel  REFERRING PROVIDER: Dr Francena Hanly   REFERRING DIAG: Rt shoulder torn RC  THERAPY DIAG:  Acute pain of right shoulder  Other symptoms and signs involving the musculoskeletal system  Abnormal posture  Muscle weakness (generalized)  Rationale for Evaluation and Treatment: Rehabilitation  ONSET DATE: 02/25/23; surgery 03/21/23  SUBJECTIVE:                                                                                                                                                                                       SUBJECTIVE STATEMENT: Patient reports that she has some discomfort in the R shoulder but no pain to speak of. Laryah reports that her R biceps is still hurting but she can do the pendulum and get rid of the biceps pain with pain coming and going. She has some intermittent  pain and discomfort in the biceps area.   Hand dominance: Right  PERTINENT HISTORY: Patient reports falling onto Rt shoulder 02/25/23 sustaining torn RC and biceps. She underwent surgery 03/21/23 for [facility] repair and biceps tendon repair.   Lt shoulder scope RC, Lt 05/25/17 DCR/SAD/frayed RC/torn labrum; Rt shoulder pain; cervical pain 2018; LBP 2020; Rt knee pain 2022 - denies any medical problems   PAIN:  Are you having pain? Yes: NPRS scale: 0/10 Pain location: Rt shoulder; elbow; hand  Pain description: tightness; dull; aching; sore - occasionally sharp Aggravating factors: moving  Relieving factors: meds; avoiding movement  PRECAUTIONS: Shoulder per protocol  WEIGHT BEARING RESTRICTIONS: No  FALLS:  Has patient fallen in last 6 months? No  LIVING ENVIRONMENT: Lives with: lives alone Lives in: House/apartment Stairs: Yes: Internal: 12 steps; on right going up and External: 3 steps; on right going up Has following equipment at home: Single point cane  OCCUPATION: Retired oncology RN 2021; active with household chores, gardening, yard work    PATIENT GOALS:get the arm working like normal again   NEXT MD VISIT: 07/10/23  OBJECTIVE:   UPPER EXTREMITY ROM:    ROM Active Right 06/05/23 Passive Right 06/05/23  Shoulder flexion 127 151  Shoulder extension 40 60  Shoulder abduction 123 142  Shoulder adduction    Shoulder internal rotation Hand to waist   Shoulder external rotation 47 elbow at side  80 in scaption   Elbow flexion    Elbow extension    Wrist flexion    Wrist  extension    Wrist ulnar deviation    Wrist radial deviation    Wrist pronation    Wrist supination    (Blank rows = not tested)  UPPER EXTREMITY MMT:  MMT Right eval Left eval  Shoulder flexion    Shoulder extension    Shoulder abduction    Shoulder adduction    Shoulder internal rotation    Shoulder external rotation    Middle trapezius    Lower trapezius    Elbow flexion    Elbow extension    Wrist flexion    Wrist extension    Wrist ulnar deviation    Wrist radial deviation    Wrist pronation    Wrist supination    Grip strength (lbs)    (Blank rows = not tested)  PALPATION:  Muscular tightness Rt shoulder girdle  05/16/23: muscular tightness Rt lateral cervical musculature  06/05/23: muscular tightness Rt shoulder girdle  [facility] Adult PT Treatment:                                                DATE: 06/12/23 Therapeutic Exercise: UBE L2 x 4 min - alternating fwd/back Doorway stretch 3 positions 30 sec x 1  Biceps stretch standing at table 15 sec x 3  Shoulder flexion hands at wall rolling foam roll up wall 5 sec x 10  Row green TB 3 sec x 10 Shoulder extension green TB 3 sec x 10  ER green TB R x 3 sec x 10  IR green TB R x 3 sec x 10  Standing shoulder flexion w/ noodle 2# DB 3 sec x 10  Standing scaption shoulder flexion w/ noodle 2# DB 3 sec x 10  Biceps curl 2# DB x 10  Wall push up x 10  Sidelying ER AROM  3# DB x 10  Sidelying abduction AROM x 10  Sidelying flexion AROM x 10  Supine rhythmic stabilization fwd/back; horizontal ab/ad; circles CW/CCW x 15-20  Triceps extension yellow TB x 10   Manual Therapy: (patient supine) SMT Rt upper upper quarter PROM in supine within tissue tolerance and protocol guidelines   Modalities:  Vaso 34 deg; medium pressure x 10 min Lt shoulder; moist heat to cervical spine  [facility] Adult PT Treatment:                                                DATE: 06/08/23 Therapeutic Exercise: Biceps stretch standing at table 15  sec x 3  Shoulder flexion hands at wall rolling foam roll up wall 5 sec x 10  Row green TB 3 sec x 10 ER green TB R x 3 sec x 10  IR green TB R x 3 sec x 10  Standing shoulder flexion w/ noodle 2# DB x 10  Standing scaption shoulder flexion w/ noodle 2# DB x 10  Wall push up x 10  Sidelying ER AROM 2# DB x 10  Sidelying abduction AROM x 10  Sidelying flexion AROM x 10  Supine rhythmic stabilization fwd/back; horizontal ab/ad; circles CW/CCW x 15-20  Triceps extension yellow TB x 10  Biceps curl 1# DB x 10  Manual Therapy: (patient supine) PROM in supine within tissue tolerance and protocol guidelines  SMT Rt upper upper quarter  Modalities:  Vaso 34 deg; medium pressure x 10 min Lt shoulder; moist heat to cervical spine   [facility] Adult PT Treatment:     (Dry needling)      DATE: 05/18/23 Therapeutic Exercise: (Reviewed exercises for home program - will hold prone exercises and continue with other exercises) Pendulum A/P, laterally, CW/CCW all x 20 Pulley flexion 10 sec x 10  Pulley scaption 10 sec x 10  Pulley horizontal ab/ad at ~ 90 deg elevation Isometric row red TB 3 sec x 10 Isometric ER red TB R x 3 sec x 10  Isometric IR red TB R x 3 sec x 10  Manual Therapy: (patient supine) Skilled palpation to assess response to manual work and DN PROM in supine within tissue tolerance and protocol guidelines  SMT Rt upper upper quarter Trigger Point Dry-Needling  Treatment instructions: Expect mild to moderate muscle soreness. S/S of pneumothorax if dry needled over a lung field, and to seek immediate medical attention should they occur. Patient verbalized understanding of these instructions and education.  Patient Consent Given: Yes Education handout provided: Previously provided Muscles treated: R lateral cervical and anterior fibers of upper trap; R extensor forearm  Electrical stimulation performed: No Parameters: N/A Treatment response/outcome: decreased palpable tightness and  full resolution of radicular symptoms   Modalities:  Vaso 34 deg; medium pressure x 10 min Lt shoulder; moist heat to cervical spine   Therapeutic Exercise: (Reviewed exercises for home program) Pendulum A/P, laterally, CW/CCW all x 20 Pulley flexion 10 sec x 10  Pulley scaption 10 sec x 10  Pulley horizontal ab/ad at ~ 90 deg elevation Shoulder alphabet ball supported on table  ER with cane standing 10 sec x 10  Scap squeeze supine 10 sec x 10 Backwards shoulder rolls 3-5 sec x 10  Elbow flex/ext x 10 Pronation/supination x 10  Rolling green swiss ball into forward flexion  x 10  Prone shoudler extension 3 sec x 10  Prone row 3 sec x 10  Biceps curl 1# x 10  Triceps extension supine 1# x 10  Biceps stretch standing 15 sec x 3 Shoulder flexion hands up wall x 10  Rhythmic stabilization supine multiple directions    PATIENT EDUCATION: Education details: POC; HEP  Person educated: Patient Education method: Programmer, multimedia, Demonstration, Tactile cues, Verbal cues, and Handouts Education comprehension: verbalized understanding, returned demonstration, verbal cues required, tactile cues required, and needs further education  HOME EXERCISE PROGRAM:  Access Code: [email or web address] URL: [email or web address] Date: 06/05/2023 Prepared by: Corlis Leak  Exercises - Flexion-Extension Shoulder Pendulum with Table Support  - 3-4 x daily - 7 x weekly - 1 sets - 20-30 reps - Horizontal Shoulder Pendulum with Table Support  - 3-4 x daily - 7 x weekly - 1 sets - 20-30 reps - Circular Shoulder Pendulum with Table Support  - 3-4 x daily - 7 x weekly - 1 sets - 20-30 reps - Standing Shoulder Flexion AAROM with Swiss Ball  - 2 x daily - 7 x weekly - 1 sets - 10 reps - 5-10 sec  hold - Seated Scapular Retraction  - 2 x daily - 7 x weekly - 1-2 sets - 10 reps - 10 sec  hold - Standing Scapular Retraction  - 3 x daily - 7 x weekly - 1 sets - 10 reps - 10 sec  hold - Seated Shoulder Flexion  AAROM with Pulley Behind  - 2 x daily - 7 x weekly - 1 sets - 10 reps - 10 sec  hold - Seated Shoulder Scaption AAROM with Pulley at Side  - 2 x daily - 7 x weekly - 1 sets - 10 reps - 10sec  hold - Seated Shoulder External Rotation AAROM with Cane and Hand in Neutral  - 2 x daily - 7 x weekly - 1 sets - 5-10 reps - 5-10 sec  hold - Shoulder Alphabet with Ball at Counter  - 2 x daily - 7 x weekly - 1 sets - Prone Shoulder Extension - Single Arm  - 1 x daily - 7 x weekly - 1-2 sets - 10 reps - 3  sec  hold - Prone Shoulder Row  - 1 x daily - 7 x weekly - 1-2 sets - 10 reps - 3 sec  hold - Seated Single Arm Bicep Curls Supinated with Dumbbell  - 1 x daily - 7 x weekly - 1-2 sets - 10 reps - 1-2 sec  hold - Supine Elbow Flexion Extension AROM  - 2 x daily - 7 x weekly - 2-3 sets - 10 reps - 3 sec  hold - Standing Shoulder Row Reactive Isometric  - 2 x daily - 7 x weekly - 1 sets - 10 reps - 30-45 sec  hold - Shoulder External Rotation Reactive Isometrics  - 1 x daily - 7 x weekly - 1 sets - 10 reps - 3-5 sec  hold - Shoulder Internal Rotation Reactive Isometrics  - 2 x daily - 7 x weekly - 1 sets - 10 reps - 3-5 sec  hold - Standing Shoulder Flexion to 90 Degrees with Dumbbells  - 1 x daily - 7 x weekly - 1-2 sets - 10 reps - 2 sec  hold - Scaption with Dumbbells  - 1 x daily - 7 x weekly - 1-2 sets - 10 reps - 2 sec  hold - Sidelying Shoulder External  Rotation AROM  - 1 x daily - 7 x weekly - 1-2 sets - 10 reps - 2-3 sec  hold - Bicep Stretch at Table  - 2 x daily - 7 x weekly - 1 sets - 3 reps - 30 sec  hold - Standing Shoulder Flexion Stretch on Wall  - 1 x daily - 7 x weekly - 2 sets - 10 reps - 3-5 sec  hold - Supine Shoulder Rhythmic Stabilization- Horizontal Abduction/Adduction  - 1 x daily - 7 x weekly - 1 sets - 5-10 reps - Supine Triceps Extension with Resistance  - 1 x daily - 7 x weekly - 1 sets - 10 reps - 3 sec  hold - Standing Bilateral Low Shoulder Row with Anchored Resistance  - 2 x  daily - 7 x weekly - 1-3 sets - 10 reps - 2-3 sec  hold - Shoulder External Rotation with Anchored Resistance  - 2 x daily - 7 x weekly - 1-2 sets - 10 reps - 3 sec  hold - Shoulder Internal Rotation with Resistance  - 2 x daily - 7 x weekly - 2 sets - 10 reps - 3 sec  hold  ASSESSMENT:  CLINICAL IMPRESSION: Beckie continues to progress well with R shoulder rehab. She has some persistent tightness and discomfort in R biceps. Reviewed and progressed exercises for ROM and stabilization R shoulder. Continued with strengthening, manual work and PROM.    RTD 07/10/23  OBJECTIVE IMPAIRMENTS: decreased activity tolerance, decreased mobility, decreased ROM, decreased strength, hypomobility, increased fascial restrictions, increased muscle spasms, impaired flexibility, impaired UE functional use, improper body mechanics, postural dysfunction, and pain.     GOALS: Goals reviewed with patient? Yes  SHORT TERM GOALS: Target date: 05/09/2023   Independent in initial HEP  Baseline: Goal status: INITIAL  2.  Patient independent in donning and doffing sling  Baseline:  Goal status: INITIAL   LONG TERM GOALS: Target date: 06/20/2023   Rt shoulder ROM WFL's  Baseline:  Goal status: INITIAL  2.  Rt shoulder strength 4+/5 to 5/5 throughout  Baseline:  Goal status: INITIAL  3.  Improve posture and alignment with improved upright posture and posterior shoulder girdle engaged  Baseline:  Goal status: INITIAL  4.  Patient reports decreased pain in Rt shoulder with functional activities to no more than 2/10  Baseline:  Goal status: INITIAL  5.  Independent in HEP including aquatic program as indicated  Baseline:  Goal status: INITIAL  PLAN:  PT FREQUENCY: 2x/week  PT DURATION: 8 weeks  PLANNED INTERVENTIONS: Therapeutic exercises, Therapeutic activity, Neuromuscular re-education, Patient/Family education, Self Care, Joint mobilization, Aquatic Therapy, Dry Needling, Electrical  stimulation, Spinal mobilization, Cryotherapy, Moist heat, Taping, Vasopneumatic device, Ultrasound, Ionotophoresis 4mg /ml Dexamethasone, Manual therapy, and Re-evaluation  PLAN FOR NEXT SESSION: review and progress exercise; continue with postural correction and education; manual work,DN, modalities as indicated    Val Riles, PT 06/12/2023, 2:49 PM

## 2023-06-15 ENCOUNTER — Ambulatory Visit: Payer: Medicare HMO | Admitting: Rehabilitative and Restorative Service Providers"

## 2023-06-15 ENCOUNTER — Encounter: Payer: Self-pay | Admitting: Rehabilitative and Restorative Service Providers"

## 2023-06-15 DIAGNOSIS — M25511 Pain in right shoulder: Secondary | ICD-10-CM | POA: Diagnosis not present

## 2023-06-15 DIAGNOSIS — R293 Abnormal posture: Secondary | ICD-10-CM | POA: Diagnosis not present

## 2023-06-15 DIAGNOSIS — R29898 Other symptoms and signs involving the musculoskeletal system: Secondary | ICD-10-CM | POA: Diagnosis not present

## 2023-06-15 DIAGNOSIS — M6281 Muscle weakness (generalized): Secondary | ICD-10-CM | POA: Diagnosis not present

## 2023-06-15 NOTE — Therapy (Signed)
OUTPATIENT PHYSICAL THERAPY SHOULDER TREATMENT    Patient Name: Mary Freeman MRN: [id number] DOB:1951-02-01, 72 y.o., female Today's Date: 06/15/2023  END OF SESSION:  PT End of Session - 06/15/23 1458     Visit Number 22    Number of Visits 24    Date for PT Re-Evaluation 06/20/23    Authorization Type [employer] $20 copay    Authorization - Visit Number 30    Progress Note Due on Visit 30    PT Start Time 1445    PT Stop Time 1533    PT Time Calculation (min) 48 min              Past Medical History:  Diagnosis Date   Depression    Headache    migraines   PONV (postoperative nausea and vomiting)    Past Surgical History:  Procedure Laterality Date   APPENDECTOMY     BREAST EXCISIONAL BIOPSY Right    benign   COLONOSCOPY     KNEE ARTHROSCOPY Right    x3   KNEE ARTHROSCOPY W/ MENISCAL REPAIR Left    LUMBAR LAMINECTOMY  1986   REDUCTION MAMMAPLASTY Bilateral    2006   ROTATOR CUFF REPAIR Right 2012   TONSILLECTOMY     TUBAL LIGATION     Patient Active Problem List   Diagnosis Date Noted   Anxiety 02/11/2021   Adjustment disorder with depressed mood 02/11/2021   Chronic kidney disease, stage 3 unspecified (HCC) 02/11/2021   Collagenous colitis 02/11/2021   Gastroesophageal reflux disease 02/11/2021   Migraine without aura, not refractory 02/11/2021   Mild depression 02/11/2021   Osteopenia 02/11/2021   Early satiety 01/12/2021   Bloating 01/12/2021   Abdominal pain 01/12/2021    PCP: Dr Sigmund Hazel  REFERRING PROVIDER: Dr Francena Hanly   REFERRING DIAG: Rt shoulder torn RC  THERAPY DIAG:  Acute pain of right shoulder  Other symptoms and signs involving the musculoskeletal system  Abnormal posture  Muscle weakness (generalized)  Rationale for Evaluation and Treatment: Rehabilitation  ONSET DATE: 02/25/23; surgery 03/21/23  SUBJECTIVE:                                                                                                                                                                                       SUBJECTIVE STATEMENT: Patient reports that she has some discomfort in the R shoulder but no pain to speak of. Shirly reports that her R biceps is still hurting but she can do the pendulum and get rid of the biceps pain. Pain and aching in the biceps is coming and going.  Hand dominance: Right  PERTINENT HISTORY: Patient  reports falling onto Rt shoulder 02/25/23 sustaining torn RC and biceps. She underwent surgery 03/21/23 for [facility] repair and biceps tendon repair.   Lt shoulder scope RC, Lt 05/25/17 DCR/SAD/frayed RC/torn labrum; Rt shoulder pain; cervical pain 2018; LBP 2020; Rt knee pain 2022 - denies any medical problems   PAIN:  Are you having pain? Yes: NPRS scale: 0/10 to 3/10 Pain location: Rt shoulder; elbow; hand  Pain description: tightness; dull; aching; sore - seldom sharp Aggravating factors: moving  Relieving factors: meds; avoiding movement  PRECAUTIONS: Shoulder per protocol  WEIGHT BEARING RESTRICTIONS: No  FALLS:  Has patient fallen in last 6 months? No  LIVING ENVIRONMENT: Lives with: lives alone Lives in: House/apartment Stairs: Yes: Internal: 12 steps; on right going up and External: 3 steps; on right going up Has following equipment at home: Single point cane  OCCUPATION: Retired oncology RN 2021; active with household chores, gardening, yard work    PATIENT GOALS:get the arm working like normal again   NEXT MD VISIT: 07/10/23  OBJECTIVE:   UPPER EXTREMITY ROM:    ROM Active Right 06/05/23 Passive Right 06/05/23  Shoulder flexion 127 151  Shoulder extension 40 60  Shoulder abduction 123 142  Shoulder adduction    Shoulder internal rotation Hand to waist   Shoulder external rotation 47 elbow at side  80 in scaption   Elbow flexion    Elbow extension    Wrist flexion    Wrist extension    Wrist ulnar deviation    Wrist radial deviation    Wrist pronation    Wrist supination     (Blank rows = not tested)  UPPER EXTREMITY MMT:  MMT Right eval Left eval  Shoulder flexion    Shoulder extension    Shoulder abduction    Shoulder adduction    Shoulder internal rotation    Shoulder external rotation    Middle trapezius    Lower trapezius    Elbow flexion    Elbow extension    Wrist flexion    Wrist extension    Wrist ulnar deviation    Wrist radial deviation    Wrist pronation    Wrist supination    Grip strength (lbs)    (Blank rows = not tested)  PALPATION:  Muscular tightness Rt shoulder girdle  05/16/23: muscular tightness Rt lateral cervical musculature  06/05/23: muscular tightness Rt shoulder girdle  [facility] Adult PT Treatment:     (Dry needling)      DATE: 06/15/23 Therapeutic Exercise:  UBE L4 x 4.5 min alt fwd/back Shoulder flexion sliding hands up wall Scap squeeze with noodle  Doorway stretch 3 positions 30 sec x 1  Manual Therapy: (patient supine) Skilled palpation to assess response to manual work and DN PROM in supine within tissue tolerance  PROM R biceps pt supine UE off edge of table for shoulder extension and elbow extension 20-30 sec x 3 reps  SMT Rt upper upper quarter Trigger Point Dry-Needling  Treatment instructions: Expect mild to moderate muscle soreness. S/S of pneumothorax if dry needled over a lung field, and to seek immediate medical attention should they occur. Patient verbalized understanding of these instructions and education.  Patient Consent Given: Yes Education handout provided: Previously provided Muscles treated: R biceps to biceps tendon  Electrical stimulation performed: Yes Parameters: mAmp current; intensity to patient tolerance Treatment response/outcome: decreased palpable tightness and no aching per pt report     Modalities:  Vaso 34 deg; medium pressure x 10 min  Lt shoulder   [facility] Adult PT Treatment:                                                DATE: 06/12/23 Therapeutic Exercise: UBE L2 x 4  min - alternating fwd/back Doorway stretch 3 positions 30 sec x 1  Biceps stretch standing at table 15 sec x 3  Shoulder flexion hands at wall rolling foam roll up wall 5 sec x 10  Row green TB 3 sec x 10 Shoulder extension green TB 3 sec x 10  ER green TB R x 3 sec x 10  IR green TB R x 3 sec x 10  Standing shoulder flexion w/ noodle 2# DB 3 sec x 10  Standing scaption shoulder flexion w/ noodle 2# DB 3 sec x 10  Biceps curl 2# DB x 10  Wall push up x 10  Sidelying ER AROM 3# DB x 10  Sidelying abduction AROM x 10  Sidelying flexion AROM x 10  Supine rhythmic stabilization fwd/back; horizontal ab/ad; circles CW/CCW x 15-20  Triceps extension yellow TB x 10   Manual Therapy: (patient supine) SMT Rt upper upper quarter PROM in supine within tissue tolerance and protocol guidelines   Modalities:  Vaso 34 deg; medium pressure x 10 min Lt shoulder; moist heat to cervical spine  [facility] Adult PT Treatment:                                                DATE: 06/08/23 Therapeutic Exercise: Biceps stretch standing at table 15 sec x 3  Shoulder flexion hands at wall rolling foam roll up wall 5 sec x 10  Row green TB 3 sec x 10 ER green TB R x 3 sec x 10  IR green TB R x 3 sec x 10  Standing shoulder flexion w/ noodle 2# DB x 10  Standing scaption shoulder flexion w/ noodle 2# DB x 10  Wall push up x 10  Sidelying ER AROM 2# DB x 10  Sidelying abduction AROM x 10  Sidelying flexion AROM x 10  Supine rhythmic stabilization fwd/back; horizontal ab/ad; circles CW/CCW x 15-20  Triceps extension yellow TB x 10  Biceps curl 1# DB x 10  Manual Therapy: (patient supine) PROM in supine within tissue tolerance and protocol guidelines  SMT Rt upper upper quarter  Modalities:  Vaso 34 deg; medium pressure x 10 min Lt shoulder; moist heat to cervical spine    Therapeutic Exercise: (Reviewed exercises for home program) Pendulum A/P, laterally, CW/CCW all x 20 Pulley flexion 10 sec x 10   Pulley scaption 10 sec x 10  Pulley horizontal ab/ad at ~ 90 deg elevation Shoulder alphabet ball supported on table  ER with cane standing 10 sec x 10  Scap squeeze supine 10 sec x 10 Backwards shoulder rolls 3-5 sec x 10  Elbow flex/ext x 10 Pronation/supination x 10  Rolling green swiss ball into forward flexion x 10  Prone shoudler extension 3 sec x 10  Prone row 3 sec x 10  Biceps curl 1# x 10  Triceps extension supine 1# x 10  Biceps stretch standing 15 sec x 3 Shoulder flexion hands up wall x 10  Rhythmic stabilization supine multiple directions    PATIENT EDUCATION: Education details: POC; HEP  Person educated: Patient Education method: Explanation, Demonstration, Tactile cues, Verbal cues, and Handouts Education comprehension: verbalized understanding, returned demonstration, verbal cues required, tactile cues required, and needs further education  HOME EXERCISE PROGRAM:  Access Code: [email or web address] URL: [email or web address] Date: 06/05/2023 Prepared by: Corlis Leak  Exercises - Flexion-Extension Shoulder Pendulum with Table Support  - 3-4 x daily - 7 x weekly - 1 sets - 20-30 reps - Horizontal Shoulder Pendulum with Table Support  - 3-4 x daily - 7 x weekly - 1 sets - 20-30 reps - Circular Shoulder Pendulum with Table Support  - 3-4 x daily - 7 x weekly - 1 sets - 20-30 reps - Standing Shoulder Flexion AAROM with Swiss Ball  - 2 x daily - 7 x weekly - 1 sets - 10 reps - 5-10 sec  hold - Seated Scapular Retraction  - 2 x daily - 7 x weekly - 1-2 sets - 10 reps - 10 sec  hold - Standing Scapular Retraction  - 3 x daily - 7 x weekly - 1 sets - 10 reps - 10 sec  hold - Seated Shoulder Flexion AAROM with Pulley Behind  - 2 x daily - 7 x weekly - 1 sets - 10 reps - 10 sec  hold - Seated Shoulder Scaption AAROM with Pulley at Side  - 2 x daily - 7 x weekly - 1 sets - 10 reps - 10sec  hold - Seated Shoulder External Rotation AAROM with Cane and Hand in Neutral  - 2  x daily - 7 x weekly - 1 sets - 5-10 reps - 5-10 sec  hold - Shoulder Alphabet with Ball at Counter  - 2 x daily - 7 x weekly - 1 sets - Prone Shoulder Extension - Single Arm  - 1 x daily - 7 x weekly - 1-2 sets - 10 reps - 3  sec  hold - Prone Shoulder Row  - 1 x daily - 7 x weekly - 1-2 sets - 10 reps - 3 sec  hold - Seated Single Arm Bicep Curls Supinated with Dumbbell  - 1 x daily - 7 x weekly - 1-2 sets - 10 reps - 1-2 sec  hold - Supine Elbow Flexion Extension AROM  - 2 x daily - 7 x weekly - 2-3 sets - 10 reps - 3 sec  hold - Standing Shoulder Row Reactive Isometric  - 2 x daily - 7 x weekly - 1 sets - 10 reps - 30-45 sec  hold - Shoulder External Rotation Reactive Isometrics  - 1 x daily - 7 x weekly - 1 sets - 10 reps - 3-5 sec  hold - Shoulder Internal Rotation Reactive Isometrics  - 2 x daily - 7 x weekly - 1 sets - 10 reps - 3-5 sec  hold - Standing Shoulder Flexion to 90 Degrees with Dumbbells  - 1 x daily - 7 x weekly - 1-2 sets - 10 reps - 2 sec  hold - Scaption with Dumbbells  - 1 x daily - 7 x weekly - 1-2 sets - 10 reps - 2 sec  hold - Sidelying Shoulder External Rotation AROM  - 1 x daily - 7 x weekly - 1-2 sets - 10 reps - 2-3 sec  hold - Bicep Stretch at Table  - 2 x daily - 7 x weekly - 1 sets - 3 reps - 30 sec  hold - Standing Shoulder Flexion Stretch on Wall  - 1 x daily - 7 x weekly - 2 sets - 10 reps - 3-5 sec  hold - Supine Shoulder Rhythmic Stabilization- Horizontal Abduction/Adduction  - 1 x daily - 7 x weekly - 1 sets - 5-10 reps - Supine Triceps Extension with Resistance  - 1 x daily - 7 x weekly - 1 sets - 10 reps - 3 sec  hold - Standing Bilateral Low Shoulder Row with Anchored Resistance  - 2 x daily - 7 x weekly - 1-3 sets - 10 reps - 2-3 sec  hold - Shoulder External Rotation with Anchored Resistance  - 2 x daily - 7 x weekly - 1-2 sets - 10 reps - 3 sec  hold - Shoulder Internal Rotation with Resistance  - 2 x daily - 7 x weekly - 2 sets - 10 reps - 3 sec   hold  ASSESSMENT:  CLINICAL IMPRESSION: Honestie reports continued aching and tightness in the R biceps with functional activities. She has increased awareness of aching in the morning but she can get the arm to calm down with exercises. Overall she continues to progress well with R shoulder rehab with good gains in strength and functional abilities.  Trial of DN and manual work followed by PROM/stretch R shoulder and elbow. Stretching biceps. Excellent response to manual work, DN, stretching. Plan to reviewed and progressed exercises for ROM and stabilization R shoulder as patient tolerates.     RTD 07/10/23  OBJECTIVE IMPAIRMENTS: decreased activity tolerance, decreased mobility, decreased ROM, decreased strength, hypomobility, increased fascial restrictions, increased muscle spasms, impaired flexibility, impaired UE functional use, improper body mechanics, postural dysfunction, and pain.     GOALS: Goals reviewed with patient? Yes  SHORT TERM GOALS: Target date: 05/09/2023   Independent in initial HEP  Baseline: Goal status: INITIAL  2.  Patient independent in donning and doffing sling  Baseline:  Goal status: INITIAL   LONG TERM GOALS: Target date: 06/20/2023   Rt shoulder ROM WFL's  Baseline:  Goal status: INITIAL  2.  Rt shoulder strength 4+/5 to 5/5 throughout  Baseline:  Goal status: INITIAL  3.  Improve posture and alignment with improved upright posture and posterior shoulder girdle engaged  Baseline:  Goal status: INITIAL  4.  Patient reports decreased pain in Rt shoulder with functional activities to no more than 2/10  Baseline:  Goal status: INITIAL  5.  Independent in HEP including aquatic program as indicated  Baseline:  Goal status: INITIAL  PLAN:  PT FREQUENCY: 2x/week  PT DURATION: 8 weeks  PLANNED INTERVENTIONS: Therapeutic exercises, Therapeutic activity, Neuromuscular re-education, Patient/Family education, Self Care, Joint mobilization,  Aquatic Therapy, Dry Needling, Electrical stimulation, Spinal mobilization, Cryotherapy, Moist heat, Taping, Vasopneumatic device, Ultrasound, Ionotophoresis 4mg /ml Dexamethasone, Manual therapy, and Re-evaluation  PLAN FOR NEXT SESSION: review and progress exercise; continue with postural correction and education; manual work,DN, modalities as indicated     Rober Minion, PT 06/15/2023, 2:59 PM

## 2023-06-20 ENCOUNTER — Encounter: Payer: Self-pay | Admitting: Rehabilitative and Restorative Service Providers"

## 2023-06-20 ENCOUNTER — Ambulatory Visit: Payer: Medicare HMO | Admitting: Rehabilitative and Restorative Service Providers"

## 2023-06-20 DIAGNOSIS — M25511 Pain in right shoulder: Secondary | ICD-10-CM

## 2023-06-20 DIAGNOSIS — M6281 Muscle weakness (generalized): Secondary | ICD-10-CM | POA: Diagnosis not present

## 2023-06-20 DIAGNOSIS — R29898 Other symptoms and signs involving the musculoskeletal system: Secondary | ICD-10-CM

## 2023-06-20 DIAGNOSIS — R293 Abnormal posture: Secondary | ICD-10-CM | POA: Diagnosis not present

## 2023-06-20 NOTE — Addendum Note (Signed)
Addended by: Val Riles on: 06/20/2023 05:15 PM   Modules accepted: Orders

## 2023-06-20 NOTE — Therapy (Addendum)
OUTPATIENT PHYSICAL THERAPY SHOULDER TREATMENT    Patient Name: Mary Freeman MRN: [id number] DOB:1950/12/02, 72 y.o., female Today's Date: 06/20/2023  END OF SESSION:  PT End of Session - 06/20/23 1533     Visit Number 23    Number of Visits 32    Date for PT Re-Evaluation 07/18/23    Authorization Type [employer] $20 copay    Authorization - Visit Number 30    Progress Note Due on Visit 30    PT Start Time 1530    PT Stop Time 1618    PT Time Calculation (min) 48 min    Activity Tolerance Patient tolerated treatment well              Past Medical History:  Diagnosis Date   Depression    Headache    migraines   PONV (postoperative nausea and vomiting)    Past Surgical History:  Procedure Laterality Date   APPENDECTOMY     BREAST EXCISIONAL BIOPSY Right    benign   COLONOSCOPY     KNEE ARTHROSCOPY Right    x3   KNEE ARTHROSCOPY W/ MENISCAL REPAIR Left    LUMBAR LAMINECTOMY  1986   REDUCTION MAMMAPLASTY Bilateral    2006   ROTATOR CUFF REPAIR Right 2012   TONSILLECTOMY     TUBAL LIGATION     Patient Active Problem List   Diagnosis Date Noted   Anxiety 02/11/2021   Adjustment disorder with depressed mood 02/11/2021   Chronic kidney disease, stage 3 unspecified (HCC) 02/11/2021   Collagenous colitis 02/11/2021   Gastroesophageal reflux disease 02/11/2021   Migraine without aura, not refractory 02/11/2021   Mild depression 02/11/2021   Osteopenia 02/11/2021   Early satiety 01/12/2021   Bloating 01/12/2021   Abdominal pain 01/12/2021    PCP: Dr Sigmund Hazel  REFERRING PROVIDER: Dr Francena Hanly   REFERRING DIAG: Rt shoulder torn RC  THERAPY DIAG:  Acute pain of right shoulder  Other symptoms and signs involving the musculoskeletal system  Abnormal posture  Muscle weakness (generalized)  Rationale for Evaluation and Treatment: Rehabilitation  ONSET DATE: 02/25/23; surgery 03/21/23  SUBJECTIVE:                                                                                                                                                                                       SUBJECTIVE STATEMENT: Patient reports that the dry needling for the R biceps helped a lot. She had no pain for a couple of days. Now having some pain off and on for the R biceps. She has minimal discomfort in the R shoulder but no pain to speak of.  Hand dominance: Right  PERTINENT HISTORY: Patient reports falling onto Rt shoulder 02/25/23 sustaining torn RC and biceps. She underwent surgery 03/21/23 for [facility] repair and biceps tendon repair.   Lt shoulder scope RC, Lt 05/25/17 DCR/SAD/frayed RC/torn labrum; Rt shoulder pain; cervical pain 2018; LBP 2020; Rt knee pain 2022 - denies any medical problems   PAIN:  Are you having pain? Yes: NPRS scale: 3-4/10 Pain location: R biceps  Pain description: tightness; dull; aching; sore - seldom sharp Aggravating factors: moving  Relieving factors: meds; avoiding movement  PRECAUTIONS: Shoulder per protocol  WEIGHT BEARING RESTRICTIONS: No  FALLS:  Has patient fallen in last 6 months? No  LIVING ENVIRONMENT: Lives with: lives alone Lives in: House/apartment Stairs: Yes: Internal: 12 steps; on right going up and External: 3 steps; on right going up Has following equipment at home: Single point cane  OCCUPATION: Retired oncology RN 2021; active with household chores, gardening, yard work    PATIENT GOALS:get the arm working like normal again   NEXT MD VISIT: 07/10/23  OBJECTIVE:   UPPER EXTREMITY ROM:    ROM Active Right 06/05/23 Passive Right 06/05/23  Shoulder flexion 127 151  Shoulder extension 40 60  Shoulder abduction 123 142  Shoulder adduction    Shoulder internal rotation Hand to waist   Shoulder external rotation 47 elbow at side  80 in scaption   Elbow flexion    Elbow extension    Wrist flexion    Wrist extension    Wrist ulnar deviation    Wrist radial deviation    Wrist pronation     Wrist supination    (Blank rows = not tested)  UPPER EXTREMITY MMT:  MMT Right eval Left eval  Shoulder flexion    Shoulder extension    Shoulder abduction    Shoulder adduction    Shoulder internal rotation    Shoulder external rotation    Middle trapezius    Lower trapezius    Elbow flexion    Elbow extension    Wrist flexion    Wrist extension    Wrist ulnar deviation    Wrist radial deviation    Wrist pronation    Wrist supination    Grip strength (lbs)    (Blank rows = not tested)  PALPATION:  Muscular tightness Rt shoulder girdle  05/16/23: muscular tightness Rt lateral cervical musculature  06/05/23: muscular tightness Rt shoulder girdle   [facility] Adult PT Treatment:     (Dry needling)      DATE: 06/20/23 Therapeutic Exercise:  UBE L4 x 4 min alt fwd/back Shoulder flexion sliding hands up wall Scap squeeze with noodle  Doorway stretch 2 positions 30 sec x 1(painful in higher position) Corner stretch 3 positions 30 sec x 2  T at wall R arm at 90 deg abd for biceps stretch 10 sec x 3  ER ball on wall  Wall angel x 10  Wall push up x 10   Manual Therapy: (patient supine) Skilled palpation to assess response to manual work and DN PROM in supine within tissue tolerance  PROM R biceps pt supine UE off edge of table for shoulder extension and elbow extension 20-30 sec x 3 reps  SMT Rt upper upper quarter Joint mobs inferior grade II/III  Trigger Point Dry-Needling  Treatment instructions: Expect mild to moderate muscle soreness. S/S of pneumothorax if dry needled over a lung field, and to seek immediate medical attention should they occur. Patient verbalized understanding of these instructions and  education.  Patient Consent Given: Yes Education handout provided: Previously provided Muscles treated: R biceps to biceps tendon  Electrical stimulation performed: Yes Parameters: mAmp current; intensity to patient tolerance Treatment response/outcome: decreased  palpable tightness and no aching per pt report     Modalities:  Vaso 34 deg; medium pressure x 10 min Lt shoulder  [facility] Adult PT Treatment:     (Dry needling)      DATE: 06/15/23 Therapeutic Exercise:  UBE L4 x 4.5 min alt fwd/back Shoulder flexion sliding hands up wall Scap squeeze with noodle  Doorway stretch 3 positions 30 sec x 1  Manual Therapy: (patient supine) Skilled palpation to assess response to manual work and DN PROM in supine within tissue tolerance  PROM R biceps pt supine UE off edge of table for shoulder extension and elbow extension 20-30 sec x 3 reps  SMT Rt upper upper quarter Trigger Point Dry-Needling  Treatment instructions: Expect mild to moderate muscle soreness. S/S of pneumothorax if dry needled over a lung field, and to seek immediate medical attention should they occur. Patient verbalized understanding of these instructions and education.  Patient Consent Given: Yes Education handout provided: Previously provided Muscles treated: R biceps to biceps tendon  Electrical stimulation performed: Yes Parameters: mAmp current; intensity to patient tolerance Treatment response/outcome: decreased palpable tightness and no aching per pt report     Modalities:  Vaso 34 deg; medium pressure x 10 min Lt shoulder   [facility] Adult PT Treatment:                                                DATE: 06/12/23 Therapeutic Exercise: UBE L2 x 4 min - alternating fwd/back Doorway stretch 3 positions 30 sec x 1  Biceps stretch standing at table 15 sec x 3  Shoulder flexion hands at wall rolling foam roll up wall 5 sec x 10  Row green TB 3 sec x 10 Shoulder extension green TB 3 sec x 10  ER green TB R x 3 sec x 10  IR green TB R x 3 sec x 10  Standing shoulder flexion w/ noodle 2# DB 3 sec x 10  Standing scaption shoulder flexion w/ noodle 2# DB 3 sec x 10  Biceps curl 2# DB x 10  Wall push up x 10  Sidelying ER AROM 3# DB x 10  Sidelying abduction AROM x 10  Sidelying  flexion AROM x 10  Supine rhythmic stabilization fwd/back; horizontal ab/ad; circles CW/CCW x 15-20  Triceps extension yellow TB x 10   Manual Therapy: (patient supine) SMT Rt upper upper quarter PROM in supine within tissue tolerance and protocol guidelines   Modalities:  Vaso 34 deg; medium pressure x 10 min Lt shoulder; moist heat to cervical spine  Therapeutic Exercise: (For home program) Pendulum A/P, laterally, CW/CCW all x 20 Pulley flexion 10 sec x 10  Pulley scaption 10 sec x 10  Pulley horizontal ab/ad at ~ 90 deg elevation Shoulder alphabet ball supported on table  ER with cane standing 10 sec x 10  Scap squeeze supine 10 sec x 10 Backwards shoulder rolls 3-5 sec x 10  Elbow flex/ext x 10 Pronation/supination x 10  Rolling green swiss ball into forward flexion x 10  Prone shoudler extension 3 sec x 10  Prone row 3 sec x 10  Biceps curl  1# x 10  Triceps extension supine 1# x 10  Biceps stretch standing 15 sec x 3 Shoulder flexion hands up wall x 10  Rhythmic stabilization supine multiple directions  Wall angel  ER ball at dorsum of hand  Wall push up    PATIENT EDUCATION: Education details: POC; HEP  Person educated: Patient Education method: Programmer, multimedia, Demonstration, Actor cues, Verbal cues, and Handouts Education comprehension: verbalized understanding, returned demonstration, verbal cues required, tactile cues required, and needs further education  HOME EXERCISE PROGRAM: Access Code: [email or web address] URL: [email or web address] Date: 06/20/2023 Prepared by: Corlis Leak  Exercises - Flexion-Extension Shoulder Pendulum with Table Support  - 3-4 x daily - 7 x weekly - 1 sets - 20-30 reps - Horizontal Shoulder Pendulum with Table Support  - 3-4 x daily - 7 x weekly - 1 sets - 20-30 reps - Circular Shoulder Pendulum with Table Support  - 3-4 x daily - 7 x weekly - 1 sets - 20-30 reps - Standing Shoulder Flexion AAROM with Swiss Ball  - 2 x daily - 7  x weekly - 1 sets - 10 reps - 5-10 sec  hold - Seated Scapular Retraction  - 2 x daily - 7 x weekly - 1-2 sets - 10 reps - 10 sec  hold - Standing Scapular Retraction  - 3 x daily - 7 x weekly - 1 sets - 10 reps - 10 sec  hold - Seated Shoulder Flexion AAROM with Pulley Behind  - 2 x daily - 7 x weekly - 1 sets - 10 reps - 10 sec  hold - Seated Shoulder Scaption AAROM with Pulley at Side  - 2 x daily - 7 x weekly - 1 sets - 10 reps - 10sec  hold - Seated Shoulder External Rotation AAROM with Cane and Hand in Neutral  - 2 x daily - 7 x weekly - 1 sets - 5-10 reps - 5-10 sec  hold - Shoulder Alphabet with Ball at Counter  - 2 x daily - 7 x weekly - 1 sets - Prone Shoulder Extension - Single Arm  - 1 x daily - 7 x weekly - 1-2 sets - 10 reps - 3  sec  hold - Prone Shoulder Row  - 1 x daily - 7 x weekly - 1-2 sets - 10 reps - 3 sec  hold - Seated Single Arm Bicep Curls Supinated with Dumbbell  - 1 x daily - 7 x weekly - 1-2 sets - 10 reps - 1-2 sec  hold - Supine Elbow Flexion Extension AROM  - 2 x daily - 7 x weekly - 2-3 sets - 10 reps - 3 sec  hold - Standing Shoulder Row Reactive Isometric  - 2 x daily - 7 x weekly - 1 sets - 10 reps - 30-45 sec  hold - Shoulder External Rotation Reactive Isometrics  - 1 x daily - 7 x weekly - 1 sets - 10 reps - 3-5 sec  hold - Shoulder Internal Rotation Reactive Isometrics  - 2 x daily - 7 x weekly - 1 sets - 10 reps - 3-5 sec  hold - Standing Shoulder Flexion to 90 Degrees with Dumbbells  - 1 x daily - 7 x weekly - 1-2 sets - 10 reps - 2 sec  hold - Scaption with Dumbbells  - 1 x daily - 7 x weekly - 1-2 sets - 10 reps - 2 sec  hold - Sidelying Shoulder External Rotation AROM  - 1 x daily -  7 x weekly - 1-2 sets - 10 reps - 2-3 sec  hold - Bicep Stretch at Table  - 2 x daily - 7 x weekly - 1 sets - 3 reps - 30 sec  hold - Standing Shoulder Flexion Stretch on Wall  - 1 x daily - 7 x weekly - 2 sets - 10 reps - 3-5 sec  hold - Supine Shoulder Rhythmic  Stabilization- Horizontal Abduction/Adduction  - 1 x daily - 7 x weekly - 1 sets - 5-10 reps - Supine Triceps Extension with Resistance  - 1 x daily - 7 x weekly - 1 sets - 10 reps - 3 sec  hold - Standing Bilateral Low Shoulder Row with Anchored Resistance  - 2 x daily - 7 x weekly - 1-3 sets - 10 reps - 2-3 sec  hold - Shoulder External Rotation with Anchored Resistance  - 2 x daily - 7 x weekly - 1-2 sets - 10 reps - 3 sec  hold - Shoulder Internal Rotation with Resistance  - 2 x daily - 7 x weekly - 2 sets - 10 reps - 3 sec  hold - Standing Bicep Stretch at Wall  - 2 x daily - 7 x weekly - 1 sets - 3 reps - 10-30 sec  hold - Wall Push Up  - 1 x daily - 7 x weekly - 1-3 sets - 10 reps - 3 sec  hold - Wall Angels  - 2 x daily - 7 x weekly - 1 sets - 10 reps - 2-3 sec  hold - Isometric Shoulder External Rotation in Abduction with Ball at Wall  - 1 x daily - 7 x weekly - 1-2 min hold   ASSESSMENT:  CLINICAL IMPRESSION: Cahterine reports excellent response to DN for the R biceps with some soreness but no pain in the R biceps for 2 days. She has some increased aching and tightness in the R biceps with functional activities. Overall she continues to progress well with R shoulder rehab with good gains in strength and functional abilities.  Continued DN and manual work followed by PROM/stretch R shoulder and elbow. Added additional stretch for biceps. Good response to manual work, DN, stretching. Plan to reviewed and progressed exercises for ROM and stabilization R shoulder as patient tolerates.     RTD 07/10/23  OBJECTIVE IMPAIRMENTS: decreased activity tolerance, decreased mobility, decreased ROM, decreased strength, hypomobility, increased fascial restrictions, increased muscle spasms, impaired flexibility, impaired UE functional use, improper body mechanics, postural dysfunction, and pain.     GOALS: Goals reviewed with patient? Yes  SHORT TERM GOALS: Target date: 05/09/2023   Independent in  initial HEP  Baseline: Goal status: met  2.  Patient independent in donning and doffing sling  Baseline:  Goal status: met   LONG TERM GOALS: Target date: 07/18/2023   Rt shoulder ROM WFL's  Baseline:  Goal status:met  2.  Rt shoulder strength 4+/5 to 5/5 throughout  Baseline:  Goal status: on going   3.  Improve posture and alignment with improved upright posture and posterior shoulder girdle engaged  Baseline:  Goal status: on going   4.  Patient reports decreased pain in Rt shoulder with functional activities to no more than 2/10  Baseline:  Goal status: partially met - pain in R biceps   5.  Independent in HEP including aquatic program as indicated  Baseline:  Goal status: on going   PLAN:  PT FREQUENCY: 2x/week  PT DURATION: 4 weeks  PLANNED INTERVENTIONS: Therapeutic exercises, Therapeutic activity, Neuromuscular re-education, Patient/Family education, Self Care, Joint mobilization, Aquatic Therapy, Dry Needling, Electrical stimulation, Spinal mobilization, Cryotherapy, Moist heat, Taping, Vasopneumatic device, Ultrasound, Ionotophoresis 4mg /ml Dexamethasone, Manual therapy, and Re-evaluation  PLAN FOR NEXT SESSION: review and progress exercise; continue with postural correction and education; manual work,DN, modalities as indicated     Rober Minion, PT 06/20/2023, 3:35 PM

## 2023-06-22 ENCOUNTER — Ambulatory Visit: Payer: Medicare HMO | Admitting: Physical Therapy

## 2023-06-22 ENCOUNTER — Encounter: Payer: Self-pay | Admitting: Physical Therapy

## 2023-06-22 DIAGNOSIS — M25511 Pain in right shoulder: Secondary | ICD-10-CM | POA: Diagnosis not present

## 2023-06-22 DIAGNOSIS — R29898 Other symptoms and signs involving the musculoskeletal system: Secondary | ICD-10-CM

## 2023-06-22 DIAGNOSIS — R293 Abnormal posture: Secondary | ICD-10-CM | POA: Diagnosis not present

## 2023-06-22 DIAGNOSIS — M6281 Muscle weakness (generalized): Secondary | ICD-10-CM | POA: Diagnosis not present

## 2023-06-22 NOTE — Therapy (Signed)
OUTPATIENT PHYSICAL THERAPY SHOULDER TREATMENT    Patient Name: Mary Freeman MRN: [id number] DOB:1951/11/11, 72 y.o., female Today's Date: 06/22/2023  END OF SESSION:  PT End of Session - 06/22/23 1405     Visit Number 24    Number of Visits 32    Date for PT Re-Evaluation 07/18/23    Authorization Type [employer] medicare $20 copay    Progress Note Due on Visit 30    PT Start Time 1406    PT Stop Time 1445    PT Time Calculation (min) 39 min    Activity Tolerance Patient tolerated treatment well              Past Medical History:  Diagnosis Date   Depression    Headache    migraines   PONV (postoperative nausea and vomiting)    Past Surgical History:  Procedure Laterality Date   APPENDECTOMY     BREAST EXCISIONAL BIOPSY Right    benign   COLONOSCOPY     KNEE ARTHROSCOPY Right    x3   KNEE ARTHROSCOPY W/ MENISCAL REPAIR Left    LUMBAR LAMINECTOMY  1986   REDUCTION MAMMAPLASTY Bilateral    2006   ROTATOR CUFF REPAIR Right 2012   TONSILLECTOMY     TUBAL LIGATION     Patient Active Problem List   Diagnosis Date Noted   Anxiety 02/11/2021   Adjustment disorder with depressed mood 02/11/2021   Chronic kidney disease, stage 3 unspecified (HCC) 02/11/2021   Collagenous colitis 02/11/2021   Gastroesophageal reflux disease 02/11/2021   Migraine without aura, not refractory 02/11/2021   Mild depression 02/11/2021   Osteopenia 02/11/2021   Early satiety 01/12/2021   Bloating 01/12/2021   Abdominal pain 01/12/2021    PCP: Dr Sigmund Hazel  REFERRING PROVIDER: Dr Francena Hanly   REFERRING DIAG: Rt shoulder torn RC  THERAPY DIAG:  Acute pain of right shoulder  Other symptoms and signs involving the musculoskeletal system  Abnormal posture  Muscle weakness (generalized)  Rationale for Evaluation and Treatment: Rehabilitation  ONSET DATE: 02/25/23; surgery 03/21/23  SUBJECTIVE:                                                                                                                                                                                       SUBJECTIVE STATEMENT: Patient states her arm is hurt on/off today. No exercises today have hurt her. Pt is not in constant pain. Pt states dry needling continues to help.  Hand dominance: Right  PERTINENT HISTORY: Patient reports falling onto Rt shoulder 02/25/23 sustaining torn RC and biceps. She underwent surgery 03/21/23 for [facility] repair and biceps tendon  repair.   Lt shoulder scope RC, Lt 05/25/17 DCR/SAD/frayed RC/torn labrum; Rt shoulder pain; cervical pain 2018; LBP 2020; Rt knee pain 2022 - denies any medical problems   PAIN:  Are you having pain? Yes: NPRS scale: 3-4/10 Pain location: R biceps  Pain description: tightness; dull; aching; sore - seldom sharp Aggravating factors: moving  Relieving factors: meds; avoiding movement  PRECAUTIONS: Shoulder per protocol  WEIGHT BEARING RESTRICTIONS: No  FALLS:  Has patient fallen in last 6 months? No  LIVING ENVIRONMENT: Lives with: lives alone Lives in: House/apartment Stairs: Yes: Internal: 12 steps; on right going up and External: 3 steps; on right going up Has following equipment at home: Single point cane  OCCUPATION: Retired oncology RN 2021; active with household chores, gardening, yard work    PATIENT GOALS:get the arm working like normal again   NEXT MD VISIT: 07/10/23  OBJECTIVE:   UPPER EXTREMITY ROM:    ROM Active Right 06/05/23 Passive Right 06/05/23  Shoulder flexion 127 151  Shoulder extension 40 60  Shoulder abduction 123 142  Shoulder adduction    Shoulder internal rotation Hand to waist   Shoulder external rotation 47 elbow at side  80 in scaption   Elbow flexion    Elbow extension    Wrist flexion    Wrist extension    Wrist ulnar deviation    Wrist radial deviation    Wrist pronation    Wrist supination    (Blank rows = not tested)  UPPER EXTREMITY MMT:  MMT Right eval Left eval  Shoulder  flexion    Shoulder extension    Shoulder abduction    Shoulder adduction    Shoulder internal rotation    Shoulder external rotation    Middle trapezius    Lower trapezius    Elbow flexion    Elbow extension    Wrist flexion    Wrist extension    Wrist ulnar deviation    Wrist radial deviation    Wrist pronation    Wrist supination    Grip strength (lbs)    (Blank rows = not tested)  PALPATION:  Muscular tightness Rt shoulder girdle  05/16/23: muscular tightness Rt lateral cervical musculature  06/05/23: muscular tightness Rt shoulder girdle  [facility] Adult PT Treatment:                                                DATE: 06/22/23 Therapeutic Exercise: UBE L4, 2 min fwd, 2 min bwd Bicep stretch against doorway thumb against door, palm flat against door 2x30 sec each Corner pec stretch mid and high x30 sec each Shoulder flexion sliding hands up wall Pool noodle against back Scap squeeze 2x10 "W" 2x10 Horizontal abd with elbows bent 2x10 (limited due L side flank pain) Manual Therapy: STM & TPR biceps, pecs Skilled palpation to assess response to manual work and DN Trigger Point Dry-Needling  Treatment instructions: Expect mild to moderate muscle soreness. S/S of pneumothorax if dry needled over a lung field, and to seek immediate medical attention should they occur. Patient verbalized understanding of these instructions and education.  Patient Consent Given: Yes Education handout provided: Previously provided Muscles treated: R biceps to biceps tendon  Electrical stimulation performed: Yes Parameters: mAmp current; intensity to patient tolerance Treatment response/outcome: decreased palpable tightness and no aching per pt report    Modalities:  Vaso 34 deg; medium pressure x 10 min Lt shoulder    [facility] Adult PT Treatment:     (Dry needling)      DATE: 06/20/23 Therapeutic Exercise: UBE L4 x 4 min alt fwd/back Shoulder flexion sliding hands up wall Scap squeeze with noodle   Doorway stretch 2 positions 30 sec x 1(painful in higher position) Corner stretch 3 positions 30 sec x 2  T at wall R arm at 90 deg abd for biceps stretch 10 sec x 3  ER ball on wall  Wall angel x 10  Wall push up x 10  Manual Therapy: (patient supine) Skilled palpation to assess response to manual work and DN PROM in supine within tissue tolerance  PROM R biceps pt supine UE off edge of table for shoulder extension and elbow extension 20-30 sec x 3 reps  SMT Rt upper upper quarter Joint mobs inferior grade II/III  Trigger Point Dry-Needling  Treatment instructions: Expect mild to moderate muscle soreness. S/S of pneumothorax if dry needled over a lung field, and to seek immediate medical attention should they occur. Patient verbalized understanding of these instructions and education.  Patient Consent Given: Yes Education handout provided: Previously provided Muscles treated: R biceps to biceps tendon  Electrical stimulation performed: Yes Parameters: mAmp current; intensity to patient tolerance Treatment response/outcome: decreased palpable tightness and no aching per pt report    Modalities:  Vaso 34 deg; medium pressure x 10 min Lt shoulder  [facility] Adult PT Treatment:     (Dry needling)      DATE: 06/15/23 Therapeutic Exercise: UBE L4 x 4.5 min alt fwd/back Shoulder flexion sliding hands up wall Scap squeeze with noodle  Doorway stretch 3 positions 30 sec x 1  Manual Therapy: (patient supine) Skilled palpation to assess response to manual work and DN PROM in supine within tissue tolerance  PROM R biceps pt supine UE off edge of table for shoulder extension and elbow extension 20-30 sec x 3 reps  SMT Rt upper upper quarter Trigger Point Dry-Needling  Treatment instructions: Expect mild to moderate muscle soreness. S/S of pneumothorax if dry needled over a lung field, and to seek immediate medical attention should they occur. Patient verbalized understanding of these  instructions and education.  Patient Consent Given: Yes Education handout provided: Previously provided Muscles treated: R biceps to biceps tendon  Electrical stimulation performed: Yes Parameters: mAmp current; intensity to patient tolerance Treatment response/outcome: decreased palpable tightness and no aching per pt report     Modalities:  Vaso 34 deg; medium pressure x 10 min Lt shoulder    PATIENT EDUCATION: Education details: POC; HEP  Person educated: Patient Education method: Programmer, multimedia, Demonstration, Actor cues, Verbal cues, and Handouts Education comprehension: verbalized understanding, returned demonstration, verbal cues required, tactile cues required, and needs further education  HOME EXERCISE PROGRAM: Access Code: [email or web address] URL: [email or web address] Date: 06/20/2023 Prepared by: Corlis Leak  Exercises - Flexion-Extension Shoulder Pendulum with Table Support  - 3-4 x daily - 7 x weekly - 1 sets - 20-30 reps - Horizontal Shoulder Pendulum with Table Support  - 3-4 x daily - 7 x weekly - 1 sets - 20-30 reps - Circular Shoulder Pendulum with Table Support  - 3-4 x daily - 7 x weekly - 1 sets - 20-30 reps - Standing Shoulder Flexion AAROM with Swiss Ball  - 2 x daily - 7 x weekly - 1 sets - 10 reps - 5-10 sec  hold - Seated Scapular Retraction  - 2  x daily - 7 x weekly - 1-2 sets - 10 reps - 10 sec  hold - Standing Scapular Retraction  - 3 x daily - 7 x weekly - 1 sets - 10 reps - 10 sec  hold - Seated Shoulder Flexion AAROM with Pulley Behind  - 2 x daily - 7 x weekly - 1 sets - 10 reps - 10 sec  hold - Seated Shoulder Scaption AAROM with Pulley at Side  - 2 x daily - 7 x weekly - 1 sets - 10 reps - 10sec  hold - Seated Shoulder External Rotation AAROM with Cane and Hand in Neutral  - 2 x daily - 7 x weekly - 1 sets - 5-10 reps - 5-10 sec  hold - Shoulder Alphabet with Ball at Counter  - 2 x daily - 7 x weekly - 1 sets - Prone Shoulder Extension -  Single Arm  - 1 x daily - 7 x weekly - 1-2 sets - 10 reps - 3  sec  hold - Prone Shoulder Row  - 1 x daily - 7 x weekly - 1-2 sets - 10 reps - 3 sec  hold - Seated Single Arm Bicep Curls Supinated with Dumbbell  - 1 x daily - 7 x weekly - 1-2 sets - 10 reps - 1-2 sec  hold - Supine Elbow Flexion Extension AROM  - 2 x daily - 7 x weekly - 2-3 sets - 10 reps - 3 sec  hold - Standing Shoulder Row Reactive Isometric  - 2 x daily - 7 x weekly - 1 sets - 10 reps - 30-45 sec  hold - Shoulder External Rotation Reactive Isometrics  - 1 x daily - 7 x weekly - 1 sets - 10 reps - 3-5 sec  hold - Shoulder Internal Rotation Reactive Isometrics  - 2 x daily - 7 x weekly - 1 sets - 10 reps - 3-5 sec  hold - Standing Shoulder Flexion to 90 Degrees with Dumbbells  - 1 x daily - 7 x weekly - 1-2 sets - 10 reps - 2 sec  hold - Scaption with Dumbbells  - 1 x daily - 7 x weekly - 1-2 sets - 10 reps - 2 sec  hold - Sidelying Shoulder External Rotation AROM  - 1 x daily - 7 x weekly - 1-2 sets - 10 reps - 2-3 sec  hold - Bicep Stretch at Table  - 2 x daily - 7 x weekly - 1 sets - 3 reps - 30 sec  hold - Standing Shoulder Flexion Stretch on Wall  - 1 x daily - 7 x weekly - 2 sets - 10 reps - 3-5 sec  hold - Supine Shoulder Rhythmic Stabilization- Horizontal Abduction/Adduction  - 1 x daily - 7 x weekly - 1 sets - 5-10 reps - Supine Triceps Extension with Resistance  - 1 x daily - 7 x weekly - 1 sets - 10 reps - 3 sec  hold - Standing Bilateral Low Shoulder Row with Anchored Resistance  - 2 x daily - 7 x weekly - 1-3 sets - 10 reps - 2-3 sec  hold - Shoulder External Rotation with Anchored Resistance  - 2 x daily - 7 x weekly - 1-2 sets - 10 reps - 3 sec  hold - Shoulder Internal Rotation with Resistance  - 2 x daily - 7 x weekly - 2 sets - 10 reps - 3 sec  hold - Standing Bicep Stretch at  Wall  - 2 x daily - 7 x weekly - 1 sets - 3 reps - 10-30 sec  hold - Wall Push Up  - 1 x daily - 7 x weekly - 1-3 sets - 10 reps - 3 sec   hold - Wall Angels  - 2 x daily - 7 x weekly - 1 sets - 10 reps - 2-3 sec  hold - Isometric Shoulder External Rotation in Abduction with Ball at Wall  - 1 x daily - 7 x weekly - 1-2 min hold   ASSESSMENT:  CLINICAL IMPRESSION: Continued TPDN to address pt's biceps pain. Worked on reviewing her pec and bicep stretches. Continued scapular strengthening.    RTD 07/10/23  OBJECTIVE IMPAIRMENTS: decreased activity tolerance, decreased mobility, decreased ROM, decreased strength, hypomobility, increased fascial restrictions, increased muscle spasms, impaired flexibility, impaired UE functional use, improper body mechanics, postural dysfunction, and pain.     GOALS: Goals reviewed with patient? Yes  SHORT TERM GOALS: Target date: 05/09/2023   Independent in initial HEP  Baseline: Goal status: met  2.  Patient independent in donning and doffing sling  Baseline:  Goal status: met   LONG TERM GOALS: Target date: 07/18/2023   Rt shoulder ROM WFL's  Baseline:  Goal status:met  2.  Rt shoulder strength 4+/5 to 5/5 throughout  Baseline:  Goal status: on going   3.  Improve posture and alignment with improved upright posture and posterior shoulder girdle engaged  Baseline:  Goal status: on going   4.  Patient reports decreased pain in Rt shoulder with functional activities to no more than 2/10  Baseline:  Goal status: partially met - pain in R biceps   5.  Independent in HEP including aquatic program as indicated  Baseline:  Goal status: on going   PLAN:  PT FREQUENCY: 2x/week  PT DURATION: 4 weeks  PLANNED INTERVENTIONS: Therapeutic exercises, Therapeutic activity, Neuromuscular re-education, Patient/Family education, Self Care, Joint mobilization, Aquatic Therapy, Dry Needling, Electrical stimulation, Spinal mobilization, Cryotherapy, Moist heat, Taping, Vasopneumatic device, Ultrasound, Ionotophoresis 4mg /ml Dexamethasone, Manual therapy, and Re-evaluation  PLAN FOR  NEXT SESSION: review and progress exercise; continue with postural correction and education; manual work,DN, modalities as indicated     April Ma L , PT 06/22/2023, 2:06 PM

## 2023-06-26 ENCOUNTER — Encounter: Payer: Self-pay | Admitting: Rehabilitative and Restorative Service Providers"

## 2023-06-26 ENCOUNTER — Ambulatory Visit: Payer: Medicare HMO | Admitting: Rehabilitative and Restorative Service Providers"

## 2023-06-26 DIAGNOSIS — M9904 Segmental and somatic dysfunction of sacral region: Secondary | ICD-10-CM | POA: Diagnosis not present

## 2023-06-26 DIAGNOSIS — R293 Abnormal posture: Secondary | ICD-10-CM | POA: Diagnosis not present

## 2023-06-26 DIAGNOSIS — R29898 Other symptoms and signs involving the musculoskeletal system: Secondary | ICD-10-CM

## 2023-06-26 DIAGNOSIS — M9903 Segmental and somatic dysfunction of lumbar region: Secondary | ICD-10-CM | POA: Diagnosis not present

## 2023-06-26 DIAGNOSIS — M9902 Segmental and somatic dysfunction of thoracic region: Secondary | ICD-10-CM | POA: Diagnosis not present

## 2023-06-26 DIAGNOSIS — M25511 Pain in right shoulder: Secondary | ICD-10-CM

## 2023-06-26 DIAGNOSIS — M6281 Muscle weakness (generalized): Secondary | ICD-10-CM

## 2023-06-26 DIAGNOSIS — M5136 Other intervertebral disc degeneration, lumbar region: Secondary | ICD-10-CM | POA: Diagnosis not present

## 2023-06-26 DIAGNOSIS — M5137 Other intervertebral disc degeneration, lumbosacral region: Secondary | ICD-10-CM | POA: Diagnosis not present

## 2023-06-26 DIAGNOSIS — M5032 Other cervical disc degeneration, mid-cervical region, unspecified level: Secondary | ICD-10-CM | POA: Diagnosis not present

## 2023-06-26 DIAGNOSIS — M5134 Other intervertebral disc degeneration, thoracic region: Secondary | ICD-10-CM | POA: Diagnosis not present

## 2023-06-26 DIAGNOSIS — M9901 Segmental and somatic dysfunction of cervical region: Secondary | ICD-10-CM | POA: Diagnosis not present

## 2023-06-26 NOTE — Therapy (Signed)
OUTPATIENT PHYSICAL THERAPY SHOULDER TREATMENT    Patient Name: Mary Freeman MRN: [id number] DOB:07/15/1951, 72 y.o., female Today's Date: 06/26/2023  END OF SESSION:  PT End of Session - 06/26/23 1313     Visit Number 25    Number of Visits 32    Date for PT Re-Evaluation 07/18/23    Authorization Type [employer] $20 copay    Authorization - Visit Number 30    Progress Note Due on Visit 30    PT Start Time 1314    PT Stop Time 1402    PT Time Calculation (min) 48 min    Activity Tolerance Patient tolerated treatment well              Past Medical History:  Diagnosis Date   Depression    Headache    migraines   PONV (postoperative nausea and vomiting)    Past Surgical History:  Procedure Laterality Date   APPENDECTOMY     BREAST EXCISIONAL BIOPSY Right    benign   COLONOSCOPY     KNEE ARTHROSCOPY Right    x3   KNEE ARTHROSCOPY W/ MENISCAL REPAIR Left    LUMBAR LAMINECTOMY  1986   REDUCTION MAMMAPLASTY Bilateral    2006   ROTATOR CUFF REPAIR Right 2012   TONSILLECTOMY     TUBAL LIGATION     Patient Active Problem List   Diagnosis Date Noted   Anxiety 02/11/2021   Adjustment disorder with depressed mood 02/11/2021   Chronic kidney disease, stage 3 unspecified (HCC) 02/11/2021   Collagenous colitis 02/11/2021   Gastroesophageal reflux disease 02/11/2021   Migraine without aura, not refractory 02/11/2021   Mild depression 02/11/2021   Osteopenia 02/11/2021   Early satiety 01/12/2021   Bloating 01/12/2021   Abdominal pain 01/12/2021    PCP: Dr Sigmund Hazel  REFERRING PROVIDER: Dr Francena Hanly   REFERRING DIAG: Rt shoulder torn RC  THERAPY DIAG:  Acute pain of right shoulder  Other symptoms and signs involving the musculoskeletal system  Abnormal posture  Muscle weakness (generalized)  Rationale for Evaluation and Treatment: Rehabilitation  ONSET DATE: 02/25/23; surgery 03/21/23  SUBJECTIVE:                                                                                                                                                                                       SUBJECTIVE STATEMENT: Patient states her arm is hurting in the biceps area. The dry needling helps for a couple of days. Shoulder exercises are fine. Uses ice and takes tylenol for the pain.  Hand dominance: Right  PERTINENT HISTORY: Patient reports falling onto Rt shoulder 02/25/23 sustaining torn RC  and biceps. She underwent surgery 03/21/23 for [facility] repair and biceps tendon repair.   Lt shoulder scope RC, Lt 05/25/17 DCR/SAD/frayed RC/torn labrum; Rt shoulder pain; cervical pain 2018; LBP 2020; Rt knee pain 2022 - denies any medical problems   PAIN:  Are you having pain? Yes: NPRS scale: 3-4/10 Pain location: R biceps  Pain description: tightness; dull; aching; sore - seldom sharp Aggravating factors: moving  Relieving factors: meds; avoiding movement  PRECAUTIONS: Shoulder per protocol  WEIGHT BEARING RESTRICTIONS: No  FALLS:  Has patient fallen in last 6 months? No  LIVING ENVIRONMENT: Lives with: lives alone Lives in: House/apartment Stairs: Yes: Internal: 12 steps; on right going up and External: 3 steps; on right going up Has following equipment at home: Single point cane  OCCUPATION: Retired oncology RN 2021; active with household chores, gardening, yard work    PATIENT GOALS:get the arm working like normal again   NEXT MD VISIT: 07/10/23  OBJECTIVE:   UPPER EXTREMITY ROM:    ROM Active Right 06/05/23 Passive Right 06/05/23  Shoulder flexion 127 151  Shoulder extension 40 60  Shoulder abduction 123 142  Shoulder adduction    Shoulder internal rotation Hand to waist   Shoulder external rotation 47 elbow at side  80 in scaption   Elbow flexion    Elbow extension    Wrist flexion    Wrist extension    Wrist ulnar deviation    Wrist radial deviation    Wrist pronation    Wrist supination    (Blank rows = not tested)  UPPER  EXTREMITY MMT:  MMT Right eval Left eval  Shoulder flexion    Shoulder extension    Shoulder abduction    Shoulder adduction    Shoulder internal rotation    Shoulder external rotation    Middle trapezius    Lower trapezius    Elbow flexion    Elbow extension    Wrist flexion    Wrist extension    Wrist ulnar deviation    Wrist radial deviation    Wrist pronation    Wrist supination    Grip strength (lbs)    (Blank rows = not tested)  PALPATION:  Muscular tightness Rt shoulder girdle  05/16/23: muscular tightness Rt lateral cervical musculature  06/05/23: muscular tightness Rt shoulder girdle   [facility] Adult PT Treatment:     (Dry needling)      DATE: 06/26/23 Therapeutic Exercise: UBE L5 x 4 min alt fwd/back Shoulder flexion sliding hands up wall Scap squeeze with noodle  Doorway stretch 2 positions 30 sec x 1(painful in higher position) Corner stretch 3 positions 30 sec x 2  T at wall R arm at 90 deg abd for biceps stretch 10 sec x 3  ER ball on wall  Wall angel x 10  Wall push up x 10  Manual Therapy: (patient supine) Skilled palpation to assess response to manual work and DN PROM in supine within tissue tolerance  PROM R biceps pt supine UE off edge of table for shoulder extension and elbow extension 20-30 sec x 3 reps  SMT Rt upper upper quarter Joint mobs inferior grade II/III  Trigger Point Dry-Needling  Treatment instructions: Expect mild to moderate muscle soreness. S/S of pneumothorax if dry needled over a lung field, and to seek immediate medical attention should they occur. Patient verbalized understanding of these instructions and education.  Patient Consent Given: Yes Education handout provided: Previously provided Muscles treated: R biceps to biceps tendon  Electrical stimulation performed: Yes Parameters: mAmp current; intensity to patient tolerance Treatment response/outcome: decreased palpable tightness and no aching per pt report      Kinesotape:  Trial of kinesotape R biceps ~ 70% stretch length of R biceps 1 strip  Modalities:  Vaso 34 deg; medium pressure x 10 min Lt shoulder   [facility] Adult PT Treatment:                                                DATE: 06/22/23 Therapeutic Exercise: UBE L4, 2 min fwd, 2 min bwd Bicep stretch against doorway thumb against door, palm flat against door 2x30 sec each Corner pec stretch mid and high x30 sec each Shoulder flexion sliding hands up wall Pool noodle against back Scap squeeze 2x10 "W" 2x10 Horizontal abd with elbows bent 2x10 (limited due L side flank pain) Manual Therapy: STM & TPR biceps, pecs Skilled palpation to assess response to manual work and DN Trigger Point Dry-Needling  Treatment instructions: Expect mild to moderate muscle soreness. S/S of pneumothorax if dry needled over a lung field, and to seek immediate medical attention should they occur. Patient verbalized understanding of these instructions and education.  Patient Consent Given: Yes Education handout provided: Previously provided Muscles treated: R biceps to biceps tendon  Electrical stimulation performed: Yes Parameters: mAmp current; intensity to patient tolerance Treatment response/outcome: decreased palpable tightness and no aching per pt report    Modalities: Vaso 34 deg; medium pressure x 10 min Lt shoulder    [facility] Adult PT Treatment:     (Dry needling)      DATE: 06/20/23 Therapeutic Exercise: UBE L4 x 4 min alt fwd/back Shoulder flexion sliding hands up wall Scap squeeze with noodle  Doorway stretch 2 positions 30 sec x 1(painful in higher position) Corner stretch 3 positions 30 sec x 2  T at wall R arm at 90 deg abd for biceps stretch 10 sec x 3  ER ball on wall  Wall angel x 10  Wall push up x 10  Manual Therapy: (patient supine) Skilled palpation to assess response to manual work and DN PROM in supine within tissue tolerance  PROM R biceps pt supine UE off edge of table for  shoulder extension and elbow extension 20-30 sec x 3 reps  SMT Rt upper upper quarter Joint mobs inferior grade II/III  Trigger Point Dry-Needling  Treatment instructions: Expect mild to moderate muscle soreness. S/S of pneumothorax if dry needled over a lung field, and to seek immediate medical attention should they occur. Patient verbalized understanding of these instructions and education.  Patient Consent Given: Yes Education handout provided: Previously provided Muscles treated: R biceps to biceps tendon  Electrical stimulation performed: Yes Parameters: mAmp current; intensity to patient tolerance Treatment response/outcome: decreased palpable tightness and no aching per pt report    Modalities:  Vaso 34 deg; medium pressure x 10 min Lt shoulder    PATIENT EDUCATION: Education details: POC; HEP  Person educated: Patient Education method: Programmer, multimedia, Demonstration, Actor cues, Verbal cues, and Handouts Education comprehension: verbalized understanding, returned demonstration, verbal cues required, tactile cues required, and needs further education  HOME EXERCISE PROGRAM: Access Code: [email or web address] URL: [email or web address] Date: 06/20/2023 Prepared by: Corlis Leak  Exercises - Flexion-Extension Shoulder Pendulum with Table Support  - 3-4 x daily - 7 x weekly - 1 sets - 20-30  reps - Horizontal Shoulder Pendulum with Table Support  - 3-4 x daily - 7 x weekly - 1 sets - 20-30 reps - Circular Shoulder Pendulum with Table Support  - 3-4 x daily - 7 x weekly - 1 sets - 20-30 reps - Standing Shoulder Flexion AAROM with Swiss Ball  - 2 x daily - 7 x weekly - 1 sets - 10 reps - 5-10 sec  hold - Seated Scapular Retraction  - 2 x daily - 7 x weekly - 1-2 sets - 10 reps - 10 sec  hold - Standing Scapular Retraction  - 3 x daily - 7 x weekly - 1 sets - 10 reps - 10 sec  hold - Seated Shoulder Flexion AAROM with Pulley Behind  - 2 x daily - 7 x weekly - 1 sets - 10 reps - 10  sec  hold - Seated Shoulder Scaption AAROM with Pulley at Side  - 2 x daily - 7 x weekly - 1 sets - 10 reps - 10sec  hold - Seated Shoulder External Rotation AAROM with Cane and Hand in Neutral  - 2 x daily - 7 x weekly - 1 sets - 5-10 reps - 5-10 sec  hold - Shoulder Alphabet with Ball at Counter  - 2 x daily - 7 x weekly - 1 sets - Prone Shoulder Extension - Single Arm  - 1 x daily - 7 x weekly - 1-2 sets - 10 reps - 3  sec  hold - Prone Shoulder Row  - 1 x daily - 7 x weekly - 1-2 sets - 10 reps - 3 sec  hold - Seated Single Arm Bicep Curls Supinated with Dumbbell  - 1 x daily - 7 x weekly - 1-2 sets - 10 reps - 1-2 sec  hold - Supine Elbow Flexion Extension AROM  - 2 x daily - 7 x weekly - 2-3 sets - 10 reps - 3 sec  hold - Standing Shoulder Row Reactive Isometric  - 2 x daily - 7 x weekly - 1 sets - 10 reps - 30-45 sec  hold - Shoulder External Rotation Reactive Isometrics  - 1 x daily - 7 x weekly - 1 sets - 10 reps - 3-5 sec  hold - Shoulder Internal Rotation Reactive Isometrics  - 2 x daily - 7 x weekly - 1 sets - 10 reps - 3-5 sec  hold - Standing Shoulder Flexion to 90 Degrees with Dumbbells  - 1 x daily - 7 x weekly - 1-2 sets - 10 reps - 2 sec  hold - Scaption with Dumbbells  - 1 x daily - 7 x weekly - 1-2 sets - 10 reps - 2 sec  hold - Sidelying Shoulder External Rotation AROM  - 1 x daily - 7 x weekly - 1-2 sets - 10 reps - 2-3 sec  hold - Bicep Stretch at Table  - 2 x daily - 7 x weekly - 1 sets - 3 reps - 30 sec  hold - Standing Shoulder Flexion Stretch on Wall  - 1 x daily - 7 x weekly - 2 sets - 10 reps - 3-5 sec  hold - Supine Shoulder Rhythmic Stabilization- Horizontal Abduction/Adduction  - 1 x daily - 7 x weekly - 1 sets - 5-10 reps - Supine Triceps Extension with Resistance  - 1 x daily - 7 x weekly - 1 sets - 10 reps - 3 sec  hold - Standing Bilateral Low Shoulder Row with  Anchored Resistance  - 2 x daily - 7 x weekly - 1-3 sets - 10 reps - 2-3 sec  hold - Shoulder External  Rotation with Anchored Resistance  - 2 x daily - 7 x weekly - 1-2 sets - 10 reps - 3 sec  hold - Shoulder Internal Rotation with Resistance  - 2 x daily - 7 x weekly - 2 sets - 10 reps - 3 sec  hold - Standing Bicep Stretch at Wall  - 2 x daily - 7 x weekly - 1 sets - 3 reps - 10-30 sec  hold - Wall Push Up  - 1 x daily - 7 x weekly - 1-3 sets - 10 reps - 3 sec  hold - Wall Angels  - 2 x daily - 7 x weekly - 1 sets - 10 reps - 2-3 sec  hold - Isometric Shoulder External Rotation in Abduction with Ball at Wall  - 1 x daily - 7 x weekly - 1-2 min hold   ASSESSMENT:  CLINICAL IMPRESSION: Patient continues to have pain in the R biceps area. The dry needling will help manage pain for 2-3 days but then the symptoms return. Continued with scapular and shoulder girdle/shoulder strengthening; pec and bicep stretches. Trial of kinesotaping for R biceps     RTD 07/10/23  OBJECTIVE IMPAIRMENTS: decreased activity tolerance, decreased mobility, decreased ROM, decreased strength, hypomobility, increased fascial restrictions, increased muscle spasms, impaired flexibility, impaired UE functional use, improper body mechanics, postural dysfunction, and pain.     GOALS: Goals reviewed with patient? Yes  SHORT TERM GOALS: Target date: 05/09/2023   Independent in initial HEP  Baseline: Goal status: met  2.  Patient independent in donning and doffing sling  Baseline:  Goal status: met   LONG TERM GOALS: Target date: 07/18/2023   Rt shoulder ROM WFL's  Baseline:  Goal status:met  2.  Rt shoulder strength 4+/5 to 5/5 throughout  Baseline:  Goal status: on going   3.  Improve posture and alignment with improved upright posture and posterior shoulder girdle engaged  Baseline:  Goal status: on going   4.  Patient reports decreased pain in Rt shoulder with functional activities to no more than 2/10  Baseline:  Goal status: partially met - pain in R biceps   5.  Independent in HEP including  aquatic program as indicated  Baseline:  Goal status: on going   PLAN:  PT FREQUENCY: 2x/week  PT DURATION: 4 weeks  PLANNED INTERVENTIONS: Therapeutic exercises, Therapeutic activity, Neuromuscular re-education, Patient/Family education, Self Care, Joint mobilization, Aquatic Therapy, Dry Needling, Electrical stimulation, Spinal mobilization, Cryotherapy, Moist heat, Taping, Vasopneumatic device, Ultrasound, Ionotophoresis 4mg /ml Dexamethasone, Manual therapy, and Re-evaluation  PLAN FOR NEXT SESSION: review and progress exercise; continue with postural correction and education; manual work,DN, modalities as indicated    [employer], PT 06/26/2023, 1:15 PM

## 2023-06-28 DIAGNOSIS — D2261 Melanocytic nevi of right upper limb, including shoulder: Secondary | ICD-10-CM | POA: Diagnosis not present

## 2023-06-28 DIAGNOSIS — D225 Melanocytic nevi of trunk: Secondary | ICD-10-CM | POA: Diagnosis not present

## 2023-06-28 DIAGNOSIS — L578 Other skin changes due to chronic exposure to nonionizing radiation: Secondary | ICD-10-CM | POA: Diagnosis not present

## 2023-06-28 DIAGNOSIS — L821 Other seborrheic keratosis: Secondary | ICD-10-CM | POA: Diagnosis not present

## 2023-06-28 DIAGNOSIS — Z85828 Personal history of other malignant neoplasm of skin: Secondary | ICD-10-CM | POA: Diagnosis not present

## 2023-06-28 DIAGNOSIS — L570 Actinic keratosis: Secondary | ICD-10-CM | POA: Diagnosis not present

## 2023-06-29 ENCOUNTER — Ambulatory Visit: Payer: Medicare HMO | Attending: Orthopedic Surgery | Admitting: Rehabilitative and Restorative Service Providers"

## 2023-06-29 ENCOUNTER — Encounter: Payer: Self-pay | Admitting: Rehabilitative and Restorative Service Providers"

## 2023-06-29 DIAGNOSIS — R29898 Other symptoms and signs involving the musculoskeletal system: Secondary | ICD-10-CM | POA: Insufficient documentation

## 2023-06-29 DIAGNOSIS — M6281 Muscle weakness (generalized): Secondary | ICD-10-CM | POA: Diagnosis not present

## 2023-06-29 DIAGNOSIS — M25511 Pain in right shoulder: Secondary | ICD-10-CM | POA: Diagnosis not present

## 2023-06-29 DIAGNOSIS — R293 Abnormal posture: Secondary | ICD-10-CM | POA: Insufficient documentation

## 2023-06-29 NOTE — Therapy (Signed)
OUTPATIENT PHYSICAL THERAPY SHOULDER TREATMENT    Patient Name: Mary Freeman MRN: [id number] DOB:June 10, 1951, 72 y.o., female Today's Date: 06/29/2023  END OF SESSION:  PT End of Session - 06/29/23 1402     Visit Number 26    Number of Visits 32    Date for PT Re-Evaluation 07/18/23    Authorization Type [employer] $20 copay    Authorization - Visit Number 30    Progress Note Due on Visit 30    PT Start Time 1400    PT Stop Time 1448    PT Time Calculation (min) 48 min    Activity Tolerance Patient tolerated treatment well              Past Medical History:  Diagnosis Date   Depression    Headache    migraines   PONV (postoperative nausea and vomiting)    Past Surgical History:  Procedure Laterality Date   APPENDECTOMY     BREAST EXCISIONAL BIOPSY Right    benign   COLONOSCOPY     KNEE ARTHROSCOPY Right    x3   KNEE ARTHROSCOPY W/ MENISCAL REPAIR Left    LUMBAR LAMINECTOMY  1986   REDUCTION MAMMAPLASTY Bilateral    2006   ROTATOR CUFF REPAIR Right 2012   TONSILLECTOMY     TUBAL LIGATION     Patient Active Problem List   Diagnosis Date Noted   Anxiety 02/11/2021   Adjustment disorder with depressed mood 02/11/2021   Chronic kidney disease, stage 3 unspecified (HCC) 02/11/2021   Collagenous colitis 02/11/2021   Gastroesophageal reflux disease 02/11/2021   Migraine without aura, not refractory 02/11/2021   Mild depression 02/11/2021   Osteopenia 02/11/2021   Early satiety 01/12/2021   Bloating 01/12/2021   Abdominal pain 01/12/2021    PCP: Dr Sigmund Hazel  REFERRING PROVIDER: Dr Francena Hanly   REFERRING DIAG: Rt shoulder torn RC  THERAPY DIAG:  Acute pain of right shoulder  Other symptoms and signs involving the musculoskeletal system  Abnormal posture  Muscle weakness (generalized)  Rationale for Evaluation and Treatment: Rehabilitation  ONSET DATE: 02/25/23; surgery 03/21/23  SUBJECTIVE:                                                                                                                                                                                       SUBJECTIVE STATEMENT: Patient states her arm feels much better. The tape came off pretty soon but the biceps is feeling better.feels the best she has felt since before surgery. Has some tightness especially reaching across her body.    Hand dominance: Right  PERTINENT HISTORY: Patient reports falling onto  Rt shoulder 02/25/23 sustaining torn RC and biceps. She underwent surgery 03/21/23 for [facility] repair and biceps tendon repair.   Lt shoulder scope RC, Lt 05/25/17 DCR/SAD/frayed RC/torn labrum; Rt shoulder pain; cervical pain 2018; LBP 2020; Rt knee pain 2022 - denies any medical problems   PAIN:  Are you having pain? Yes: NPRS scale: 0/10 Pain location: R biceps  Pain description: tightness; dull; aching; sore - seldom sharp Aggravating factors: moving  Relieving factors: meds; avoiding movement  PRECAUTIONS: Shoulder per protocol  WEIGHT BEARING RESTRICTIONS: No  FALLS:  Has patient fallen in last 6 months? No  LIVING ENVIRONMENT: Lives with: lives alone Lives in: House/apartment Stairs: Yes: Internal: 12 steps; on right going up and External: 3 steps; on right going up Has following equipment at home: Single point cane  OCCUPATION: Retired oncology RN 2021; active with household chores, gardening, yard work    PATIENT GOALS:get the arm working like normal again   NEXT MD VISIT: 07/10/23  OBJECTIVE:   UPPER EXTREMITY ROM:    ROM Active Right 06/05/23 Passive Right 06/05/23  Shoulder flexion 127 151  Shoulder extension 40 60  Shoulder abduction 123 142  Shoulder adduction    Shoulder internal rotation Hand to waist   Shoulder external rotation 47 elbow at side  80 in scaption   Elbow flexion    Elbow extension    Wrist flexion    Wrist extension    Wrist ulnar deviation    Wrist radial deviation    Wrist pronation    Wrist supination     (Blank rows = not tested)  UPPER EXTREMITY MMT:  MMT Right eval Left eval  Shoulder flexion    Shoulder extension    Shoulder abduction    Shoulder adduction    Shoulder internal rotation    Shoulder external rotation    Middle trapezius    Lower trapezius    Elbow flexion    Elbow extension    Wrist flexion    Wrist extension    Wrist ulnar deviation    Wrist radial deviation    Wrist pronation    Wrist supination    Grip strength (lbs)    (Blank rows = not tested)  PALPATION:  Muscular tightness Rt shoulder girdle  05/16/23: muscular tightness Rt lateral cervical musculature  06/05/23: muscular tightness Rt shoulder girdle  [facility] Adult PT Treatment:      DATE: 06/29/23 Therapeutic Exercise: UBE L5 x 4 min alt fwd/back Shoulder flexion sliding hands up wall Scap squeeze with noodle  Doorway stretch 3 positions 30 sec x 2  T on foam roll UE's 90 deg abd x ~ 2 min   Sleeper stretch R sidelying noodle under elbow to maintain position of GH joint Shoulder adduction stretch pulling R UE across body lower position 10 sec x 3, higher position 10 sec x 3  Wall angel x 10  Wall push up x 10  Manual Therapy: (patient supine) PROM in supine within tissue tolerance  PROM R biceps pt supine UE off edge of table for shoulder extension and elbow extension 20-30 sec x 3 reps  SMT Rt upper upper quarter Joint mobs inferior grade II/III   [facility] Adult PT Treatment:     (Dry needling)      DATE: 06/26/23 Therapeutic Exercise: UBE L5 x 4 min alt fwd/back Shoulder flexion sliding hands up wall Scap squeeze with noodle  Doorway stretch 2 positions 30 sec x 1(painful in higher position) Corner stretch 3  positions 30 sec x 2  T at wall R arm at 90 deg abd for biceps stretch 10 sec x 3  ER ball on wall  Wall angel x 10  Wall push up x 10  Manual Therapy: (patient supine) Skilled palpation to assess response to manual work and DN PROM in supine within tissue tolerance  PROM R biceps pt  supine UE off edge of table for shoulder extension and elbow extension 20-30 sec x 3 reps  SMT Rt upper upper quarter Joint mobs inferior grade II/III  Trigger Point Dry-Needling  Treatment instructions: Expect mild to moderate muscle soreness. S/S of pneumothorax if dry needled over a lung field, and to seek immediate medical attention should they occur. Patient verbalized understanding of these instructions and education.  Patient Consent Given: Yes Education handout provided: Previously provided Muscles treated: R biceps to biceps tendon  Electrical stimulation performed: Yes Parameters: mAmp current; intensity to patient tolerance Treatment response/outcome: decreased palpable tightness and no aching per pt report     Kinesotape:  Trial of kinesotape R biceps ~ 70% stretch length of R biceps 1 strip  Modalities:  Vaso 34 deg; medium pressure x 10 min Lt shoulder   PATIENT EDUCATION: Education details: POC; HEP  Person educated: Patient Education method: Programmer, multimedia, Facilities manager, Actor cues, Verbal cues, and Handouts Education comprehension: verbalized understanding, returned demonstration, verbal cues required, tactile cues required, and needs further education  HOME EXERCISE PROGRAM:  Access Code: [email or web address] URL: [email or web address] Date: 06/29/2023 Prepared by: Corlis Leak  Exercises - Flexion-Extension Shoulder Pendulum with Table Support  - 3-4 x daily - 7 x weekly - 1 sets - 20-30 reps - Horizontal Shoulder Pendulum with Table Support  - 3-4 x daily - 7 x weekly - 1 sets - 20-30 reps - Circular Shoulder Pendulum with Table Support  - 3-4 x daily - 7 x weekly - 1 sets - 20-30 reps - Standing Shoulder Flexion AAROM with Swiss Ball  - 2 x daily - 7 x weekly - 1 sets - 10 reps - 5-10 sec  hold - Seated Scapular Retraction  - 2 x daily - 7 x weekly - 1-2 sets - 10 reps - 10 sec  hold - Standing Scapular Retraction  - 3 x daily - 7 x weekly - 1 sets - 10 reps  - 10 sec  hold - Seated Shoulder Flexion AAROM with Pulley Behind  - 2 x daily - 7 x weekly - 1 sets - 10 reps - 10 sec  hold - Seated Shoulder Scaption AAROM with Pulley at Side  - 2 x daily - 7 x weekly - 1 sets - 10 reps - 10sec  hold - Seated Shoulder External Rotation AAROM with Cane and Hand in Neutral  - 2 x daily - 7 x weekly - 1 sets - 5-10 reps - 5-10 sec  hold - Shoulder Alphabet with Ball at Counter  - 2 x daily - 7 x weekly - 1 sets - Prone Shoulder Extension - Single Arm  - 1 x daily - 7 x weekly - 1-2 sets - 10 reps - 3  sec  hold - Prone Shoulder Row  - 1 x daily - 7 x weekly - 1-2 sets - 10 reps - 3 sec  hold - Seated Single Arm Bicep Curls Supinated with Dumbbell  - 1 x daily - 7 x weekly - 1-2 sets - 10 reps - 1-2 sec  hold - Supine Elbow Flexion Extension AROM  -  2 x daily - 7 x weekly - 2-3 sets - 10 reps - 3 sec  hold - Standing Shoulder Row Reactive Isometric  - 2 x daily - 7 x weekly - 1 sets - 10 reps - 30-45 sec  hold - Shoulder External Rotation Reactive Isometrics  - 1 x daily - 7 x weekly - 1 sets - 10 reps - 3-5 sec  hold - Shoulder Internal Rotation Reactive Isometrics  - 2 x daily - 7 x weekly - 1 sets - 10 reps - 3-5 sec  hold - Standing Shoulder Flexion to 90 Degrees with Dumbbells  - 1 x daily - 7 x weekly - 1-2 sets - 10 reps - 2 sec  hold - Scaption with Dumbbells  - 1 x daily - 7 x weekly - 1-2 sets - 10 reps - 2 sec  hold - Sidelying Shoulder External Rotation AROM  - 1 x daily - 7 x weekly - 1-2 sets - 10 reps - 2-3 sec  hold - Bicep Stretch at Table  - 2 x daily - 7 x weekly - 1 sets - 3 reps - 30 sec  hold - Standing Shoulder Flexion Stretch on Wall  - 1 x daily - 7 x weekly - 2 sets - 10 reps - 3-5 sec  hold - Supine Shoulder Rhythmic Stabilization- Horizontal Abduction/Adduction  - 1 x daily - 7 x weekly - 1 sets - 5-10 reps - Supine Triceps Extension with Resistance  - 1 x daily - 7 x weekly - 1 sets - 10 reps - 3 sec  hold - Standing Bilateral Low  Shoulder Row with Anchored Resistance  - 2 x daily - 7 x weekly - 1-3 sets - 10 reps - 2-3 sec  hold - Shoulder External Rotation with Anchored Resistance  - 2 x daily - 7 x weekly - 1-2 sets - 10 reps - 3 sec  hold - Shoulder Internal Rotation with Resistance  - 2 x daily - 7 x weekly - 2 sets - 10 reps - 3 sec  hold - Standing Bicep Stretch at Wall  - 2 x daily - 7 x weekly - 1 sets - 3 reps - 10-30 sec  hold - Wall Push Up  - 1 x daily - 7 x weekly - 1-3 sets - 10 reps - 3 sec  hold - Wall Angels  - 2 x daily - 7 x weekly - 1 sets - 10 reps - 2-3 sec  hold - Isometric Shoulder External Rotation in Abduction with Ball at Wall  - 1 x daily - 7 x weekly - 1-2 min hold - Sleeper Stretch  - 1 x daily - 7 x weekly - 1 sets - 3 reps - 30 sec  hold - Standing Shoulder Posterior Capsule Stretch  - 2 x daily - 7 x weekly - 1 sets - 3 reps - 30 sec  hold - Supine Chest Stretch on Foam Roll  - 2 x daily - 7 x weekly - 1 sets - 1 reps - 2-5 min  sec  hold  ASSESSMENT:  CLINICAL IMPRESSION: Improvement in biceps pain with patient reporting no biceps pain today. Continued with scapular and shoulder girdle/shoulder strengthening; pec and bicep stretches. Added stretches for cross body reach.   RTD 07/10/23  OBJECTIVE IMPAIRMENTS: decreased activity tolerance, decreased mobility, decreased ROM, decreased strength, hypomobility, increased fascial restrictions, increased muscle spasms, impaired flexibility, impaired UE functional use, improper body mechanics, postural dysfunction,  and pain.     GOALS: Goals reviewed with patient? Yes  SHORT TERM GOALS: Target date: 05/09/2023   Independent in initial HEP  Baseline: Goal status: met  2.  Patient independent in donning and doffing sling  Baseline:  Goal status: met   LONG TERM GOALS: Target date: 07/18/2023   Rt shoulder ROM WFL's  Baseline:  Goal status:met  2.  Rt shoulder strength 4+/5 to 5/5 throughout  Baseline:  Goal status: on going    3.  Improve posture and alignment with improved upright posture and posterior shoulder girdle engaged  Baseline:  Goal status: on going   4.  Patient reports decreased pain in Rt shoulder with functional activities to no more than 2/10  Baseline:  Goal status: partially met - pain in R biceps   5.  Independent in HEP including aquatic program as indicated  Baseline:  Goal status: on going   PLAN:  PT FREQUENCY: 2x/week  PT DURATION: 4 weeks  PLANNED INTERVENTIONS: Therapeutic exercises, Therapeutic activity, Neuromuscular re-education, Patient/Family education, Self Care, Joint mobilization, Aquatic Therapy, Dry Needling, Electrical stimulation, Spinal mobilization, Cryotherapy, Moist heat, Taping, Vasopneumatic device, Ultrasound, Ionotophoresis 4mg /ml Dexamethasone, Manual therapy, and Re-evaluation  PLAN FOR NEXT SESSION: review and progress exercise; continue with postural correction and education; manual work,DN, modalities as indicated    [employer], PT 06/29/2023, 2:03 PM

## 2023-07-03 ENCOUNTER — Encounter: Payer: Self-pay | Admitting: Rehabilitative and Restorative Service Providers"

## 2023-07-03 ENCOUNTER — Ambulatory Visit: Payer: Medicare HMO | Admitting: Rehabilitative and Restorative Service Providers"

## 2023-07-03 DIAGNOSIS — M6281 Muscle weakness (generalized): Secondary | ICD-10-CM

## 2023-07-03 DIAGNOSIS — M25511 Pain in right shoulder: Secondary | ICD-10-CM

## 2023-07-03 DIAGNOSIS — R293 Abnormal posture: Secondary | ICD-10-CM | POA: Diagnosis not present

## 2023-07-03 DIAGNOSIS — R29898 Other symptoms and signs involving the musculoskeletal system: Secondary | ICD-10-CM | POA: Diagnosis not present

## 2023-07-03 NOTE — Therapy (Signed)
OUTPATIENT PHYSICAL THERAPY SHOULDER TREATMENT    Patient Name: Mary Freeman MRN: [id number] DOB:August 12, 1951, 72 y.o., female Today's Date: 07/03/2023  END OF SESSION:  PT End of Session - 07/03/23 1400     Visit Number 27    Number of Visits 32    Date for PT Re-Evaluation 07/18/23    Authorization Type [employer] $20 copay    Authorization - Visit Number 27    Progress Note Due on Visit 30    PT Start Time 1400    PT Stop Time 1445    PT Time Calculation (min) 45 min    Activity Tolerance Patient tolerated treatment well              Past Medical History:  Diagnosis Date   Depression    Headache    migraines   PONV (postoperative nausea and vomiting)    Past Surgical History:  Procedure Laterality Date   APPENDECTOMY     BREAST EXCISIONAL BIOPSY Right    benign   COLONOSCOPY     KNEE ARTHROSCOPY Right    x3   KNEE ARTHROSCOPY W/ MENISCAL REPAIR Left    LUMBAR LAMINECTOMY  1986   REDUCTION MAMMAPLASTY Bilateral    2006   ROTATOR CUFF REPAIR Right 2012   TONSILLECTOMY     TUBAL LIGATION     Patient Active Problem List   Diagnosis Date Noted   Anxiety 02/11/2021   Adjustment disorder with depressed mood 02/11/2021   Chronic kidney disease, stage 3 unspecified (HCC) 02/11/2021   Collagenous colitis 02/11/2021   Gastroesophageal reflux disease 02/11/2021   Migraine without aura, not refractory 02/11/2021   Mild depression 02/11/2021   Osteopenia 02/11/2021   Early satiety 01/12/2021   Bloating 01/12/2021   Abdominal pain 01/12/2021    PCP: Dr Sigmund Hazel  REFERRING PROVIDER: Dr Francena Hanly   REFERRING DIAG: Rt shoulder torn RC  THERAPY DIAG:  Acute pain of right shoulder  Other symptoms and signs involving the musculoskeletal system  Abnormal posture  Muscle weakness (generalized)  Rationale for Evaluation and Treatment: Rehabilitation  ONSET DATE: 02/25/23; surgery 03/21/23  SUBJECTIVE:                                                                                                                                                                                       SUBJECTIVE STATEMENT: Patient states her arm continues to feels much better.she is working on exercises consistently at home. Increasing her activities of daily living.   Hand dominance: Right  PERTINENT HISTORY: Patient reports falling onto Rt shoulder 02/25/23 sustaining torn RC and biceps. She underwent surgery 03/21/23 for [facility]  repair and biceps tendon repair.   Lt shoulder scope RC, Lt 05/25/17 DCR/SAD/frayed RC/torn labrum; Rt shoulder pain; cervical pain 2018; LBP 2020; Rt knee pain 2022 - denies any medical problems   PAIN:  Are you having pain? Yes: NPRS scale: 0/10 Pain location: R biceps  Pain description: tightness; dull; aching; sore - seldom sharp Aggravating factors: moving  Relieving factors: meds; avoiding movement  PRECAUTIONS: Shoulder per protocol  WEIGHT BEARING RESTRICTIONS: No  FALLS:  Has patient fallen in last 6 months? No  LIVING ENVIRONMENT: Lives with: lives alone Lives in: House/apartment Stairs: Yes: Internal: 12 steps; on right going up and External: 3 steps; on right going up Has following equipment at home: Single point cane  OCCUPATION: Retired oncology RN 2021; active with household chores, gardening, yard work    PATIENT GOALS:get the arm working like normal again   NEXT MD VISIT: 07/10/23  OBJECTIVE:   UPPER EXTREMITY ROM:    ROM Active Right 06/05/23 Passive Right 06/05/23  Shoulder flexion 127 151  Shoulder extension 40 60  Shoulder abduction 123 142  Shoulder adduction    Shoulder internal rotation Hand to waist   Shoulder external rotation 47 elbow at side  80 in scaption   Elbow flexion    Elbow extension    Wrist flexion    Wrist extension    Wrist ulnar deviation    Wrist radial deviation    Wrist pronation    Wrist supination    (Blank rows = not tested)  UPPER EXTREMITY MMT:  MMT  Right eval Left eval  Shoulder flexion    Shoulder extension    Shoulder abduction    Shoulder adduction    Shoulder internal rotation    Shoulder external rotation    Middle trapezius    Lower trapezius    Elbow flexion    Elbow extension    Wrist flexion    Wrist extension    Wrist ulnar deviation    Wrist radial deviation    Wrist pronation    Wrist supination    Grip strength (lbs)    (Blank rows = not tested)  PALPATION:  Muscular tightness Rt shoulder girdle  05/16/23: muscular tightness Rt lateral cervical musculature  06/05/23: muscular tightness Rt shoulder girdle   [facility] Adult PT Treatment:      DATE: 07/03/23 Therapeutic Exercise: UBE L6 x 4 min alt fwd/back Shoulder flexion sliding hands up wall Scap squeeze with noodle  Doorway stretch 3 positions 30 sec x 2  T on foam roll UE's 90 deg abd x ~ 2 min   Sleeper stretch R sidelying noodle under elbow to maintain position of GH joint IR stretch with strap 10 sec x 3  Shoulder adduction stretch pulling R UE across body lower position 10 sec x 3, higher position 10 sec x 3  Wall angel x 10  Wall push up x 10  Biceps curl 3# R/L x 10 x 2 Shoulder flexion 2# R/L 10 x 2 Shoulder scaption 2# R/L 10 x 2  Manual Therapy: (patient supine) Skilled palpation to assess response to manual work and DN PROM in supine within tissue tolerance  PROM R biceps pt supine UE off edge of table for shoulder extension and elbow extension 20-30 sec x 3 reps  SMT Rt upper upper quarter Joint mobs inferior grade II/III  Trigger Point Dry-Needling  Treatment instructions: Expect mild to moderate muscle soreness. S/S of pneumothorax if dry needled over a lung field, and  to seek immediate medical attention should they occur. Patient verbalized understanding of these instructions and education.  Patient Consent Given: Yes Education handout provided: Previously provided Muscles treated: R teres/lats  Electrical stimulation performed:  no Parameters: mAmp current; intensity to patient tolerance Treatment response/outcome: decreased palpable tightness       [facility] Adult PT Treatment:      DATE: 06/29/23 Therapeutic Exercise: UBE L5 x 4 min alt fwd/back Shoulder flexion sliding hands up wall Scap squeeze with noodle  Doorway stretch 3 positions 30 sec x 2  T on foam roll UE's 90 deg abd x ~ 2 min   Sleeper stretch R sidelying noodle under elbow to maintain position of GH joint Shoulder adduction stretch pulling R UE across body lower position 10 sec x 3, higher position 10 sec x 3  Wall angel x 10  Wall push up x 10  Manual Therapy: (patient supine) PROM in supine within tissue tolerance  PROM R biceps pt supine UE off edge of table for shoulder extension and elbow extension 20-30 sec x 3 reps  SMT Rt upper upper quarter Joint mobs inferior grade II/III    PATIENT EDUCATION: Education details: POC; HEP  Person educated: Patient Education method: Programmer, multimedia, Demonstration, Actor cues, Verbal cues, and Handouts Education comprehension: verbalized understanding, returned demonstration, verbal cues required, tactile cues required, and needs further education  HOME EXERCISE PROGRAM:  Access Code: [email or web address] URL: [email or web address] Date: 07/03/2023 Prepared by: Corlis Leak  Exercises - Flexion-Extension Shoulder Pendulum with Table Support  - 3-4 x daily - 7 x weekly - 1 sets - 20-30 reps - Horizontal Shoulder Pendulum with Table Support  - 3-4 x daily - 7 x weekly - 1 sets - 20-30 reps - Circular Shoulder Pendulum with Table Support  - 3-4 x daily - 7 x weekly - 1 sets - 20-30 reps - Standing Shoulder Flexion AAROM with Swiss Ball  - 2 x daily - 7 x weekly - 1 sets - 10 reps - 5-10 sec  hold - Seated Scapular Retraction  - 2 x daily - 7 x weekly - 1-2 sets - 10 reps - 10 sec  hold - Standing Scapular Retraction  - 3 x daily - 7 x weekly - 1 sets - 10 reps - 10 sec  hold - Seated Shoulder Flexion  AAROM with Pulley Behind  - 2 x daily - 7 x weekly - 1 sets - 10 reps - 10 sec  hold - Seated Shoulder Scaption AAROM with Pulley at Side  - 2 x daily - 7 x weekly - 1 sets - 10 reps - 10sec  hold - Seated Shoulder External Rotation AAROM with Cane and Hand in Neutral  - 2 x daily - 7 x weekly - 1 sets - 5-10 reps - 5-10 sec  hold - Shoulder Alphabet with Ball at Counter  - 2 x daily - 7 x weekly - 1 sets - Prone Shoulder Extension - Single Arm  - 1 x daily - 7 x weekly - 1-2 sets - 10 reps - 3  sec  hold - Prone Shoulder Row  - 1 x daily - 7 x weekly - 1-2 sets - 10 reps - 3 sec  hold - Seated Single Arm Bicep Curls Supinated with Dumbbell  - 1 x daily - 7 x weekly - 1-2 sets - 10 reps - 1-2 sec  hold - Supine Elbow Flexion Extension AROM  - 2 x daily - 7 x weekly -  2-3 sets - 10 reps - 3 sec  hold - Standing Shoulder Row Reactive Isometric  - 2 x daily - 7 x weekly - 1 sets - 10 reps - 30-45 sec  hold - Shoulder External Rotation Reactive Isometrics  - 1 x daily - 7 x weekly - 1 sets - 10 reps - 3-5 sec  hold - Shoulder Internal Rotation Reactive Isometrics  - 2 x daily - 7 x weekly - 1 sets - 10 reps - 3-5 sec  hold - Standing Shoulder Flexion to 90 Degrees with Dumbbells  - 1 x daily - 7 x weekly - 1-2 sets - 10 reps - 2 sec  hold - Scaption with Dumbbells  - 1 x daily - 7 x weekly - 1-2 sets - 10 reps - 2 sec  hold - Sidelying Shoulder External Rotation AROM  - 1 x daily - 7 x weekly - 1-2 sets - 10 reps - 2-3 sec  hold - Bicep Stretch at Table  - 2 x daily - 7 x weekly - 1 sets - 3 reps - 30 sec  hold - Standing Shoulder Flexion Stretch on Wall  - 1 x daily - 7 x weekly - 2 sets - 10 reps - 3-5 sec  hold - Supine Shoulder Rhythmic Stabilization- Horizontal Abduction/Adduction  - 1 x daily - 7 x weekly - 1 sets - 5-10 reps - Supine Triceps Extension with Resistance  - 1 x daily - 7 x weekly - 1 sets - 10 reps - 3 sec  hold - Standing Bilateral Low Shoulder Row with Anchored Resistance  - 2 x  daily - 7 x weekly - 1-3 sets - 10 reps - 2-3 sec  hold - Shoulder External Rotation with Anchored Resistance  - 2 x daily - 7 x weekly - 1-2 sets - 10 reps - 3 sec  hold - Shoulder Internal Rotation with Resistance  - 2 x daily - 7 x weekly - 2 sets - 10 reps - 3 sec  hold - Standing Bicep Stretch at Wall  - 2 x daily - 7 x weekly - 1 sets - 3 reps - 10-30 sec  hold - Wall Push Up  - 1 x daily - 7 x weekly - 1-3 sets - 10 reps - 3 sec  hold - Wall Angels  - 2 x daily - 7 x weekly - 1 sets - 10 reps - 2-3 sec  hold - Isometric Shoulder External Rotation in Abduction with Ball at Wall  - 1 x daily - 7 x weekly - 1-2 min hold - Sleeper Stretch  - 1 x daily - 7 x weekly - 1 sets - 3 reps - 30 sec  hold - Standing Shoulder Posterior Capsule Stretch  - 2 x daily - 7 x weekly - 1 sets - 3 reps - 30 sec  hold - Supine Chest Stretch on Foam Roll  - 2 x daily - 7 x weekly - 1 sets - 1 reps - 2-5 min  sec  hold - Standing Shoulder Internal Rotation Stretch with Towel  - 2 x daily - 7 x weekly - 1 sets - 3-5 reps - 10-15 sec  hold  ASSESSMENT:  CLINICAL IMPRESSION: Continued improvement in biceps pain with patient reporting no biceps pain today. Reviewed exercises for home program. Continued with scapular and shoulder girdle/shoulder strengthening; pec and bicep stretches. Anticipate d/c at next visit. Note to MD   RTD 07/10/23  OBJECTIVE  IMPAIRMENTS: decreased activity tolerance, decreased mobility, decreased ROM, decreased strength, hypomobility, increased fascial restrictions, increased muscle spasms, impaired flexibility, impaired UE functional use, improper body mechanics, postural dysfunction, and pain.     GOALS: Goals reviewed with patient? Yes  SHORT TERM GOALS: Target date: 05/09/2023   Independent in initial HEP  Baseline: Goal status: met  2.  Patient independent in donning and doffing sling  Baseline:  Goal status: met   LONG TERM GOALS: Target date: 07/18/2023   Rt shoulder  ROM WFL's  Baseline:  Goal status:met  2.  Rt shoulder strength 4+/5 to 5/5 throughout  Baseline:  Goal status: on going   3.  Improve posture and alignment with improved upright posture and posterior shoulder girdle engaged  Baseline:  Goal status: on going   4.  Patient reports decreased pain in Rt shoulder with functional activities to no more than 2/10  Baseline:  Goal status: met   5.  Independent in HEP including aquatic program as indicated  Baseline:  Goal status: on going   PLAN:  PT FREQUENCY: 2x/week  PT DURATION: 4 weeks  PLANNED INTERVENTIONS: Therapeutic exercises, Therapeutic activity, Neuromuscular re-education, Patient/Family education, Self Care, Joint mobilization, Aquatic Therapy, Dry Needling, Electrical stimulation, Spinal mobilization, Cryotherapy, Moist heat, Taping, Vasopneumatic device, Ultrasound, Ionotophoresis 4mg /ml Dexamethasone, Manual therapy, and Re-evaluation  PLAN FOR NEXT SESSION: review and progress exercise; continue with postural correction and education; manual work,DN, modalities as indicated    [employer], PT 07/03/2023, 2:01 PM

## 2023-07-06 ENCOUNTER — Ambulatory Visit: Payer: Medicare HMO | Admitting: Rehabilitative and Restorative Service Providers"

## 2023-07-06 ENCOUNTER — Encounter: Payer: Self-pay | Admitting: Rehabilitative and Restorative Service Providers"

## 2023-07-06 DIAGNOSIS — R29898 Other symptoms and signs involving the musculoskeletal system: Secondary | ICD-10-CM | POA: Diagnosis not present

## 2023-07-06 DIAGNOSIS — M25511 Pain in right shoulder: Secondary | ICD-10-CM | POA: Diagnosis not present

## 2023-07-06 DIAGNOSIS — R293 Abnormal posture: Secondary | ICD-10-CM | POA: Diagnosis not present

## 2023-07-06 DIAGNOSIS — M6281 Muscle weakness (generalized): Secondary | ICD-10-CM | POA: Diagnosis not present

## 2023-07-06 NOTE — Therapy (Addendum)
 OUTPATIENT PHYSICAL THERAPY SHOULDER TREATMENT  PHYSICAL THERAPY DISCHARGE SUMMARY  Visits from Start of Care: 28  Current functional level related to goals / functional outcomes: See progress note for discharge status    Remaining deficits: None- patient needs to continue with exercises    Education / Equipment: HEP   Patient agrees to discharge. Patient goals were met. Patient is being discharged due to meeting the stated rehab goals.  Elodie Panameno P. Leonor Liv PT, MPH 02/06/24 8:13 AM     Patient Name: Mary Freeman MRN: [id number] DOB:[birth year], 72 y.o., female Today's Date: 07/06/2023  END OF SESSION:  PT End of Session - 07/06/23 1401     Visit Number 28    Number of Visits 32    Date for PT Re-Evaluation 07/18/23    Authorization Type [employer] medicare $20 copay    Authorization - Visit Number 28    Progress Note Due on Visit 30    PT Start Time 1400    PT Stop Time 1448    PT Time Calculation (min) 48 min    Activity Tolerance Patient tolerated treatment well              Past Medical History:  Diagnosis Date   Depression    Headache    migraines   PONV (postoperative nausea and vomiting)    Past Surgical History:  Procedure Laterality Date   APPENDECTOMY     BREAST EXCISIONAL BIOPSY Right    benign   COLONOSCOPY     KNEE ARTHROSCOPY Right    x3   KNEE ARTHROSCOPY W/ MENISCAL REPAIR Left    LUMBAR LAMINECTOMY  1986   REDUCTION MAMMAPLASTY Bilateral    2006   ROTATOR CUFF REPAIR Right 2012   TONSILLECTOMY     TUBAL LIGATION     Patient Active Problem List   Diagnosis Date Noted   Anxiety 02/11/2021   Adjustment disorder with depressed mood 02/11/2021   Chronic kidney disease, stage 3 unspecified (HCC) 02/11/2021   Collagenous colitis 02/11/2021   Gastroesophageal reflux disease 02/11/2021   Migraine without aura, not refractory 02/11/2021   Mild depression 02/11/2021   Osteopenia 02/11/2021   Early satiety 01/12/2021   Bloating 01/12/2021    Abdominal pain 01/12/2021    PCP: Dr Sigmund Hazel  REFERRING PROVIDER: Dr Francena Hanly   REFERRING DIAG: Rt shoulder torn RC  THERAPY DIAG:  Acute pain of right shoulder  Other symptoms and signs involving the musculoskeletal system  Abnormal posture  Muscle weakness (generalized)  Rationale for Evaluation and Treatment: Rehabilitation  ONSET DATE: 02/25/23; surgery 03/21/23  SUBJECTIVE:  SUBJECTIVE STATEMENT: Patient states her arm continues to feels much better.she is working on exercises consistently at home. Increasing her activities of daily living. Does have a little aching in the biceps area. Sees MD next week.   Hand dominance: Right  PERTINENT HISTORY: Patient reports falling onto Rt shoulder 02/25/23 sustaining torn RC and biceps. She underwent surgery 03/21/23 for [facility] repair and biceps tendon repair.   Lt shoulder scope RC, Lt 05/25/17 DCR/SAD/frayed RC/torn labrum; Rt shoulder pain; cervical pain 2018; LBP 2020; Rt knee pain 2022 - denies any medical problems   PAIN:  Are you having pain? Yes: NPRS scale: 0/10 Pain location: R biceps  Pain description: tightness; dull; aching; sore - seldom sharp Aggravating factors: moving  Relieving factors: meds; avoiding movement  PRECAUTIONS: Shoulder per protocol  WEIGHT BEARING RESTRICTIONS: No  FALLS:  Has patient fallen in last 6 months? No  LIVING ENVIRONMENT: Lives with: lives alone Lives in: House/apartment Stairs: Yes: Internal: 12 steps; on right going up and External: 3 steps; on right going up Has following equipment at home: Single point cane  OCCUPATION: Retired oncology RN 2021; active with household chores, gardening, yard work    PATIENT GOALS:get the arm working like normal again   NEXT MD VISIT: 07/10/23  OBJECTIVE:    UPPER EXTREMITY ROM:    ROM Active Right 06/05/23 Passive Right 06/05/23 Active Right  07/06/23  Shoulder flexion 127 151 150  Shoulder extension 40 60 63  Shoulder abduction 123 142 163  Shoulder adduction     Shoulder internal rotation Hand to waist  Thumb to T9  Shoulder external rotation 47 elbow at side  80 in scaption  60 elbow at side   Elbow flexion     Elbow extension     Wrist flexion     Wrist extension     Wrist ulnar deviation     Wrist radial deviation     Wrist pronation     Wrist supination     (Blank rows = not tested)  UPPER EXTREMITY MMT:  MMT Right eval Left eval  Shoulder flexion 4+ 5  Shoulder extension 5 5  Shoulder abduction 5 5  Shoulder adduction    Shoulder internal rotation 5 5  Shoulder external rotation 5 5  Middle trapezius 4+ 5  Lower trapezius 4 5  Elbow flexion    Elbow extension    Wrist flexion    Wrist extension    Wrist ulnar deviation    Wrist radial deviation    Wrist pronation    Wrist supination    Grip strength (lbs)    (Blank rows = not tested)  PALPATION:  Muscular tightness Rt shoulder girdle  05/16/23: muscular tightness Rt lateral cervical musculature  06/05/23: muscular tightness Rt shoulder girdle   [facility] Adult PT Treatment:      DATE: 07/06/23 Therapeutic Exercise: UBE L6 x 4 min alt fwd/back Shoulder flexion back at wall 3# DB x 10  Shoulder scaption back at 3# R/L 10  Shoulder flexion facing wall sliding hands up wall lifting hands off wall 3 sec x 10  Doorway stretch 3 positions 30 sec x 2  T on foam roll UE's 90 deg abd x ~ 2 min   IR stretch with strap 10 sec x 3  Shoulder adduction stretch pulling R UE across body lower position 10 sec x 3, higher position 10 sec x 3  Shoulder stretch in doorway 30 sec x 2  Wall angel x 10  Wall push up x 10  Biceps curl 3# R/L x 10 x 2  Manual Therapy: (patient supine) Skilled palpation to assess response to manual work and DN PROM in supine within tissue tolerance   PROM R biceps pt supine UE off edge of table for shoulder extension and elbow extension 20-30 sec x 3 reps  SMT Rt upper upper quarter Joint mobs inferior grade II/III  Trigger Point Dry-Needling  Treatment instructions: Expect mild to moderate muscle soreness. S/S of pneumothorax if dry needled over a lung field, and to seek immediate medical attention should they occur. Patient verbalized understanding of these instructions and education.  Patient Consent Given: Yes Education handout provided: Previously provided Muscles treated: R teres/lats  Electrical stimulation performed: no Parameters: mAmp current; intensity to patient tolerance Treatment response/outcome: decreased palpable tightness       [facility] Adult PT Treatment:      DATE: 07/03/23 Therapeutic Exercise: UBE L6 x 4 min alt fwd/back Shoulder flexion sliding hands up wall Scap squeeze with noodle  Doorway stretch 3 positions 30 sec x 2  T on foam roll UE's 90 deg abd x ~ 2 min   Sleeper stretch R sidelying noodle under elbow to maintain position of GH joint IR stretch with strap 10 sec x 3  Shoulder adduction stretch pulling R UE across body lower position 10 sec x 3, higher position 10 sec x 3  Wall angel x 10  Wall push up x 10  Biceps curl 3# R/L x 10 x 2 Shoulder flexion 2# R/L 10 x 2 Shoulder scaption 2# R/L 10 x 2  Manual Therapy: (patient supine) Skilled palpation to assess response to manual work and DN PROM in supine within tissue tolerance  PROM R biceps pt supine UE off edge of table for shoulder extension and elbow extension 20-30 sec x 3 reps  SMT Rt upper upper quarter Joint mobs inferior grade II/III  Trigger Point Dry-Needling  Treatment instructions: Expect mild to moderate muscle soreness. S/S of pneumothorax if dry needled over a lung field, and to seek immediate medical attention should they occur. Patient verbalized understanding of these instructions and education.  Patient Consent Given:  Yes Education handout provided: Previously provided Muscles treated: R teres/lats  Electrical stimulation performed: no Parameters: mAmp current; intensity to patient tolerance Treatment response/outcome: decreased palpable tightness       PATIENT EDUCATION: Education details: POC; HEP  Person educated: Patient Education method: Explanation, Demonstration, Tactile cues, Verbal cues, and Handouts Education comprehension: verbalized understanding, returned demonstration, verbal cues required, tactile cues required, and needs further education  HOME EXERCISE PROGRAM:  Access Code: [email or web address] URL: [email or web address] Date: 07/03/2023 Prepared by: Corlis Leak  Exercises - Flexion-Extension Shoulder Pendulum with Table Support  - 3-4 x daily - 7 x weekly - 1 sets - 20-30 reps - Horizontal Shoulder Pendulum with Table Support  - 3-4 x daily - 7 x weekly - 1 sets - 20-30 reps - Circular Shoulder Pendulum with Table Support  - 3-4 x daily - 7 x weekly - 1 sets - 20-30 reps - Standing Shoulder Flexion AAROM with Swiss Ball  - 2 x daily - 7 x weekly - 1 sets - 10 reps - 5-10 sec  hold - Seated Scapular Retraction  - 2 x daily - 7 x weekly - 1-2 sets - 10 reps - 10 sec  hold - Standing Scapular Retraction  - 3 x daily - 7 x weekly - 1 sets - 10 reps -  10 sec  hold - Seated Shoulder Flexion AAROM with Pulley Behind  - 2 x daily - 7 x weekly - 1 sets - 10 reps - 10 sec  hold - Seated Shoulder Scaption AAROM with Pulley at Side  - 2 x daily - 7 x weekly - 1 sets - 10 reps - 10sec  hold - Seated Shoulder External Rotation AAROM with Cane and Hand in Neutral  - 2 x daily - 7 x weekly - 1 sets - 5-10 reps - 5-10 sec  hold - Shoulder Alphabet with Ball at Counter  - 2 x daily - 7 x weekly - 1 sets - Prone Shoulder Extension - Single Arm  - 1 x daily - 7 x weekly - 1-2 sets - 10 reps - 3  sec  hold - Prone Shoulder Row  - 1 x daily - 7 x weekly - 1-2 sets - 10 reps - 3 sec  hold - Seated  Single Arm Bicep Curls Supinated with Dumbbell  - 1 x daily - 7 x weekly - 1-2 sets - 10 reps - 1-2 sec  hold - Supine Elbow Flexion Extension AROM  - 2 x daily - 7 x weekly - 2-3 sets - 10 reps - 3 sec  hold - Standing Shoulder Row Reactive Isometric  - 2 x daily - 7 x weekly - 1 sets - 10 reps - 30-45 sec  hold - Shoulder External Rotation Reactive Isometrics  - 1 x daily - 7 x weekly - 1 sets - 10 reps - 3-5 sec  hold - Shoulder Internal Rotation Reactive Isometrics  - 2 x daily - 7 x weekly - 1 sets - 10 reps - 3-5 sec  hold - Standing Shoulder Flexion to 90 Degrees with Dumbbells  - 1 x daily - 7 x weekly - 1-2 sets - 10 reps - 2 sec  hold - Scaption with Dumbbells  - 1 x daily - 7 x weekly - 1-2 sets - 10 reps - 2 sec  hold - Sidelying Shoulder External Rotation AROM  - 1 x daily - 7 x weekly - 1-2 sets - 10 reps - 2-3 sec  hold - Bicep Stretch at Table  - 2 x daily - 7 x weekly - 1 sets - 3 reps - 30 sec  hold - Standing Shoulder Flexion Stretch on Wall  - 1 x daily - 7 x weekly - 2 sets - 10 reps - 3-5 sec  hold - Supine Shoulder Rhythmic Stabilization- Horizontal Abduction/Adduction  - 1 x daily - 7 x weekly - 1 sets - 5-10 reps - Supine Triceps Extension with Resistance  - 1 x daily - 7 x weekly - 1 sets - 10 reps - 3 sec  hold - Standing Bilateral Low Shoulder Row with Anchored Resistance  - 2 x daily - 7 x weekly - 1-3 sets - 10 reps - 2-3 sec  hold - Shoulder External Rotation with Anchored Resistance  - 2 x daily - 7 x weekly - 1-2 sets - 10 reps - 3 sec  hold - Shoulder Internal Rotation with Resistance  - 2 x daily - 7 x weekly - 2 sets - 10 reps - 3 sec  hold - Standing Bicep Stretch at Wall  - 2 x daily - 7 x weekly - 1 sets - 3 reps - 10-30 sec  hold - Wall Push Up  - 1 x daily - 7 x weekly - 1-3 sets -  10 reps - 3 sec  hold - Wall Angels  - 2 x daily - 7 x weekly - 1 sets - 10 reps - 2-3 sec  hold - Isometric Shoulder External Rotation in Abduction with Ball at Wall  - 1 x daily  - 7 x weekly - 1-2 min hold - Sleeper Stretch  - 1 x daily - 7 x weekly - 1 sets - 3 reps - 30 sec  hold - Standing Shoulder Posterior Capsule Stretch  - 2 x daily - 7 x weekly - 1 sets - 3 reps - 30 sec  hold - Supine Chest Stretch on Foam Roll  - 2 x daily - 7 x weekly - 1 sets - 1 reps - 2-5 min  sec  hold - Standing Shoulder Internal Rotation Stretch with Towel  - 2 x daily - 7 x weekly - 1 sets - 3-5 reps - 10-15 sec  hold  ASSESSMENT:  CLINICAL IMPRESSION: Excellent progress with rehab. Reviewed exercises for home program. Continued with scapular and shoulder girdle/shoulder strengthening; pec and bicep stretches. Goals of therapy accomplished. Note to MD   RTD 07/10/23  OBJECTIVE IMPAIRMENTS: decreased activity tolerance, decreased mobility, decreased ROM, decreased strength, hypomobility, increased fascial restrictions, increased muscle spasms, impaired flexibility, impaired UE functional use, improper body mechanics, postural dysfunction, and pain.     GOALS: Goals reviewed with patient? Yes  SHORT TERM GOALS: Target date: 05/09/2023   Independent in initial HEP  Baseline: Goal status: met  2.  Patient independent in donning and doffing sling  Baseline:  Goal status: met   LONG TERM GOALS: Target date: 07/18/2023   Rt shoulder ROM WFL's  Baseline:  Goal status:met  2.  Rt shoulder strength 4+/5 to 5/5 throughout  Baseline:  Goal status: met   3.  Improve posture and alignment with improved upright posture and posterior shoulder girdle engaged  Baseline:  Goal status: met   4.  Patient reports decreased pain in Rt shoulder with functional activities to no more than 2/10  Baseline:  Goal status: met   5.  Independent in HEP including aquatic program as indicated  Baseline:  Goal status: met  PLAN:  PT FREQUENCY: 2x/week  PT DURATION: 4 weeks  PLANNED INTERVENTIONS: Therapeutic exercises, Therapeutic activity, Neuromuscular re-education, Patient/Family  education, Self Care, Joint mobilization, Aquatic Therapy, Dry Needling, Electrical stimulation, Spinal mobilization, Cryotherapy, Moist heat, Taping, Vasopneumatic device, Ultrasound, Ionotophoresis 4mg /ml Dexamethasone, Manual therapy, and Re-evaluation  PLAN FOR NEXT SESSION: review and progress exercise; continue with postural correction and education; manual work,DN, modalities as indicated    [employer], PT 07/06/2023, 2:01 PM

## 2023-08-04 DIAGNOSIS — M9902 Segmental and somatic dysfunction of thoracic region: Secondary | ICD-10-CM | POA: Diagnosis not present

## 2023-08-04 DIAGNOSIS — M5032 Other cervical disc degeneration, mid-cervical region, unspecified level: Secondary | ICD-10-CM | POA: Diagnosis not present

## 2023-08-04 DIAGNOSIS — M9903 Segmental and somatic dysfunction of lumbar region: Secondary | ICD-10-CM | POA: Diagnosis not present

## 2023-08-04 DIAGNOSIS — M9901 Segmental and somatic dysfunction of cervical region: Secondary | ICD-10-CM | POA: Diagnosis not present

## 2023-08-04 DIAGNOSIS — M9904 Segmental and somatic dysfunction of sacral region: Secondary | ICD-10-CM | POA: Diagnosis not present

## 2023-08-04 DIAGNOSIS — M5137 Other intervertebral disc degeneration, lumbosacral region: Secondary | ICD-10-CM | POA: Diagnosis not present

## 2023-08-04 DIAGNOSIS — M5136 Other intervertebral disc degeneration, lumbar region: Secondary | ICD-10-CM | POA: Diagnosis not present

## 2023-08-04 DIAGNOSIS — M5134 Other intervertebral disc degeneration, thoracic region: Secondary | ICD-10-CM | POA: Diagnosis not present

## 2023-09-06 DIAGNOSIS — L570 Actinic keratosis: Secondary | ICD-10-CM | POA: Diagnosis not present

## 2023-09-06 DIAGNOSIS — C44629 Squamous cell carcinoma of skin of left upper limb, including shoulder: Secondary | ICD-10-CM | POA: Diagnosis not present

## 2023-09-06 DIAGNOSIS — D485 Neoplasm of uncertain behavior of skin: Secondary | ICD-10-CM | POA: Diagnosis not present

## 2023-10-30 DIAGNOSIS — N1831 Chronic kidney disease, stage 3a: Secondary | ICD-10-CM | POA: Diagnosis not present

## 2023-10-30 DIAGNOSIS — Z681 Body mass index (BMI) 19 or less, adult: Secondary | ICD-10-CM | POA: Diagnosis not present

## 2023-10-30 DIAGNOSIS — F5101 Primary insomnia: Secondary | ICD-10-CM | POA: Diagnosis not present

## 2023-10-30 DIAGNOSIS — G43009 Migraine without aura, not intractable, without status migrainosus: Secondary | ICD-10-CM | POA: Diagnosis not present

## 2023-10-30 DIAGNOSIS — F419 Anxiety disorder, unspecified: Secondary | ICD-10-CM | POA: Diagnosis not present

## 2023-10-30 DIAGNOSIS — K52831 Collagenous colitis: Secondary | ICD-10-CM | POA: Diagnosis not present

## 2023-10-30 DIAGNOSIS — F320 Major depressive disorder, single episode, mild: Secondary | ICD-10-CM | POA: Diagnosis not present

## 2023-10-31 DIAGNOSIS — K52831 Collagenous colitis: Secondary | ICD-10-CM | POA: Diagnosis not present

## 2023-10-31 DIAGNOSIS — Z1211 Encounter for screening for malignant neoplasm of colon: Secondary | ICD-10-CM | POA: Diagnosis not present

## 2023-11-02 DIAGNOSIS — M5032 Other cervical disc degeneration, mid-cervical region, unspecified level: Secondary | ICD-10-CM | POA: Diagnosis not present

## 2023-11-02 DIAGNOSIS — M9904 Segmental and somatic dysfunction of sacral region: Secondary | ICD-10-CM | POA: Diagnosis not present

## 2023-11-02 DIAGNOSIS — M9901 Segmental and somatic dysfunction of cervical region: Secondary | ICD-10-CM | POA: Diagnosis not present

## 2023-11-02 DIAGNOSIS — M5136 Other intervertebral disc degeneration, lumbar region with discogenic back pain only: Secondary | ICD-10-CM | POA: Diagnosis not present

## 2023-11-02 DIAGNOSIS — M5134 Other intervertebral disc degeneration, thoracic region: Secondary | ICD-10-CM | POA: Diagnosis not present

## 2023-11-02 DIAGNOSIS — M5137 Other intervertebral disc degeneration, lumbosacral region with discogenic back pain only: Secondary | ICD-10-CM | POA: Diagnosis not present

## 2023-11-02 DIAGNOSIS — M9903 Segmental and somatic dysfunction of lumbar region: Secondary | ICD-10-CM | POA: Diagnosis not present

## 2023-11-02 DIAGNOSIS — M9902 Segmental and somatic dysfunction of thoracic region: Secondary | ICD-10-CM | POA: Diagnosis not present

## 2023-11-08 ENCOUNTER — Ambulatory Visit
Admission: EM | Admit: 2023-11-08 | Discharge: 2023-11-08 | Disposition: A | Discharge status: Home / Self Care | Payer: Medicare HMO | Attending: Family Medicine | Admitting: Family Medicine

## 2023-11-08 ENCOUNTER — Encounter: Payer: Self-pay | Admitting: Emergency Medicine

## 2023-11-08 ENCOUNTER — Ambulatory Visit: Payer: Medicare HMO

## 2023-11-08 ENCOUNTER — Telehealth: Payer: Self-pay | Admitting: Emergency Medicine

## 2023-11-08 DIAGNOSIS — W19XXXA Unspecified fall, initial encounter: Secondary | ICD-10-CM | POA: Diagnosis not present

## 2023-11-08 DIAGNOSIS — R0781 Pleurodynia: Secondary | ICD-10-CM

## 2023-11-08 DIAGNOSIS — S299XXA Unspecified injury of thorax, initial encounter: Secondary | ICD-10-CM

## 2023-11-08 DIAGNOSIS — Y92009 Unspecified place in unspecified non-institutional (private) residence as the place of occurrence of the external cause: Secondary | ICD-10-CM | POA: Diagnosis not present

## 2023-11-08 DIAGNOSIS — Z8679 Personal history of other diseases of the circulatory system: Secondary | ICD-10-CM | POA: Diagnosis not present

## 2023-11-08 DIAGNOSIS — R55 Syncope and collapse: Secondary | ICD-10-CM | POA: Diagnosis not present

## 2023-11-08 DIAGNOSIS — R0789 Other chest pain: Secondary | ICD-10-CM

## 2023-11-08 MED ORDER — HYDROCODONE-ACETAMINOPHEN 5-325 MG PO TABS: 1.0000 | ORAL_TABLET | Freq: Four times a day (QID) | ORAL | 0 refills | Status: DC | PRN

## 2023-11-08 NOTE — Discharge Instructions (Signed)
You will be called with your x-ray report Reduce your activity while ribs are painful Use a pillow to reduce pain with coughing and sneezing Take Tylenol for mild pain, take Tylenol with hydrocodone for severe pain.  Do not drive on the Tylenol with hydrocodone Follow-up with your doctor next week If your blood pressure drops, it is good to dangle your feet over the side of the bed for a minute before you try to stand up at night or in the morning

## 2023-11-08 NOTE — Telephone Encounter (Signed)
Patient informed per Dr Delton See of negative xray results.  Patient voices understanding.

## 2023-11-08 NOTE — ED Provider Notes (Signed)
[facility]    CSN: [id number] Arrival date & time: 11/08/23  1536      History   Chief Complaint Chief Complaint  Patient presents with   Chest Pain    HPI Mary Freeman is a 72 y.o. female.   Patient states that last night she passed out.  She states "I passed out all the time".  She states "my doctor does not care".  Apparently she states she drops her blood pressure.  Last night she took Ativan to go to bed.  She woke up sometime later and felt like she needed to go to the bathroom.  She states that she was trying to get into the bathroom she fainted and fell forward.  She hit the front of her chest on the bathroom sink.  Woke up later.  Does not remember all the details.  Is certain she did not hit her head.  She lives alone.  She did not think to call 911 last night.  She is here today in the afternoon to be evaluated for her chest wall pain.  She states she does not feel like she needs to be evaluated for the fainting spell.    Past Medical History:  Diagnosis Date   Depression    Headache    migraines   PONV (postoperative nausea and vomiting)     Patient Active Problem List   Diagnosis Date Noted   Anxiety 02/11/2021   Adjustment disorder with depressed mood 02/11/2021   Chronic kidney disease, stage 3 unspecified (HCC) 02/11/2021   Collagenous colitis 02/11/2021   Gastroesophageal reflux disease 02/11/2021   Migraine without aura, not refractory 02/11/2021   Mild depression 02/11/2021   Osteopenia 02/11/2021   Early satiety 01/12/2021   Bloating 01/12/2021   Abdominal pain 01/12/2021    Past Surgical History:  Procedure Laterality Date   APPENDECTOMY     BREAST EXCISIONAL BIOPSY Right    benign   COLONOSCOPY     KNEE ARTHROSCOPY Right    x3   KNEE ARTHROSCOPY W/ MENISCAL REPAIR Left    LUMBAR LAMINECTOMY  1986   REDUCTION MAMMAPLASTY Bilateral    2006   ROTATOR CUFF REPAIR Right 2012   TONSILLECTOMY     TUBAL LIGATION      OB  History     Gravida  3   Para  3   Term  3   Preterm      AB      Living         SAB      IAB      Ectopic      Multiple      Live Births  3            Home Medications    Prior to Admission medications   Medication Sig Start Date End Date Taking? Authorizing Provider  Budesonide (ENTOCORT EC PO) Take by mouth.   Yes [provider]  Calcium-Magnesium (CAL-MAG PO) Take 1 tablet by mouth daily.   Yes [provider]  cholecalciferol (VITAMIN D) 1000 units tablet Take 1,000 Units by mouth daily.   Yes [provider]  FLUoxetine (PROZAC) 20 MG capsule Take 20 mg by mouth daily. 12/25/22  Yes [provider]  HYDROcodone-acetaminophen (NORCO/VICODIN) 5-325 MG tablet Take 1-2 tablets by mouth every 6 (six) hours as needed. 11/08/23  Yes Eustace Moore, MD  LORazepam (ATIVAN) 0.5 MG tablet Take 0.5 mg by mouth at bedtime as  needed for sleep.   Yes [provider]  Multiple Vitamins-Minerals (MULTIVITAMIN WITH MINERALS) tablet Take 1 tablet by mouth daily.   Yes [provider]  rizatriptan (MAXALT) 10 MG tablet Take 10 mg by mouth as needed for migraine. May repeat in 2 hours if needed   Yes [provider]    Family History Family History  Problem Relation Age of Onset   Cancer Father        lung   Cancer Sister        breast   Breast cancer Sister 39   Alcoholism Sister    Alcoholism Mother    Alcoholism Brother    Cancer Sister    Breast cancer Sister 65   Alcoholism Daughter    Alcoholism Daughter     Social History Social History   Tobacco Use   Smoking status: Never   Smokeless tobacco: Never  Vaping Use   Vaping status: Never Used  Substance Use Topics   Alcohol use: Yes    Comment: very rare    Drug use: No     Allergies   Morphine and codeine, Dilaudid [hydromorphone], Diphenhydramine, Pentazocine, Prednisone, Stadol [butorphanol], Sulfa antibiotics, Benadryl  [diphenhydramine hcl], Flagyl [metronidazole], Solu-medrol [methylprednisolone], Amitriptyline, Butorphanol tartrate, Oxycodone-acetaminophen, and Promethazine   Review of Systems Review of Systems See HPI  Physical Exam Triage Vital Signs ED Triage Vitals  Encounter Vitals Group     BP 11/08/23 1556 92/60     Systolic BP Percentile --      Diastolic BP Percentile --      Pulse Rate 11/08/23 1556 80     Resp 11/08/23 1556 16     Temp --      Temp src --      SpO2 11/08/23 1556 98 %     Weight 11/08/23 1558 120 lb (54.4 kg)     Height 11/08/23 1558 5' 7.5" (1.715 m)     Head Circumference --      Peak Flow --      Pain Score 11/08/23 1558 8     Pain Loc --      Pain Education --      Exclude from Growth Chart --    No data found.  Updated Vital Signs BP 92/60 (BP Location: Right Arm)   Pulse 80   Resp 16   Ht 5' 7.5" (1.715 m)   Wt 54.4 kg   SpO2 98%   BMI 18.52 kg/m      Physical Exam Constitutional:      General: She is not in acute distress.    Appearance: She is well-developed and normal weight. She is not ill-appearing.  HENT:     Head: Normocephalic and atraumatic.  Eyes:     Conjunctiva/sclera: Conjunctivae normal.     Pupils: Pupils are equal, round, and reactive to light.  Cardiovascular:     Rate and Rhythm: Normal rate and regular rhythm.     Heart sounds: Normal heart sounds.  Pulmonary:     Effort: Pulmonary effort is normal. No respiratory distress.     Breath sounds: Normal breath sounds.  Chest:    Abdominal:     General: There is no distension.     Palpations: Abdomen is soft.  Musculoskeletal:        General: Normal range of motion.     Cervical back: Normal range of motion.     Comments: Patient complains of shoulder pain.  She points to her  deltoid area.  She has good range of motion, nearly full, mild limitation to internal rotation.  No bruising.  No palpable defect or bony tenderness  Skin:    General: Skin is warm and dry.   Neurological:     General: No focal deficit present.     Mental Status: She is alert.      UC Treatments / Results  Labs (all labs ordered are listed, but only abnormal results are displayed) Labs Reviewed - No data to display  EKG   Radiology No results found.  Procedures Procedures (including critical care time)  Medications Ordered in UC Medications - No data to display  Initial Impression / Assessment and Plan / UC Course  I have reviewed the triage vital signs and the nursing notes.  Pertinent labs & imaging results that were available during my care of the patient were reviewed by me and considered in my medical decision making (see chart for details).     Patient has multiple allergies to pain medication.  I have concerns about giving her pain medication when she tells me she "faints all the time".  I discussed with her drinking lots of fluids.  Dangling her legs over the side of the bed before she tries to stand up.  Will give her a limited number of hydrocodone to take if Tylenol is not strong enough.  She states she cannot take NSAID drugs because of her impaired kidney function. Patient was discharged prior to x-rays being read.  I told her that I do not see anything major going on but there may be 1 cracked rib.  We will call her when her test results are available  Addendum: X-rays are negative.  Patient is called with this result.  There is no change in management Final Clinical Impressions(s) / UC Diagnoses   Final diagnoses:  Syncope, unspecified syncope type  History of orthostatic hypotension  Fall at home, initial encounter  Chest wall pain     Discharge Instructions      You will be called with your x-ray report Reduce your activity while ribs are painful Use a pillow to reduce pain with coughing and sneezing Take Tylenol for mild pain, take Tylenol with hydrocodone for severe pain.  Do not drive on the Tylenol with hydrocodone Follow-up with  your doctor next week If your blood pressure drops, it is good to dangle your feet over the side of the bed for a minute before you try to stand up at night or in the morning     ED Prescriptions     Medication Sig Dispense Auth. Provider   HYDROcodone-acetaminophen (NORCO/VICODIN) 5-325 MG tablet Take 1-2 tablets by mouth every 6 (six) hours as needed. 15 tablet Eustace Moore, MD      I have reviewed the PDMP during this encounter.   Eustace Moore, MD 11/08/23 [phone]

## 2023-11-08 NOTE — ED Triage Notes (Signed)
Patient states that she passed out last night, hitting her chest and left arm on the sink.  Patient doesn't remember anything after passing out.  States that she did not hit her head, concerned for possible rib fracture.  Denies SOB.

## 2023-11-14 DIAGNOSIS — H524 Presbyopia: Secondary | ICD-10-CM | POA: Diagnosis not present

## 2023-11-14 DIAGNOSIS — Z961 Presence of intraocular lens: Secondary | ICD-10-CM | POA: Diagnosis not present

## 2023-11-14 DIAGNOSIS — H04123 Dry eye syndrome of bilateral lacrimal glands: Secondary | ICD-10-CM | POA: Diagnosis not present

## 2023-11-14 DIAGNOSIS — H40013 Open angle with borderline findings, low risk, bilateral: Secondary | ICD-10-CM | POA: Diagnosis not present

## 2023-11-15 DIAGNOSIS — M5137 Other intervertebral disc degeneration, lumbosacral region with discogenic back pain only: Secondary | ICD-10-CM | POA: Diagnosis not present

## 2023-11-15 DIAGNOSIS — M9902 Segmental and somatic dysfunction of thoracic region: Secondary | ICD-10-CM | POA: Diagnosis not present

## 2023-11-15 DIAGNOSIS — M9903 Segmental and somatic dysfunction of lumbar region: Secondary | ICD-10-CM | POA: Diagnosis not present

## 2023-11-15 DIAGNOSIS — M5134 Other intervertebral disc degeneration, thoracic region: Secondary | ICD-10-CM | POA: Diagnosis not present

## 2023-11-15 DIAGNOSIS — M9901 Segmental and somatic dysfunction of cervical region: Secondary | ICD-10-CM | POA: Diagnosis not present

## 2023-11-15 DIAGNOSIS — M5136 Other intervertebral disc degeneration, lumbar region with discogenic back pain only: Secondary | ICD-10-CM | POA: Diagnosis not present

## 2023-11-15 DIAGNOSIS — M5032 Other cervical disc degeneration, mid-cervical region, unspecified level: Secondary | ICD-10-CM | POA: Diagnosis not present

## 2023-11-15 DIAGNOSIS — M9904 Segmental and somatic dysfunction of sacral region: Secondary | ICD-10-CM | POA: Diagnosis not present

## 2023-11-17 DIAGNOSIS — Z681 Body mass index (BMI) 19 or less, adult: Secondary | ICD-10-CM | POA: Diagnosis not present

## 2023-11-17 DIAGNOSIS — R55 Syncope and collapse: Secondary | ICD-10-CM | POA: Diagnosis not present

## 2023-11-17 DIAGNOSIS — Z9181 History of falling: Secondary | ICD-10-CM | POA: Diagnosis not present

## 2023-11-17 DIAGNOSIS — M8580 Other specified disorders of bone density and structure, unspecified site: Secondary | ICD-10-CM | POA: Diagnosis not present

## 2023-11-17 DIAGNOSIS — R5383 Other fatigue: Secondary | ICD-10-CM | POA: Diagnosis not present

## 2023-12-12 DIAGNOSIS — R55 Syncope and collapse: Secondary | ICD-10-CM | POA: Diagnosis not present

## 2023-12-13 DIAGNOSIS — J069 Acute upper respiratory infection, unspecified: Secondary | ICD-10-CM | POA: Diagnosis not present

## 2023-12-13 DIAGNOSIS — N1831 Chronic kidney disease, stage 3a: Secondary | ICD-10-CM | POA: Diagnosis not present

## 2023-12-13 DIAGNOSIS — Z681 Body mass index (BMI) 19 or less, adult: Secondary | ICD-10-CM | POA: Diagnosis not present

## 2023-12-26 DIAGNOSIS — K648 Other hemorrhoids: Secondary | ICD-10-CM | POA: Diagnosis not present

## 2023-12-26 DIAGNOSIS — K52831 Collagenous colitis: Secondary | ICD-10-CM | POA: Diagnosis not present

## 2023-12-26 DIAGNOSIS — K5730 Diverticulosis of large intestine without perforation or abscess without bleeding: Secondary | ICD-10-CM | POA: Diagnosis not present

## 2023-12-26 DIAGNOSIS — D123 Benign neoplasm of transverse colon: Secondary | ICD-10-CM | POA: Diagnosis not present

## 2023-12-26 DIAGNOSIS — R197 Diarrhea, unspecified: Secondary | ICD-10-CM | POA: Diagnosis not present

## 2023-12-26 DIAGNOSIS — D128 Benign neoplasm of rectum: Secondary | ICD-10-CM | POA: Diagnosis not present

## 2023-12-26 DIAGNOSIS — Z1211 Encounter for screening for malignant neoplasm of colon: Secondary | ICD-10-CM | POA: Diagnosis not present

## 2023-12-27 DIAGNOSIS — R55 Syncope and collapse: Secondary | ICD-10-CM | POA: Diagnosis not present

## 2023-12-27 DIAGNOSIS — M9902 Segmental and somatic dysfunction of thoracic region: Secondary | ICD-10-CM | POA: Diagnosis not present

## 2023-12-27 DIAGNOSIS — M5134 Other intervertebral disc degeneration, thoracic region: Secondary | ICD-10-CM | POA: Diagnosis not present

## 2023-12-27 DIAGNOSIS — M9904 Segmental and somatic dysfunction of sacral region: Secondary | ICD-10-CM | POA: Diagnosis not present

## 2023-12-27 DIAGNOSIS — M5137 Other intervertebral disc degeneration, lumbosacral region with discogenic back pain only: Secondary | ICD-10-CM | POA: Diagnosis not present

## 2023-12-27 DIAGNOSIS — M5136 Other intervertebral disc degeneration, lumbar region with discogenic back pain only: Secondary | ICD-10-CM | POA: Diagnosis not present

## 2023-12-27 DIAGNOSIS — M5032 Other cervical disc degeneration, mid-cervical region, unspecified level: Secondary | ICD-10-CM | POA: Diagnosis not present

## 2023-12-27 DIAGNOSIS — M9901 Segmental and somatic dysfunction of cervical region: Secondary | ICD-10-CM | POA: Diagnosis not present

## 2023-12-27 DIAGNOSIS — M9903 Segmental and somatic dysfunction of lumbar region: Secondary | ICD-10-CM | POA: Diagnosis not present

## 2023-12-28 DIAGNOSIS — D128 Benign neoplasm of rectum: Secondary | ICD-10-CM | POA: Diagnosis not present

## 2023-12-28 DIAGNOSIS — K52831 Collagenous colitis: Secondary | ICD-10-CM | POA: Diagnosis not present

## 2023-12-28 DIAGNOSIS — D123 Benign neoplasm of transverse colon: Secondary | ICD-10-CM | POA: Diagnosis not present

## 2023-12-28 DIAGNOSIS — Z85828 Personal history of other malignant neoplasm of skin: Secondary | ICD-10-CM | POA: Diagnosis not present

## 2023-12-28 DIAGNOSIS — L821 Other seborrheic keratosis: Secondary | ICD-10-CM | POA: Diagnosis not present

## 2023-12-28 DIAGNOSIS — L814 Other melanin hyperpigmentation: Secondary | ICD-10-CM | POA: Diagnosis not present

## 2023-12-28 DIAGNOSIS — L570 Actinic keratosis: Secondary | ICD-10-CM | POA: Diagnosis not present

## 2024-01-01 DIAGNOSIS — G4721 Circadian rhythm sleep disorder, delayed sleep phase type: Secondary | ICD-10-CM | POA: Diagnosis not present

## 2024-02-05 DIAGNOSIS — R55 Syncope and collapse: Secondary | ICD-10-CM | POA: Diagnosis not present

## 2024-02-05 DIAGNOSIS — I471 Supraventricular tachycardia, unspecified: Secondary | ICD-10-CM | POA: Diagnosis not present

## 2024-02-20 ENCOUNTER — Other Ambulatory Visit (HOSPITAL_COMMUNITY): Payer: Self-pay | Admitting: Family Medicine

## 2024-02-20 DIAGNOSIS — I4729 Other ventricular tachycardia: Secondary | ICD-10-CM

## 2024-02-20 DIAGNOSIS — I471 Supraventricular tachycardia, unspecified: Secondary | ICD-10-CM

## 2024-02-20 DIAGNOSIS — R55 Syncope and collapse: Secondary | ICD-10-CM

## 2024-03-01 ENCOUNTER — Ambulatory Visit (HOSPITAL_COMMUNITY)
Admission: RE | Admit: 2024-03-01 | Discharge: 2024-03-01 | Disposition: A | Discharge status: Home / Self Care | Source: Ambulatory Visit | Attending: Family Medicine | Admitting: Family Medicine

## 2024-03-01 DIAGNOSIS — I4729 Other ventricular tachycardia: Secondary | ICD-10-CM | POA: Insufficient documentation

## 2024-03-01 DIAGNOSIS — R55 Syncope and collapse: Secondary | ICD-10-CM | POA: Insufficient documentation

## 2024-03-01 DIAGNOSIS — I071 Rheumatic tricuspid insufficiency: Secondary | ICD-10-CM | POA: Diagnosis not present

## 2024-03-01 DIAGNOSIS — I3139 Other pericardial effusion (noninflammatory): Secondary | ICD-10-CM | POA: Insufficient documentation

## 2024-03-01 DIAGNOSIS — I471 Supraventricular tachycardia, unspecified: Secondary | ICD-10-CM | POA: Insufficient documentation

## 2024-03-01 LAB — ECHOCARDIOGRAM COMPLETE
AR max vel: 2.87 cm2
AV Area VTI: 2.94 cm2
AV Area mean vel: 3.02 cm2
AV Mean grad: 2 mmHg
AV Peak grad: 3.5 mmHg
Ao pk vel: 0.93 m/s
Area-P 1/2: 2.17 cm2
Calc EF: 40.7 %
MV VTI: 3.21 cm2
S' Lateral: 3 cm
Single Plane A2C EF: 44.1 %
Single Plane A4C EF: 37.9 %

## 2024-03-11 DIAGNOSIS — N1831 Chronic kidney disease, stage 3a: Secondary | ICD-10-CM | POA: Diagnosis not present

## 2024-03-11 DIAGNOSIS — Z681 Body mass index (BMI) 19 or less, adult: Secondary | ICD-10-CM | POA: Diagnosis not present

## 2024-03-11 DIAGNOSIS — I471 Supraventricular tachycardia, unspecified: Secondary | ICD-10-CM | POA: Diagnosis not present

## 2024-03-11 DIAGNOSIS — I4729 Other ventricular tachycardia: Secondary | ICD-10-CM | POA: Diagnosis not present

## 2024-03-13 DIAGNOSIS — C44722 Squamous cell carcinoma of skin of right lower limb, including hip: Secondary | ICD-10-CM | POA: Diagnosis not present

## 2024-03-13 DIAGNOSIS — C44629 Squamous cell carcinoma of skin of left upper limb, including shoulder: Secondary | ICD-10-CM | POA: Diagnosis not present

## 2024-03-13 DIAGNOSIS — D485 Neoplasm of uncertain behavior of skin: Secondary | ICD-10-CM | POA: Diagnosis not present

## 2024-03-27 DIAGNOSIS — M9903 Segmental and somatic dysfunction of lumbar region: Secondary | ICD-10-CM | POA: Diagnosis not present

## 2024-03-27 DIAGNOSIS — M5137 Other intervertebral disc degeneration, lumbosacral region with discogenic back pain only: Secondary | ICD-10-CM | POA: Diagnosis not present

## 2024-03-27 DIAGNOSIS — M5032 Other cervical disc degeneration, mid-cervical region, unspecified level: Secondary | ICD-10-CM | POA: Diagnosis not present

## 2024-03-27 DIAGNOSIS — M9904 Segmental and somatic dysfunction of sacral region: Secondary | ICD-10-CM | POA: Diagnosis not present

## 2024-03-27 DIAGNOSIS — M9902 Segmental and somatic dysfunction of thoracic region: Secondary | ICD-10-CM | POA: Diagnosis not present

## 2024-03-27 DIAGNOSIS — M5134 Other intervertebral disc degeneration, thoracic region: Secondary | ICD-10-CM | POA: Diagnosis not present

## 2024-03-27 DIAGNOSIS — M9901 Segmental and somatic dysfunction of cervical region: Secondary | ICD-10-CM | POA: Diagnosis not present

## 2024-03-27 DIAGNOSIS — M5136 Other intervertebral disc degeneration, lumbar region with discogenic back pain only: Secondary | ICD-10-CM | POA: Diagnosis not present

## 2024-03-28 ENCOUNTER — Ambulatory Visit

## 2024-03-28 ENCOUNTER — Encounter: Payer: Self-pay | Admitting: *Deleted

## 2024-03-28 VITALS — BP 100/70 | HR 72 | Ht 67.5 in | Wt 122.0 lb

## 2024-03-28 DIAGNOSIS — I429 Cardiomyopathy, unspecified: Secondary | ICD-10-CM

## 2024-03-28 DIAGNOSIS — R002 Palpitations: Secondary | ICD-10-CM | POA: Diagnosis not present

## 2024-03-28 DIAGNOSIS — R55 Syncope and collapse: Secondary | ICD-10-CM

## 2024-03-28 DIAGNOSIS — N183 Chronic kidney disease, stage 3 unspecified: Secondary | ICD-10-CM | POA: Insufficient documentation

## 2024-03-28 HISTORY — DX: Syncope and collapse: R55

## 2024-03-28 HISTORY — DX: Cardiomyopathy, unspecified: I42.9

## 2024-03-28 HISTORY — DX: Palpitations: R00.2

## 2024-03-28 MED ORDER — METOPROLOL SUCCINATE ER 25 MG PO TB24: 12.5000 mg | ORAL_TABLET | Freq: Every day | ORAL | 3 refills | Status: AC

## 2024-03-28 MED ORDER — IVABRADINE HCL 5 MG PO TABS: 10.0000 mg | ORAL_TABLET | ORAL | 0 refills | Status: DC

## 2024-03-28 MED ORDER — ASPIRIN 81 MG PO TBEC: 81.0000 mg | DELAYED_RELEASE_TABLET | Freq: Every day | ORAL | Status: DC

## 2024-03-28 NOTE — Progress Notes (Signed)
 Per Dr. Ronell Coe patient is to take metoprolol  Succinate 25 mg and Corlanor  10 mg two hours before her CT

## 2024-03-28 NOTE — Patient Instructions (Addendum)
 Medication Instructions:   Start Aspirin  81 mg once a day   Start Metoprolol  Succinate 12.5 mg once a day   *If you need a refill on your cardiac medications before your next appointment, please call your pharmacy*     Lab Work: Your physician recommends that you return for lab work in: next few days  You can have these done at [address] at  [facility] You need to have labs done when you are fasting.  You can come Monday through Friday 8:00 am to 11:30 am and 1:00 to 4:00. You do not need to make an appointment as the order has already been placed.The labs you are going to have done are Lipid, CMP, Magnesium, and Pro BNP    If you have labs (blood work) drawn today and your tests are completely normal, you will receive your results only by: MyChart Message (if you have MyChart) OR A paper copy in the mail If you have any lab test that is abnormal or we need to change your treatment, we will call you to review the results.   Testing/Procedures:     Your physician has requested that you have a carotid duplex. This test is an ultrasound of the carotid arteries in your neck. It looks at blood flow through these arteries that supply the brain with blood. Allow one hour for this exam. There are no restrictions or special instructions.   Please follow these instructions carefully (unless otherwise directed):  Hold all erectile dysfunction medications at least 3 days (72 hrs) prior to test.  On the Night Before the Test: Be sure to Drink plenty of water. Do not consume any caffeinated/decaffeinated beverages or chocolate 12 hours prior to your test. Do not take any antihistamines 12 hours prior to your test.   On the Day of the Test: Drink plenty of water until 1 hour prior to the test. Do not eat any food 4 hours prior to the test. You may take your regular medications prior to the test.  Take metoprolol  25 mg and Corlanor 10 mg  two hours prior to test. HOLD  Furosemide/Hydrochlorothiazide morning of the test. FEMALES- please wear underwire-free bra if available, avoid dresses & tight clothing         After the Test: Drink plenty of water. After receiving IV contrast, you may experience a mild flushed feeling. This is normal. On occasion, you may experience a mild rash up to 24 hours after the test. This is not dangerous. If this occurs, you can take Benadryl 25 mg and increase your fluid intake. If you experience trouble breathing, this can be serious. If it is severe call 911 IMMEDIATELY. If it is mild, please call our office. If you take any of these medications: Glipizide/Metformin, Avandament, Glucavance, please do not take 48 hours after completing test unless otherwise instructed.  We will call to schedule your test 2-4 weeks out understanding that some insurance companies will need an authorization prior to the service being performed.   For non-scheduling related questions, please contact the cardiac imaging nurse navigator should you have any questions/concerns: Jinger Mount, Cardiac Imaging Nurse Navigator Chase Copping, Cardiac Imaging Nurse Navigator [facility] Direct Office Dial: [phone]   For scheduling needs, including cancellations and rescheduling, please call [place], [phone].    Follow-Up: At [facility], you and your health needs are our priority.  As part of our continuing mission to provide you with exceptional heart care,  we have created designated Provider Care Teams.  These Care Teams include your primary Cardiologist (physician) and Advanced Practice Providers (APPs -  Physician Assistants and Nurse Practitioners) who all work together to provide you with the care you need, when you need it.  We recommend signing up for the patient portal called "MyChart".  Sign up information is provided on this After Visit Summary.  MyChart is used to connect with patients for Virtual  Visits (Telemedicine).  Patients are able to view lab/test results, encounter notes, upcoming appointments, etc.  Non-urgent messages can be sent to your provider as well.   To learn more about what you can do with MyChart, go to [email or web address].    Your next appointment:     Other Instructions Cardiac CT Angiogram A cardiac CT angiogram is a procedure to look at the heart and the area around the heart. It may be done to help find the cause of chest pains or other symptoms of heart disease. During this procedure, a substance called contrast dye is injected into the blood vessels in the area to be checked. A large X-ray machine, called a CT scanner, then takes detailed pictures of the heart and the surrounding area. The procedure is also sometimes called a coronary CT angiogram, coronary artery scanning, or CTA. A cardiac CT angiogram allows the health care provider to see how well blood is flowing to and from the heart. The health care provider will be able to see if there are any problems, such as: Blockage or narrowing of the coronary arteries in the heart. Fluid around the heart. Signs of weakness or disease in the muscles, valves, and tissues of the heart. Tell a health care provider about: Any allergies you have. This is especially important if you have had a previous allergic reaction to contrast dye. All medicines you are taking, including vitamins, herbs, eye drops, creams, and over-the-counter medicines. Any blood disorders you have. Any surgeries you have had. Any medical conditions you have. Whether you are pregnant or may be pregnant. Any anxiety disorders, chronic pain, or other conditions you have that may increase your stress or prevent you from lying still. What are the risks? Generally, this is a safe procedure. However, problems may occur, including: Bleeding. Infection. Allergic reactions to medicines or dyes. Damage to other structures or organs. Kidney damage  from the contrast dye that is used. Increased risk of cancer from radiation exposure. This risk is low. Talk with your health care provider about: The risks and benefits of testing. How you can receive the lowest dose of radiation. What happens before the procedure? Wear comfortable clothing and remove any jewelry, glasses, dentures, and hearing aids. Follow instructions from your health care provider about eating and drinking. This may include: For 12 hours before the procedure -- avoid caffeine. This includes tea, coffee, soda, energy drinks, and diet pills. Drink plenty of water or other fluids that do not have caffeine in them. Being well hydrated can prevent complications. For 4-6 hours before the procedure -- stop eating and drinking. The contrast dye can cause nausea, but this is less likely if your stomach is empty. Ask your health care provider about changing or stopping your regular medicines. This is especially important if you are taking diabetes medicines, blood thinners, or medicines to treat problems with erections (erectile dysfunction). What happens during the procedure?  Hair on your chest may need to be removed so that small sticky patches called electrodes can be placed on your  chest. These will transmit information that helps to monitor your heart during the procedure. An IV will be inserted into one of your veins. You might be given a medicine to control your heart rate during the procedure. This will help to ensure that good images are obtained. You will be asked to lie on an exam table. This table will slide in and out of the CT machine during the procedure. Contrast dye will be injected into the IV. You might feel warm, or you may get a metallic taste in your mouth. You will be given a medicine called nitroglycerin. This will relax or dilate the arteries in your heart. The table that you are lying on will move into the CT machine tunnel for the scan. The person running the  machine will give you instructions while the scans are being done. You may be asked to: Keep your arms above your head. Hold your breath. Stay very still, even if the table is moving. When the scanning is complete, you will be moved out of the machine. The IV will be removed. The procedure may vary among health care providers and hospitals. What can I expect after the procedure? After your procedure, it is common to have: A metallic taste in your mouth from the contrast dye. A feeling of warmth. A headache from the nitroglycerin. Follow these instructions at home: Take over-the-counter and prescription medicines only as told by your health care provider. If you are told, drink enough fluid to keep your urine pale yellow. This will help to flush the contrast dye out of your body. Most people can return to their normal activities right after the procedure. Ask your health care provider what activities are safe for you. It is up to you to get the results of your procedure. Ask your health care provider, or the department that is doing the procedure, when your results will be ready. Keep all follow-up visits as told by your health care provider. This is important. Contact a health care provider if: You have any symptoms of allergy to the contrast dye. These include: Shortness of breath. Rash or hives. A racing heartbeat. Summary A cardiac CT angiogram is a procedure to look at the heart and the area around the heart. It may be done to help find the cause of chest pains or other symptoms of heart disease. During this procedure, a large X-ray machine, called a CT scanner, takes detailed pictures of the heart and the surrounding area after a contrast dye has been injected into blood vessels in the area. Ask your health care provider about changing or stopping your regular medicines before the procedure. This is especially important if you are taking diabetes medicines, blood thinners, or medicines  to treat erectile dysfunction. If you are told, drink enough fluid to keep your urine pale yellow. This will help to flush the contrast dye out of your body. This information is not intended to replace advice given to you by your health care provider. Make sure you discuss any questions you have with your health care provider. Document Revised: 07/10/2019 Document Reviewed: 07/10/2019 Elsevier Patient Education  2020 [employer].

## 2024-03-28 NOTE — Assessment & Plan Note (Addendum)
 New diagnosis on echocardiogram April 2025 EF 40 to 45%, diffuse hypokinesis reported, although regional variations appeared on my review of the images. Does not appear to be in acute heart failure. Appears euvolemic, compensated. Functional status is somewhat limited due to generalized weakness and lightheadedness with activities at times.  Educated about salt restriction to below 2 g/day. Advised about monitoring weights.  Guideline directed medical therapy initiation at this time is limited due to soft blood pressures and symptoms of lightheadedness. Will initiate low-dose metoprolol  succinate 12.5 mg once daily. Will consider adding additional medications as tolerated depending on further workup as being planned.  For etiology of cardiomyopathy workup we will proceed with cardiac CT coronary angiogram to assess for coronary artery disease. Reviewed use of IV contrast and potential use of Corlanor for heart rate optimization. We reviewed her current chronic kidney disease stage III and potential risk of contrast use however based on risk-benefit discussion decided to proceed with cardiac CT.  Alternates of stress test and cardiac catheterization reviewed.  If no significant obstructive coronary artery disease, will obtain cardiac MRI to assess etiology of her cardiomyopathy.  Obtain CMP, magnesium, proBNP levels. Start taking aspirin  81 mg once daily

## 2024-03-28 NOTE — Assessment & Plan Note (Addendum)
 Infrequent episodes described as skipped beats or extra beat sensation. Abnormal Zio patch results at PCPs office per patient. Report not available for my review at this time.  Will request copy.  Denies any significant bradycardia episodes.  Will start low-dose metoprolol  succinate 12.5 mg once daily.  Reviewed with her the medication and she is agreeable to start.  Addendum: Heart monitor results from December reviewed 3.1% SVT burden, 18-second run of ectopic atrial tachycardia asymptomatic, rare ventricular ectopy burden less than 1%, runs of NSVT up to 10 beats.  Overall sinus rhythm with average heart rate 74/min [ranging from 46 to 142 bpm]. Plan remains the same as discussed regarding palpitations and cardiomyopathy.

## 2024-03-28 NOTE — Assessment & Plan Note (Signed)
 At least 3 episodes of syncope. The episode in December 2024 appears more concerning for TIA with extended transient visual loss.  From cardiac standpoint will review Zio patch results once available. Will obtain ultrasound carotids. Advised her caution changing position from sitting or supine to standing. Advised to sit down immediately at the start of any symptoms suggestive of lightheadedness.  Recommended to follow-up with PCP to further evaluate any neurological causes for her symptoms.  Possibly imaging with CT or MRI I will defer this to her PCPs assessment

## 2024-03-28 NOTE — Progress Notes (Addendum)
 Cardiology Consultation:    Date:  03/28/2024   ID:  Mary Freeman, DOB 15-Sep-1951, MRN [id number]  PCP:  Mary Fought, PA  Cardiologist:  Mary Evans Nazim Kadlec, MD   Referring MD: Mary Fought, PA   Chief Complaint  Patient presents with   Tachycardia     ASSESSMENT AND PLAN:   Mary Freeman 73 year old with history of CKD stage III, recurrent syncopal episodes over the past 6 months, abnormal echocardiogram results from April 2025 EF 40 to 45%, reported abnormal Zio patch results with SVT and NSVT episodes [unknown duration at this time requesting records], here for further evaluation.  Problem List Items Addressed This Visit     Palpitations   Infrequent episodes described as skipped beats or extra beat sensation. Abnormal Zio patch results at PCPs office per patient. Report not available for my review at this time.  Will request copy.  Denies any significant bradycardia episodes.  Will start low-dose metoprolol  succinate 12.5 mg once daily.  Reviewed with her the medication and she is agreeable to start.  Addendum: Heart monitor results from December reviewed 3.1% SVT burden, 18-second run of ectopic atrial tachycardia asymptomatic, rare ventricular ectopy burden less than 1%, runs of NSVT up to 10 beats.  Overall sinus rhythm with average heart rate 74/min [ranging from 46 to 142 bpm]. Plan remains the same as discussed regarding palpitations and cardiomyopathy.        Cardiomyopathy [facility]) - Primary   New diagnosis on echocardiogram April 2025 EF 40 to 45%, diffuse hypokinesis reported, although regional variations appeared on my review of the images. Does not appear to be in acute heart failure. Appears euvolemic, compensated. Functional status is somewhat limited due to generalized weakness and lightheadedness with activities at times.  Educated about salt restriction to below 2 g/day. Advised about monitoring weights.  Guideline directed medical therapy  initiation at this time is limited due to soft blood pressures and symptoms of lightheadedness. Will initiate low-dose metoprolol  succinate 12.5 mg once daily. Will consider adding additional medications as tolerated depending on further workup as being planned.  For etiology of cardiomyopathy workup we will proceed with cardiac CT coronary angiogram to assess for coronary artery disease. Reviewed use of IV contrast and potential use of Corlanor for heart rate optimization. We reviewed her current chronic kidney disease stage III and potential risk of contrast use however based on risk-benefit discussion decided to proceed with cardiac CT.  Alternates of stress test and cardiac catheterization reviewed.  If no significant obstructive coronary artery disease, will obtain cardiac MRI to assess etiology of her cardiomyopathy.  Obtain CMP, magnesium, proBNP levels. Start taking aspirin  81 mg once daily      Relevant Medications   aspirin  EC 81 MG tablet   metoprolol  succinate (TOPROL -XL) 25 MG 24 hr tablet   ivabradine  (CORLANOR) 5 MG TABS tablet   Other Relevant Orders   EKG 12-Lead (Completed)   CT CORONARY MORPH W/CTA COR W/SCORE W/CA W/CM &/OR WO/CM   Lipid panel   Pro b natriuretic peptide (BNP)   Magnesium   Comprehensive metabolic panel with GFR   Syncope   At least 3 episodes of syncope. The episode in December 2024 appears more concerning for TIA with extended transient visual loss.  From cardiac standpoint will review Zio patch results once available. Will obtain ultrasound carotids. Advised her caution changing position from sitting or supine to standing. Advised to sit down immediately at the start of any symptoms suggestive of lightheadedness.  Recommended to follow-up with PCP to further evaluate any neurological causes for her symptoms.  Possibly imaging with CT or MRI I will defer this to her PCPs assessment       Relevant Orders   EKG 12-Lead (Completed)   VAS US   CAROTID   Addendum 03-28-2024 review of Zio patch results available after she left the office and received from PCP:  14-day Zio patch monitor from 11-17-2023 noted predominantly sinus rhythm with average heart rate 74/min [ranging from 46 bpm to 142 bpm].  Supraventricular ectopy burden occasional up to 3.1% and ventricular ectopy burden rare less than 1%.  4 runs of nonsustained ventricular tachycardia observed with the longest episode lasting 10 beats.  88 runs of supraventricular tachycardia recorded with the longest lasting 18 seconds [ectopic atrial tachycardia].  Total recorded events with triggers and diary entries 26, most correlated with sinus rhythm and sinus tachycardia up to 139 bpm and at times with isolated supraventricular and ventricular ectopic beats.  No pauses or A-fib.  History of Present Illness:    Mary Freeman is a 73 y.o. female who is being seen today for the evaluation of syncope and cardiomyopathy at the request of Mary Freeman, [place].  Pleasant woman here for the visit accompanied by her friend.  She is a retired Engineer, civil (consulting).  Lives by herself at home.  Has a son and a daughter.  Syncopal episodes, episodes of SVT and NSVT reported on Zio patch results which are not available for my review at this time, CKD stage III, now with reduced LVEF 40 to 45% on echocardiogram April 2025. Denies any significant prior history of CAD, CHF, CVA.  Mentions since November 2024 she has had about 3 episodes of syncope.  In November while she was in the kitchen after she stood up felt lightheaded and passed out again consciousness.  In December as she was walking to the restroom at night could not find the door and fell forward.  Subsequently regained consciousness but reports transient visual loss that persisted for a long time until she fell asleep several minutes later and symptoms resolved following morning. Another episode around a month while in the restroom tilting her head back to put  eyedrops, fell backwards and regained consciousness within few minutes and had no residual symptoms.  Interestingly she did not have any workup done after her reported syncopal episode in December with transient but sustained vision loss and somewhat of altered mentation.  To me this appears suspicious for a TIA episode.  She also reports lightheadedness at times working in the yard or when she bends down and stands up.  With the symptoms she has to lean against objects to get support or sit down until the symptoms subside, which can take up to several minutes.  Denies any chest pain. Denies any orthopnea or paroxysmal nocturnal dyspnea.  No pedal edema.  Mentions she is lost up to 20 pounds after prolonged illness lasting for 9 days after COVID-19 vaccine shot 3 years ago. No significant weight changes over the past year.  She does report sensation of extra beats or skipped beats at times infrequent.  No sustained episodes associated with syncopal episodes.  Zio patch results per her down for 14 days reported instances of SVT and NSVT.  I do not have these results for review.  Will request from her PCPs office.  Non-smoker Rare alcohol consumption. Occasional coffee consumption. Retired after working as a Engineer, civil (consulting).  EKG in the clinic today sinus  rhythm heart rate 72/min, PR interval 140 ms, QRS duration 74 ms, QTc 424 ms.  Echocardiogram done recently 03/01/2024 noted mildly reduced LVEF 40 to 45% with global hypokinesis, grade 1 diastolic dysfunction, RV function normal.  Small pericardial effusion adjacent to the right ventricle reported.  Mild to moderate tricuspid regurgitation noted.  Interatrial septum reported to be aneurysmal without any evidence of shunt on color Doppler.  Reviewed images myself and the degree of regional wall motion abnormality of mid anterior and lateral wall segments appears to be present.  Last blood work available to review is from 11/17/2023 BUN 12, creatinine  1.24, EGFR 47, suggest CKD stage III. Normal transaminases and alkaline phosphatase Hemoglobin 13.8, hematocrit 40.4, WBC 7.4, platelets 250 TSH 1.13, normal   Past Medical History:  Diagnosis Date   Chronic kidney disease, stage 3 (HCC)    Depression    Headache    migraines   Hypotension    NSVT (nonsustained ventricular tachycardia) (HCC)    PONV (postoperative nausea and vomiting)    SVT (supraventricular tachycardia) (HCC)    Syncope and collapse     Past Surgical History:  Procedure Laterality Date   APPENDECTOMY     BREAST EXCISIONAL BIOPSY Right    benign   COLONOSCOPY     KNEE ARTHROSCOPY Right    x3   KNEE ARTHROSCOPY W/ MENISCAL REPAIR Left    LUMBAR LAMINECTOMY  1986   REDUCTION MAMMAPLASTY Bilateral    2006   ROTATOR CUFF REPAIR Right 2012   TONSILLECTOMY     TUBAL LIGATION      Current Medications: Current Meds  Medication Sig   aspirin  EC 81 MG tablet Take 1 tablet (81 mg total) by mouth daily. Swallow whole.   b complex vitamins capsule Take 1 capsule by mouth daily.   Budesonide (ENTOCORT EC PO) Take 3 capsules by mouth daily.   Calcium-Magnesium (CAL-MAG PO) Take 1 tablet by mouth 2 (two) times daily. 1000-800 mg   cholecalciferol (VITAMIN D) 1000 units tablet Take 1,000 Units by mouth daily.   FLUoxetine (PROZAC) 20 MG capsule Take 20 mg by mouth daily.   ivabradine  (CORLANOR) 5 MG TABS tablet Take 2 tablets (10 mg total) by mouth as directed. Take two hours before your cardiac ct   metoprolol  succinate (TOPROL -XL) 25 MG 24 hr tablet Take 0.5 tablets (12.5 mg total) by mouth daily. Take with or immediately following a meal.   Multiple Vitamins-Minerals (MULTIVITAMIN WITH MINERALS) tablet Take 1 tablet by mouth daily.   rizatriptan (MAXALT) 10 MG tablet Take 10 mg by mouth as needed for migraine. May repeat in 2 hours if needed   valACYclovir (VALTREX) 500 MG tablet Take 500 mg by mouth 2 (two) times daily as needed.     Allergies:   Morphine and  codeine , Dilaudid [hydromorphone], Diphenhydramine, Prednisone, Stadol [butorphanol], Sulfa antibiotics, Talwin [pentazocine], Benadryl [diphenhydramine hcl], Flagyl  [metronidazole ], Melatonin, Solu-medrol  [methylprednisolone ], Tyloxapol, Amitriptyline, Butorphanol tartrate, Oxycodone-acetaminophen , and Promethazine    Social History   Socioeconomic History   Marital status: Divorced    Spouse name: Not on file   Number of children: Not on file   Years of education: Not on file   Highest education level: Not on file  Occupational History   Not on file  Tobacco Use   Smoking status: Never   Smokeless tobacco: Never  Vaping Use   Vaping status: Never Used  Substance and Sexual Activity   Alcohol use: Yes    Comment: very rare  Drug use: No   Sexual activity: Not Currently    Birth control/protection: None  Other Topics Concern   Not on file  Social History Narrative   Not on file   Social Drivers of Health   Financial Resource Strain: Not on file  Food Insecurity: Not on file  Transportation Needs: Not on file  Physical Activity: Not on file  Stress: Not on file  Social Connections: Unknown (04/08/2022)   Received [facility]   Social Network    Social Network: Not on file     Family History: The patient's family history includes Alcoholism in her brother, daughter, daughter, mother, and sister; Breast cancer (age of onset: 72) in her sister; Breast cancer (age of onset: 22) in her sister; Cancer in her father, sister, and sister. ROS:   Please see the history of present illness.    All 14 point review of systems negative except as described per history of present illness.  EKGs/Labs/Other Studies Reviewed:    The following studies were reviewed today:   EKG:  EKG Interpretation Date/Time:  Thursday Mar 28 2024 09:53:46 EDT Ventricular Rate:  72 PR Interval:  114 QRS Duration:  74 QT Interval:  388 QTC Calculation: 424 R  Axis:   80  Text Interpretation: Normal sinus rhythm Normal ECG When compared with ECG of 20-Sep-2017 01:58, PREVIOUS ECG IS PRESENT Confirmed by Bertha Broad reddy [phone]) on 03/28/2024 9:55:50 AM    Recent Labs: No results found for requested labs within last 365 days.  Recent Lipid Panel No results found for: "CHOL", "TRIG", "HDL", "CHOLHDL", "VLDL", "LDLCALC", "LDLDIRECT"  Physical Exam:    VS:  BP 100/70   Pulse 72   Ht 5' 7.5" (1.715 m)   Wt 122 lb (55.3 kg)   SpO2 96%   BMI 18.83 kg/m     Wt Readings from Last 3 Encounters:  03/28/24 122 lb (55.3 kg)  11/08/23 120 lb (54.4 kg)  05/30/22 122 lb (55.3 kg)     GENERAL:  Well nourished, well developed in no acute distress NECK: No JVD; No carotid bruits CARDIAC: RRR, S1 and S2 present, no murmurs, no rubs, no gallops CHEST:  Clear to auscultation without rales, wheezing or rhonchi  Extremities: No pitting pedal edema. Pulses bilaterally symmetric with radial 2+ and dorsalis pedis 2+ NEUROLOGIC:  Alert and oriented x 3  Medication Adjustments/Labs and Tests Ordered: Current medicines are reviewed at length with the patient today.  Concerns regarding medicines are outlined above.  Orders Placed This Encounter  Procedures   CT CORONARY MORPH W/CTA COR W/SCORE W/CA W/CM &/OR WO/CM   Lipid panel   Pro b natriuretic peptide (BNP)   Magnesium   Comprehensive metabolic panel with GFR   EKG 60-AVWU   VAS US  CAROTID   Meds ordered this encounter  Medications   aspirin  EC 81 MG tablet    Sig: Take 1 tablet (81 mg total) by mouth daily. Swallow whole.   metoprolol  succinate (TOPROL -XL) 25 MG 24 hr tablet    Sig: Take 0.5 tablets (12.5 mg total) by mouth daily. Take with or immediately following a meal.    Dispense:  45 tablet    Refill:  3   ivabradine  (CORLANOR) 5 MG TABS tablet    Sig: Take 2 tablets (10 mg total) by mouth as directed. Take two hours before your cardiac ct    Dispense:  2 tablet    Refill:  0  Signed, Lura Sallies, MD, MPH, [facility]. 03/28/2024 12:21 PM    [facility]

## 2024-03-30 LAB — COMPREHENSIVE METABOLIC PANEL WITH GFR
ALT: 23 IU/L (ref 0–32)
AST: 24 IU/L (ref 0–40)
Albumin: 4.5 g/dL (ref 3.8–4.8)
Alkaline Phosphatase: 54 IU/L (ref 44–121)
BUN/Creatinine Ratio: 12 (ref 12–28)
BUN: 14 mg/dL (ref 8–27)
Bilirubin Total: 1.2 mg/dL (ref 0.0–1.2)
CO2: 23 mmol/L (ref 20–29)
Calcium: 9.7 mg/dL (ref 8.7–10.3)
Chloride: 107 mmol/L — ABNORMAL HIGH (ref 96–106)
Creatinine, Ser: 1.13 mg/dL — ABNORMAL HIGH (ref 0.57–1.00)
Globulin, Total: 2.1 g/dL (ref 1.5–4.5)
Glucose: 86 mg/dL (ref 70–99)
Potassium: 5 mmol/L (ref 3.5–5.2)
Sodium: 144 mmol/L (ref 134–144)
Total Protein: 6.6 g/dL (ref 6.0–8.5)
eGFR: 52 mL/min/{1.73_m2} — ABNORMAL LOW (ref >59)

## 2024-03-30 LAB — PRO B NATRIURETIC PEPTIDE: NT-Pro BNP: 272 pg/mL (ref 0–301)

## 2024-03-30 LAB — LIPID PANEL
Chol/HDL Ratio: 2.3 ratio (ref 0.0–4.4)
Cholesterol, Total: 211 mg/dL — ABNORMAL HIGH (ref 100–199)
HDL: 91 mg/dL (ref >39)
LDL Chol Calc (NIH): 107 mg/dL — ABNORMAL HIGH (ref 0–99)
Triglycerides: 73 mg/dL (ref 0–149)
VLDL Cholesterol Cal: 13 mg/dL (ref 5–40)

## 2024-03-30 LAB — MAGNESIUM: Magnesium: 2.3 mg/dL (ref 1.6–2.3)

## 2024-04-01 ENCOUNTER — Telehealth: Payer: Self-pay

## 2024-04-01 ENCOUNTER — Other Ambulatory Visit (HOSPITAL_BASED_OUTPATIENT_CLINIC_OR_DEPARTMENT_OTHER): Payer: Self-pay

## 2024-04-01 MED ORDER — IVABRADINE HCL 5 MG PO TABS
10.0000 mg | ORAL_TABLET | ORAL | 0 refills | Status: DC
  Filled 2024-04-01: qty 2, 1d supply, fill #0

## 2024-04-01 NOTE — Telephone Encounter (Signed)
 Instructions reviewed with pt regarding testing and MyChart message sent too. Pt verbalized understanding and had no additional questions.

## 2024-04-01 NOTE — Telephone Encounter (Signed)
 Pt calling in regards to having a CT for her heart and would like to speak with a nurse. Please advise

## 2024-04-03 ENCOUNTER — Other Ambulatory Visit (HOSPITAL_BASED_OUTPATIENT_CLINIC_OR_DEPARTMENT_OTHER): Payer: Self-pay

## 2024-04-03 ENCOUNTER — Other Ambulatory Visit: Payer: Self-pay

## 2024-04-03 MED ORDER — IVABRADINE HCL 5 MG PO TABS
10.0000 mg | ORAL_TABLET | ORAL | 0 refills | Status: DC
  Filled 2024-04-03: qty 2, 1d supply, fill #0

## 2024-04-08 ENCOUNTER — Other Ambulatory Visit: Payer: Self-pay | Admitting: Family Medicine

## 2024-04-08 ENCOUNTER — Other Ambulatory Visit (HOSPITAL_BASED_OUTPATIENT_CLINIC_OR_DEPARTMENT_OTHER): Payer: Self-pay

## 2024-04-08 DIAGNOSIS — R55 Syncope and collapse: Secondary | ICD-10-CM

## 2024-04-16 DIAGNOSIS — M9904 Segmental and somatic dysfunction of sacral region: Secondary | ICD-10-CM | POA: Diagnosis not present

## 2024-04-16 DIAGNOSIS — M9903 Segmental and somatic dysfunction of lumbar region: Secondary | ICD-10-CM | POA: Diagnosis not present

## 2024-04-16 DIAGNOSIS — M9901 Segmental and somatic dysfunction of cervical region: Secondary | ICD-10-CM | POA: Diagnosis not present

## 2024-04-16 DIAGNOSIS — M5134 Other intervertebral disc degeneration, thoracic region: Secondary | ICD-10-CM | POA: Diagnosis not present

## 2024-04-16 DIAGNOSIS — M9902 Segmental and somatic dysfunction of thoracic region: Secondary | ICD-10-CM | POA: Diagnosis not present

## 2024-04-16 DIAGNOSIS — M5032 Other cervical disc degeneration, mid-cervical region, unspecified level: Secondary | ICD-10-CM | POA: Diagnosis not present

## 2024-04-16 DIAGNOSIS — M5137 Other intervertebral disc degeneration, lumbosacral region with discogenic back pain only: Secondary | ICD-10-CM | POA: Diagnosis not present

## 2024-04-16 DIAGNOSIS — M5136 Other intervertebral disc degeneration, lumbar region with discogenic back pain only: Secondary | ICD-10-CM | POA: Diagnosis not present

## 2024-04-17 NOTE — Telephone Encounter (Signed)
 Pt would like a c/b about testing. Please advise

## 2024-04-17 NOTE — Telephone Encounter (Signed)
 Left vm to return call.

## 2024-04-18 ENCOUNTER — Encounter (HOSPITAL_COMMUNITY): Payer: Self-pay

## 2024-04-19 ENCOUNTER — Telehealth (HOSPITAL_COMMUNITY): Payer: Self-pay | Admitting: *Deleted

## 2024-04-19 NOTE — Telephone Encounter (Signed)
 Attempted to call patient regarding upcoming cardiac CT appointment. Left message on voicemail with name and callback number  Larey Brick RN Navigator Cardiac Imaging [facility] [phone] Office [phone] Cell

## 2024-04-19 NOTE — Telephone Encounter (Signed)
 Left vm to return call.

## 2024-04-20 ENCOUNTER — Ambulatory Visit

## 2024-04-20 DIAGNOSIS — J329 Chronic sinusitis, unspecified: Secondary | ICD-10-CM | POA: Diagnosis not present

## 2024-04-20 DIAGNOSIS — I6782 Cerebral ischemia: Secondary | ICD-10-CM | POA: Diagnosis not present

## 2024-04-20 DIAGNOSIS — R55 Syncope and collapse: Secondary | ICD-10-CM

## 2024-04-23 ENCOUNTER — Ambulatory Visit (HOSPITAL_BASED_OUTPATIENT_CLINIC_OR_DEPARTMENT_OTHER)
Admission: RE | Admit: 2024-04-23 | Discharge: 2024-04-23 | Disposition: A | Discharge status: Home / Self Care | Source: Ambulatory Visit

## 2024-04-23 DIAGNOSIS — I429 Cardiomyopathy, unspecified: Secondary | ICD-10-CM | POA: Diagnosis present

## 2024-04-23 MED ORDER — NITROGLYCERIN 0.4 MG SL SUBL
0.8000 mg | SUBLINGUAL_TABLET | Freq: Once | SUBLINGUAL | Status: AC
  Administered 2024-04-23: 0.8 mg via SUBLINGUAL

## 2024-04-23 MED ORDER — NITROGLYCERIN 0.4 MG SL SUBL
SUBLINGUAL_TABLET | SUBLINGUAL | Status: AC
  Filled 2024-04-23: qty 2

## 2024-04-23 MED ORDER — IOHEXOL 350 MG/ML SOLN
100.0000 mL | Freq: Once | INTRAVENOUS | Status: AC | PRN
  Administered 2024-04-23: 95 mL via INTRAVENOUS

## 2024-04-23 NOTE — Telephone Encounter (Signed)
 Left message for the patient to call back.

## 2024-04-24 ENCOUNTER — Ambulatory Visit: Payer: Self-pay

## 2024-04-24 DIAGNOSIS — I429 Cardiomyopathy, unspecified: Secondary | ICD-10-CM

## 2024-04-25 DIAGNOSIS — I429 Cardiomyopathy, unspecified: Secondary | ICD-10-CM | POA: Diagnosis not present

## 2024-04-25 MED ORDER — DIAZEPAM 5 MG PO TABS
ORAL_TABLET | ORAL | 0 refills | Status: DC
Start: 2024-04-25 — End: 2024-04-25

## 2024-04-25 MED ORDER — DIAZEPAM 5 MG PO TABS: ORAL_TABLET | ORAL | 0 refills | Status: DC

## 2024-04-25 NOTE — Telephone Encounter (Signed)
 Pt states she going to have her blood work done today and she would also like a script sent to the pharmacy for Valium  10 Mgs in order for her to complete testing tomorrow. Please advise

## 2024-04-25 NOTE — Telephone Encounter (Signed)
 Left vm for pt to return call.  RX called in to [facility].

## 2024-04-25 NOTE — Addendum Note (Signed)
 Addended by: Einar Grave on: 04/25/2024 03:11 PM   Modules accepted: Orders

## 2024-04-26 ENCOUNTER — Other Ambulatory Visit: Payer: Self-pay

## 2024-04-26 ENCOUNTER — Ambulatory Visit (HOSPITAL_COMMUNITY)
Admission: RE | Admit: 2024-04-26 | Discharge: 2024-04-26 | Disposition: A | Discharge status: Home / Self Care | Source: Ambulatory Visit

## 2024-04-26 DIAGNOSIS — I429 Cardiomyopathy, unspecified: Secondary | ICD-10-CM

## 2024-04-26 LAB — HEMOGLOBIN AND HEMATOCRIT, BLOOD
Hematocrit: 43.6 % (ref 34.0–46.6)
Hemoglobin: 15 g/dL (ref 11.1–15.9)

## 2024-04-26 MED ORDER — GADOBUTROL 1 MMOL/ML IV SOLN
10.0000 mL | Freq: Once | INTRAVENOUS | Status: AC | PRN
  Administered 2024-04-26: 10 mL via INTRAVENOUS

## 2024-04-29 ENCOUNTER — Ambulatory Visit (HOSPITAL_BASED_OUTPATIENT_CLINIC_OR_DEPARTMENT_OTHER)
Admission: RE | Admit: 2024-04-29 | Discharge: 2024-04-29 | Disposition: A | Discharge status: Home / Self Care | Source: Ambulatory Visit

## 2024-04-29 DIAGNOSIS — R55 Syncope and collapse: Secondary | ICD-10-CM

## 2024-04-30 ENCOUNTER — Ambulatory Visit: Payer: Self-pay

## 2024-05-01 DIAGNOSIS — M9901 Segmental and somatic dysfunction of cervical region: Secondary | ICD-10-CM | POA: Diagnosis not present

## 2024-05-01 DIAGNOSIS — M5137 Other intervertebral disc degeneration, lumbosacral region with discogenic back pain only: Secondary | ICD-10-CM | POA: Diagnosis not present

## 2024-05-01 DIAGNOSIS — M9904 Segmental and somatic dysfunction of sacral region: Secondary | ICD-10-CM | POA: Diagnosis not present

## 2024-05-01 DIAGNOSIS — M9903 Segmental and somatic dysfunction of lumbar region: Secondary | ICD-10-CM | POA: Diagnosis not present

## 2024-05-01 DIAGNOSIS — M9902 Segmental and somatic dysfunction of thoracic region: Secondary | ICD-10-CM | POA: Diagnosis not present

## 2024-05-01 DIAGNOSIS — M5032 Other cervical disc degeneration, mid-cervical region, unspecified level: Secondary | ICD-10-CM | POA: Diagnosis not present

## 2024-05-01 DIAGNOSIS — M5134 Other intervertebral disc degeneration, thoracic region: Secondary | ICD-10-CM | POA: Diagnosis not present

## 2024-05-01 DIAGNOSIS — M5136 Other intervertebral disc degeneration, lumbar region with discogenic back pain only: Secondary | ICD-10-CM | POA: Diagnosis not present

## 2024-05-10 ENCOUNTER — Ambulatory Visit: Payer: Self-pay

## 2024-05-13 DIAGNOSIS — M5022 Other cervical disc displacement, mid-cervical region, unspecified level: Secondary | ICD-10-CM | POA: Diagnosis not present

## 2024-05-13 DIAGNOSIS — M9903 Segmental and somatic dysfunction of lumbar region: Secondary | ICD-10-CM | POA: Diagnosis not present

## 2024-05-13 DIAGNOSIS — M9902 Segmental and somatic dysfunction of thoracic region: Secondary | ICD-10-CM | POA: Diagnosis not present

## 2024-05-13 DIAGNOSIS — M5124 Other intervertebral disc displacement, thoracic region: Secondary | ICD-10-CM | POA: Diagnosis not present

## 2024-05-13 DIAGNOSIS — M9901 Segmental and somatic dysfunction of cervical region: Secondary | ICD-10-CM | POA: Diagnosis not present

## 2024-05-13 DIAGNOSIS — M5032 Other cervical disc degeneration, mid-cervical region, unspecified level: Secondary | ICD-10-CM | POA: Diagnosis not present

## 2024-05-14 DIAGNOSIS — Z1331 Encounter for screening for depression: Secondary | ICD-10-CM | POA: Diagnosis not present

## 2024-05-14 DIAGNOSIS — Z0000 Encounter for general adult medical examination without abnormal findings: Secondary | ICD-10-CM | POA: Diagnosis not present

## 2024-05-15 DIAGNOSIS — M5022 Other cervical disc displacement, mid-cervical region, unspecified level: Secondary | ICD-10-CM | POA: Diagnosis not present

## 2024-05-15 DIAGNOSIS — M9901 Segmental and somatic dysfunction of cervical region: Secondary | ICD-10-CM | POA: Diagnosis not present

## 2024-05-15 DIAGNOSIS — M5124 Other intervertebral disc displacement, thoracic region: Secondary | ICD-10-CM | POA: Diagnosis not present

## 2024-05-15 DIAGNOSIS — M9902 Segmental and somatic dysfunction of thoracic region: Secondary | ICD-10-CM | POA: Diagnosis not present

## 2024-05-15 DIAGNOSIS — M9903 Segmental and somatic dysfunction of lumbar region: Secondary | ICD-10-CM | POA: Diagnosis not present

## 2024-05-15 DIAGNOSIS — M5032 Other cervical disc degeneration, mid-cervical region, unspecified level: Secondary | ICD-10-CM | POA: Diagnosis not present

## 2024-05-18 ENCOUNTER — Other Ambulatory Visit

## 2024-05-20 ENCOUNTER — Telehealth: Payer: Self-pay

## 2024-05-20 NOTE — Telephone Encounter (Signed)
 Received the following message below from Dr. Liborio:  We can review this specifically where you are mentioning about being hypertension on the CT scan.  I did not find the specific area that you are referring to.   MRI of the brain results that are currently pending for you and still not resulted.  Please do check back with your primary care with regards to this.  This was not ordered by me, we will see if my office can reach out to radiology department to have them address this for you.   Will go over your test results extensively at your follow-up visit coming up and answer any questions you have.  Your ultrasound carotid exam results from yesterday showed no major abnormalities.  Thank you  Called the patient to inform her of Dr. Madireddy's recommendation. Patient did not answer the phone and a message was left for her to call back.

## 2024-05-22 ENCOUNTER — Ambulatory Visit

## 2024-05-28 ENCOUNTER — Ambulatory Visit

## 2024-05-28 DIAGNOSIS — M5032 Other cervical disc degeneration, mid-cervical region, unspecified level: Secondary | ICD-10-CM | POA: Diagnosis not present

## 2024-05-28 DIAGNOSIS — M9902 Segmental and somatic dysfunction of thoracic region: Secondary | ICD-10-CM | POA: Diagnosis not present

## 2024-05-28 DIAGNOSIS — M9903 Segmental and somatic dysfunction of lumbar region: Secondary | ICD-10-CM | POA: Diagnosis not present

## 2024-05-28 DIAGNOSIS — M5022 Other cervical disc displacement, mid-cervical region, unspecified level: Secondary | ICD-10-CM | POA: Diagnosis not present

## 2024-05-28 DIAGNOSIS — M5124 Other intervertebral disc displacement, thoracic region: Secondary | ICD-10-CM | POA: Diagnosis not present

## 2024-05-28 DIAGNOSIS — M9901 Segmental and somatic dysfunction of cervical region: Secondary | ICD-10-CM | POA: Diagnosis not present

## 2024-05-29 DIAGNOSIS — S61210A Laceration without foreign body of right index finger without damage to nail, initial encounter: Secondary | ICD-10-CM | POA: Diagnosis not present

## 2024-05-29 DIAGNOSIS — W25XXXA Contact with sharp glass, initial encounter: Secondary | ICD-10-CM | POA: Diagnosis not present

## 2024-06-13 DIAGNOSIS — M7918 Myalgia, other site: Secondary | ICD-10-CM | POA: Diagnosis not present

## 2024-06-13 DIAGNOSIS — M549 Dorsalgia, unspecified: Secondary | ICD-10-CM | POA: Diagnosis not present

## 2024-06-19 ENCOUNTER — Ambulatory Visit: Attending: Physician Assistant | Admitting: Rehabilitative and Restorative Service Providers"

## 2024-06-19 ENCOUNTER — Encounter: Payer: Self-pay | Admitting: Rehabilitative and Restorative Service Providers"

## 2024-06-19 ENCOUNTER — Other Ambulatory Visit (HOSPITAL_COMMUNITY)

## 2024-06-19 ENCOUNTER — Other Ambulatory Visit: Payer: Self-pay

## 2024-06-19 DIAGNOSIS — R293 Abnormal posture: Secondary | ICD-10-CM | POA: Insufficient documentation

## 2024-06-19 DIAGNOSIS — M6281 Muscle weakness (generalized): Secondary | ICD-10-CM | POA: Insufficient documentation

## 2024-06-19 DIAGNOSIS — R29898 Other symptoms and signs involving the musculoskeletal system: Secondary | ICD-10-CM | POA: Diagnosis not present

## 2024-06-19 DIAGNOSIS — M545 Low back pain, unspecified: Secondary | ICD-10-CM | POA: Insufficient documentation

## 2024-06-19 NOTE — Therapy (Signed)
 OUTPATIENT PHYSICAL THERAPY THORACOLUMBAR EVALUATION   Patient Name: Mary Freeman MRN: [id number] DOB:1951/08/16, 73 y.o., female Today's Date: 06/19/2024  END OF SESSION:  PT End of Session - 06/19/24 1313     Visit Number 1    Number of Visits 16    Date for PT Re-Evaluation 08/14/24    Authorization Type [employer] medicare copay $10    Progress Note Due on Visit 10    PT Start Time 1145    PT Stop Time 1230    PT Time Calculation (min) 45 min    Activity Tolerance Patient tolerated treatment well          Past Medical History:  Diagnosis Date   Chronic kidney disease, stage 3 (HCC)    Depression    Headache    migraines   Hypotension    NSVT (nonsustained ventricular tachycardia) (HCC)    PONV (postoperative nausea and vomiting)    SVT (supraventricular tachycardia) (HCC)    Syncope and collapse    Past Surgical History:  Procedure Laterality Date   APPENDECTOMY     BREAST EXCISIONAL BIOPSY Right    benign   COLONOSCOPY     KNEE ARTHROSCOPY Right    x3   KNEE ARTHROSCOPY W/ MENISCAL REPAIR Left    LUMBAR LAMINECTOMY  1986   REDUCTION MAMMAPLASTY Bilateral    2006   ROTATOR CUFF REPAIR Right 2012   TONSILLECTOMY     TUBAL LIGATION     Patient Active Problem List   Diagnosis Date Noted   Palpitations 03/28/2024   Cardiomyopathy (HCC) 03/28/2024   Syncope 03/28/2024   Chronic kidney disease, stage 3 (HCC)    Anxiety 02/11/2021   Adjustment disorder with depressed mood 02/11/2021   Chronic kidney disease, stage 3 unspecified (HCC) 02/11/2021   Collagenous colitis 02/11/2021   Gastroesophageal reflux disease 02/11/2021   Migraine without aura, not refractory 02/11/2021   Mild depression 02/11/2021   Osteopenia 02/11/2021   Early satiety 01/12/2021   Bloating 01/12/2021   Abdominal pain 01/12/2021   Chondromalacia patellae 06/11/2012   Status post arthroscopy of shoulder 04/16/2012   Rotator cuff tear 12/28/2011   Rotator cuff tendinitis 12/28/2011    Disorder of bursae and tendons in shoulder region 09/19/2011   Golfer's elbow 09/19/2011    PCP: Mabel Pleas, PA   REFERRING PROVIDER: Mabel Pleas, PA  REFERRING DIAG: Gluteal pain; midback pain  Rationale for Evaluation and Treatment: Rehabilitation  THERAPY DIAG:  Acute left-sided low back pain without sciatica  Other symptoms and signs involving the musculoskeletal system  Abnormal posture  Muscle weakness (generalized)  ONSET DATE: 12/13/23  SUBJECTIVE:  SUBJECTIVE STATEMENT: Patient reports that she has had pain in the L LB area for the past 6 months which was treated with chiropractic care. Since May the chiropractic care has not relieved symptoms. She has no known injury. She started having pain in the L glut in the past 6 weeks. She is improving but continues to have some pain, tingling in the L ankle and tightness in the L leg   PERTINENT HISTORY:  Lt shoulder scope RC, Lt 05/25/17 DCR/SAD/frayed RC/torn labrum; Rt shoulder pain; cervical pain 2018; LBP 2020; Rt knee pain 2022; low blood pressure now controlled with meds   PAIN:  Are you having pain? Yes: NPRS scale: 2/10; best in past week 2/10; worst 5/10  Pain location: L low to mid back; L glut  Pain description: ache Aggravating factors: mowing; vacuuming Relieving factors: ice; heat; hot bath  PRECAUTIONS: None  RED FLAGS: None   WEIGHT BEARING RESTRICTIONS: No  FALLS:  Has patient fallen in last 6 months? No  LIVING ENVIRONMENT: Lives with: lives alone Lives in: house Stairs: Yes: External: 1 steps; none; Internal steps 14; rail on R going up   OCCUPATION: retired Engineer, civil (consulting); active with household chores; yard work; yoga 2x/wk; Mudlogger exercises 2x/wk   PLOF: Independent  PATIENT GOALS: get rid of the pain   NEXT  MD VISIT: 8/25  OBJECTIVE:  Note: Objective measures were completed at Evaluation unless otherwise noted.  DIAGNOSTIC FINDINGS:  None available for back and hip   PATIENT SURVEYS:  PSFS: THE PATIENT SPECIFIC FUNCTIONAL SCALE  Place score of 0-10 (0 = unable to perform activity and 10 = able to perform activity at the same level as before injury or problem)  Activity Date: 06/19/24    Vacuum  5    2. Rake  0    3. Lifting > 2 pounds  0    4.      Total Score 5      Total Score = Sum of activity scores/number of activities 5/3 = 1.67   Minimally Detectable Change: 3 points (for single activity); 2 points (for average score)  [facility] (nd). The Patient Specific Functional Scale . Retrieved from [email or web address]   COGNITION: Overall cognitive status: Within functional limits for tasks assessed     SENSATION: Tingling in L ankle on an interment basis   MUSCLE LENGTH: Hamstrings: Right 65 deg; Left 70 deg Thomas test: WFL's  POSTURE: rounded shoulders, forward head, and increased thoracic kyphosis  PALPATION: tight L lats mid to low back; L gluts/piriformis    LUMBAR ROM:   AROM eval  Flexion 70% tight   Extension   Right lateral flexion 70% tight   Left lateral flexion   Right rotation   Left rotation    (Blank rows = not tested)  LOWER EXTREMITY ROM:   WFL's bilat   Active  Right eval Left eval  Hip flexion    Hip extension    Hip abduction    Hip adduction    Hip internal rotation    Hip external rotation    Knee flexion    Knee extension    Ankle dorsiflexion    Ankle plantarflexion    Ankle inversion    Ankle eversion     (Blank rows = not tested)  LOWER EXTREMITY MMT:    MMT Right eval Left eval  Hip flexion 5 4  Hip extension 5 4+  Hip abduction 5 4  Hip adduction  Hip internal rotation    Hip external rotation    Knee flexion    Knee extension    Ankle  dorsiflexion    Ankle plantarflexion    Ankle inversion    Ankle eversion     (Blank rows = not tested)  LUMBAR SPECIAL TESTS:  Straight leg raise test: Negative and Slump test: Negative  FUNCTIONAL TESTS:   Single leg stance: R 10; L 2-3 sec   GAIT: Distance walked: 40 ft  Assistive device utilized: None Level of assistance: Complete Independence Comments: WFL's   TREATMENT DATE: 06/19/24 Evaluation; POC; HEP                                                                                                                                  PATIENT EDUCATION:  Education details: POC; HEP  Person educated: Patient Education method: Programmer, multimedia, Demonstration, Tactile cues, Verbal cues, and Handouts Education comprehension: verbalized understanding, returned demonstration, verbal cues required, tactile cues required, and needs further education  HOME EXERCISE PROGRAM: Access Code: [email or web address] URL: [email or web address] Date: 06/19/2024 Prepared by: Ladene Allocca  Program Notes Lat stretch lying on back pull knees to chest hold with abdominal muscles or prop feet turn hands with palms facing your facepush hands over head keeping elbows toward each other relax arms overhead 30- 45 sec hold 3 reps   Exercises - Supine Piriformis Stretch with Leg Straight  - 2 x daily - 7 x weekly - 1 sets - 3 reps - 30 sec  hold - Supine Pelvic Floor Stretch  - 2 x daily - 7 x weekly - Hooklying Hamstring Stretch with Strap  - 2 x daily - 7 x weekly - 1 sets - 3 reps - 30 sec  hold - Supine Sciatic Nerve Glide  - 2 x daily - 7 x weekly - 1 sets - 8-10 reps - 1-2 sec  hold  ASSESSMENT:  CLINICAL IMPRESSION: Patient is a 73 y.o. female who was seen today for physical therapy evaluation and treatment for L mid to low back and L posterior hip pain. Back pain has been present for ~ 6 months with increased pain in the L posterior hip/glut area in the past 2 months. Patient has pain with  lifting, vacuuming and raking. She has tightness to palpation through the L latissimus dorsi; QL; gluts; piriformis. She has positive neural tension test L LE. Patient has limited trunk mobility and pain with functional activities as at times at rest. Patient responded well to stretch for lats and LE in treatment today. She will benefit from PT to address problems identified.   OBJECTIVE IMPAIRMENTS: decreased activity tolerance, decreased balance, decreased ROM, decreased strength, increased fascial restrictions, improper body mechanics, postural dysfunction, and pain.   ACTIVITY LIMITATIONS: carrying, lifting, bending, sitting, standing, squatting, sleeping, and stairs  PARTICIPATION LIMITATIONS: cleaning, laundry, community activity, and yard work  PERSONAL FACTORS: Time since onset of injury/illness/exacerbation are also affecting  patient's functional outcome.   REHAB POTENTIAL: Good  CLINICAL DECISION MAKING: Evolving/moderate complexity  EVALUATION COMPLEXITY: Moderate   GOALS: Goals reviewed with patient? Yes  SHORT TERM GOALS: Target date: 07/17/2024   Decrease pain in L mid to low back allowing patient to move through normal functional positional changes without limitations. Baseline: Goal status: INITIAL  2.  Increase trunk ROM/mobility to [facility] and pain free  Baseline:  Goal status: INITIAL  3.  Patient independent in initial HEP  Baseline:  Goal status: INITIAL   LONG TERM GOALS: Target date: 08/14/2024   Patient reports decreased back and hip pain by 75-100% allowing her to return to all normal activities  Baseline:  Goal status: INITIAL  2.  5/5 strength L LE  Baseline:  Goal status: INITIAL  3.  Patient reports vacuuming with minimal to no increase in L back or hip pain  Baseline:  Goal status: INITIAL  4.  Patient reports ability to rake with minimal to no increase in back or hip pain > 1-2/10  Baseline:  Goal status: INITIAL  5.  Patient  demonstrates and/or reports ability to lift 5 pounds with no increase in back or hip pain  Baseline:  Goal status: INITIAL  6.  PSFS: THE PATIENT SPECIFIC FUNCTIONAL SCALE  Place score of 0-10 (0 = unable to perform activity and 10 = able to perform activity at the same level as before injury or problem)  Activity Date: 06/19/24    Vacuum  5    2. Rake  0    3. Lifting > 2 pounds  0    4.      Total Score 5      Total Score = Sum of activity scores/number of activities 5/3 = 1.67  Baseline: 1.67 Goal status: INITIAL  7. Independent in advanced HEP   Baseline:   Coal status: INITIAL   PLAN:  PT FREQUENCY: 2x/week  PT DURATION: 8 weeks  PLANNED INTERVENTIONS: 97164- PT Re-evaluation, 97110-Therapeutic exercises, 97530- Therapeutic activity, [id number]- Neuromuscular re-education, 97535- Self Care, [id number]- Manual therapy, Patient/Family education, Balance training, Stair training, Taping, and Joint mobilization.  PLAN FOR NEXT SESSION: review and progress with exercise; continue with spine care education and ergonomic correction; manual work and modalities as indicated    Ruven Corradi P Lucion Dilger, PT 06/19/2024, 1:33 PM

## 2024-06-20 ENCOUNTER — Ambulatory Visit: Admitting: Rehabilitative and Restorative Service Providers"

## 2024-06-25 DIAGNOSIS — C44729 Squamous cell carcinoma of skin of left lower limb, including hip: Secondary | ICD-10-CM | POA: Diagnosis not present

## 2024-06-25 DIAGNOSIS — Z85828 Personal history of other malignant neoplasm of skin: Secondary | ICD-10-CM | POA: Diagnosis not present

## 2024-06-25 DIAGNOSIS — L570 Actinic keratosis: Secondary | ICD-10-CM | POA: Diagnosis not present

## 2024-06-25 DIAGNOSIS — L814 Other melanin hyperpigmentation: Secondary | ICD-10-CM | POA: Diagnosis not present

## 2024-06-25 DIAGNOSIS — L821 Other seborrheic keratosis: Secondary | ICD-10-CM | POA: Diagnosis not present

## 2024-06-25 DIAGNOSIS — D225 Melanocytic nevi of trunk: Secondary | ICD-10-CM | POA: Diagnosis not present

## 2024-06-25 DIAGNOSIS — D485 Neoplasm of uncertain behavior of skin: Secondary | ICD-10-CM | POA: Diagnosis not present

## 2024-06-27 ENCOUNTER — Encounter: Payer: Self-pay | Admitting: Rehabilitative and Restorative Service Providers"

## 2024-06-27 ENCOUNTER — Ambulatory Visit: Admitting: Rehabilitative and Restorative Service Providers"

## 2024-06-27 DIAGNOSIS — M6281 Muscle weakness (generalized): Secondary | ICD-10-CM

## 2024-06-27 DIAGNOSIS — M545 Low back pain, unspecified: Secondary | ICD-10-CM | POA: Diagnosis not present

## 2024-06-27 DIAGNOSIS — R29898 Other symptoms and signs involving the musculoskeletal system: Secondary | ICD-10-CM | POA: Diagnosis not present

## 2024-06-27 DIAGNOSIS — R293 Abnormal posture: Secondary | ICD-10-CM

## 2024-06-27 NOTE — Therapy (Signed)
 OUTPATIENT PHYSICAL THERAPY THORACOLUMBAR TREATMENT   Patient Name: Mary Freeman MRN: [id number] DOB:[birth year], 73 y.o., female Today's Date: 06/27/2024  END OF SESSION:  PT End of Session - 06/27/24 1406     Visit Number 2    Number of Visits 16    Date for PT Re-Evaluation 08/14/24    Authorization Type [employer] medicare copay $10    Progress Note Due on Visit 10    PT Start Time 1400    PT Stop Time 1445    PT Time Calculation (min) 45 min    Activity Tolerance Patient tolerated treatment well          Past Medical History:  Diagnosis Date   Chronic kidney disease, stage 3 (HCC)    Depression    Headache    migraines   Hypotension    NSVT (nonsustained ventricular tachycardia) (HCC)    PONV (postoperative nausea and vomiting)    SVT (supraventricular tachycardia) (HCC)    Syncope and collapse    Past Surgical History:  Procedure Laterality Date   APPENDECTOMY     BREAST EXCISIONAL BIOPSY Right    benign   COLONOSCOPY     KNEE ARTHROSCOPY Right    x3   KNEE ARTHROSCOPY W/ MENISCAL REPAIR Left    LUMBAR LAMINECTOMY  1986   REDUCTION MAMMAPLASTY Bilateral    2006   ROTATOR CUFF REPAIR Right 2012   TONSILLECTOMY     TUBAL LIGATION     Patient Active Problem List   Diagnosis Date Noted   Palpitations 03/28/2024   Cardiomyopathy (HCC) 03/28/2024   Syncope 03/28/2024   Chronic kidney disease, stage 3 (HCC)    Anxiety 02/11/2021   Adjustment disorder with depressed mood 02/11/2021   Chronic kidney disease, stage 3 unspecified (HCC) 02/11/2021   Collagenous colitis 02/11/2021   Gastroesophageal reflux disease 02/11/2021   Migraine without aura, not refractory 02/11/2021   Mild depression 02/11/2021   Osteopenia 02/11/2021   Early satiety 01/12/2021   Bloating 01/12/2021   Abdominal pain 01/12/2021   Chondromalacia patellae 06/11/2012   Status post arthroscopy of shoulder 04/16/2012   Rotator cuff tear 12/28/2011   Rotator cuff tendinitis 12/28/2011    Disorder of bursae and tendons in shoulder region 09/19/2011   Golfer's elbow 09/19/2011    PCP: Mabel Pleas, PA   REFERRING PROVIDER: Mabel Pleas, PA  REFERRING DIAG: Gluteal pain; midback pain  Rationale for Evaluation and Treatment: Rehabilitation  THERAPY DIAG:  Acute left-sided low back pain without sciatica  Other symptoms and signs involving the musculoskeletal system  Abnormal posture  Muscle weakness (generalized)  ONSET DATE: 12/13/23  SUBJECTIVE:  SUBJECTIVE STATEMENT: Improving - less back and leg pain. She has some pain in the L LB at times with certain movements. Leg just feels funny. Can tell she is getting better.   Eval: Patient reports that she has had pain in the L LB area for the past 6 months which was treated with chiropractic care. Since May the chiropractic care has not relieved symptoms. She has no known injury. She started having pain in the L glut in the past 6 weeks. She is improving but continues to have some pain, tingling in the L ankle and tightness in the L leg   PERTINENT HISTORY:  Lt shoulder scope RC, Lt 05/25/17 DCR/SAD/frayed RC/torn labrum; Rt shoulder pain; cervical pain 2018; LBP 2020; Rt knee pain 2022; low blood pressure now controlled with meds   PAIN:  Are you having pain? Yes: NPRS scale: 0/10; best in past week 2/10; worst 5/10  Pain location: L low to mid back; L glut  Pain description: ache Aggravating factors: mowing; vacuuming Relieving factors: ice; heat; hot bath  PRECAUTIONS: None   WEIGHT BEARING RESTRICTIONS: No  FALLS:  Has patient fallen in last 6 months? No  LIVING ENVIRONMENT: Lives with: lives alone Lives in: house Stairs: Yes: External: 1 steps; none; Internal steps 14; rail on R going up   OCCUPATION: retired Engineer, civil (consulting);  active with household chores; yard work; yoga 2x/wk; lift exercises 2x/wk   PATIENT GOALS: get rid of the pain   NEXT MD VISIT: 8/25  OBJECTIVE:  Note: Objective measures were completed at Evaluation unless otherwise noted.  DIAGNOSTIC FINDINGS:  None available for back and hip   PATIENT SURVEYS:  PSFS: THE PATIENT SPECIFIC FUNCTIONAL SCALE  Place score of 0-10 (0 = unable to perform activity and 10 = able to perform activity at the same level as before injury or problem)  Activity Date: 06/19/24    Vacuum  5    2. Rake  0    3. Lifting > 2 pounds  0    4.      Total Score 5      Total Score = Sum of activity scores/number of activities 5/3 = 1.67   Minimally Detectable Change: 3 points (for single activity); 2 points (for average score)  [facility] (nd). The Patient Specific Functional Scale . Retrieved from [email or web address]    SENSATION: Tingling in L ankle on an interment basis   MUSCLE LENGTH: Hamstrings: Right 65 deg; Left 70 deg Thomas test: WFL's  POSTURE: rounded shoulders, forward head, and increased thoracic kyphosis  PALPATION: tight L lats mid to low back; L gluts/piriformis    LUMBAR ROM:   AROM eval  Flexion 70% tight   Extension   Right lateral flexion 70% tight   Left lateral flexion   Right rotation   Left rotation    (Blank rows = not tested)  LOWER EXTREMITY ROM:   WFL's bilat   Active  Right eval Left eval  Hip flexion    Hip extension    Hip abduction    Hip adduction    Hip internal rotation    Hip external rotation    Knee flexion    Knee extension    Ankle dorsiflexion    Ankle plantarflexion    Ankle inversion    Ankle eversion     (Blank rows = not tested)  LOWER EXTREMITY MMT:    MMT Right eval Left eval  Hip flexion 5 4  Hip extension  5 4+  Hip abduction 5 4  Hip adduction    Hip internal rotation    Hip external rotation    Knee flexion     Knee extension    Ankle dorsiflexion    Ankle plantarflexion    Ankle inversion    Ankle eversion     (Blank rows = not tested)  LUMBAR SPECIAL TESTS:  Straight leg raise test: Negative and Slump test: Negative  FUNCTIONAL TESTS:   Single leg stance: R 10; L 2-3 sec   GAIT: Distance walked: 40 ft  Assistive device utilized: None Level of assistance: Complete Independence Comments: WFL's   [facility] Adult PT Treatment:                                                DATE: 06/27/24 Therapeutic Exercise: Supine  Piriformis stretch travell 30 sec x 3 Hamstring stretch with strap 30 sec x 3  Hip adductor stretch with strap hooklying 30 sec x 3 Quadruped  Cat cow x 2  Child's pose 20 sec x 3  Sitting Standing  Manual Therapy:  STM and MFR L lumbar/thoracic musculature along lats to shoulder  Therapeutic Activity:  Standing   Row blue TB 3 sec x 10 x 2    Lat pull blue TB sec x 10 x 2  Modalities:  Self Care: Avoid lifting heavy objects Continue with consistent exercises    TREATMENT DATE: 06/19/24 Evaluation; POC; HEP                                                                                                                                  PATIENT EDUCATION:  Education details: POC; HEP  Person educated: Patient Education method: Explanation, Demonstration, Tactile cues, Verbal cues, and Handouts Education comprehension: verbalized understanding, returned demonstration, verbal cues required, tactile cues required, and needs further education  HOME EXERCISE PROGRAM: Access Code: [email or web address] URL: [email or web address] Date: 06/27/2024 Prepared by: Murielle Stang  Program Notes Lat stretch lying on back pull knees to chest hold with abdominal muscles or prop feet turn hands with palms facing your facepush hands over head keeping elbows toward each other relax arms overhead 30- 45 sec hold 3 reps   Exercises - Supine Piriformis Stretch with Leg Straight   - 2 x daily - 7 x weekly - 1 sets - 3 reps - 30 sec  hold - Supine Pelvic Floor Stretch  - 2 x daily - 7 x weekly - Hooklying Hamstring Stretch with Strap  - 2 x daily - 7 x weekly - 1 sets - 3 reps - 30 sec  hold - Supine Sciatic Nerve Glide  - 2 x daily - 7 x weekly - 1 sets - 8-10 reps - 1-2 sec  hold - Hip Adductors and Hamstring Stretch with  Strap  - 2 x daily - 7 x weekly - 1 sets - 3 reps - 30 sec  hold - Cat Cow  - 2 x daily - 7 x weekly - 1 sets - 5-10 reps - 3-5 sec  hold - Cat Cow to Child's Pose  - 2 x daily - 7 x weekly - 1 sets - 3 reps - 30 sec  hold - Standing Bilateral Low Shoulder Row with Anchored Resistance  - 2 x daily - 7 x weekly - 1-3 sets - 10 reps - 2-3 sec  hold - Shoulder extension with resistance - Neutral  - 1 x daily - 7 x weekly - 1-2 sets - 10 reps - 3-5 sec  hold  ASSESSMENT:  CLINICAL IMPRESSION: Improvement in back and L LE pain. Reviewed and progressed exercises focus on stretching and core strengthening. Tolerated exercises well. Trial of manual work including myofacial release through lats and posterior thoracolumbar musculature into posterior shoulder.    Eval: Patient is a 73 y.o. female who was seen today for physical therapy evaluation and treatment for L mid to low back and L posterior hip pain. Back pain has been present for ~ 6 months with increased pain in the L posterior hip/glut area in the past 2 months. Patient has pain with lifting, vacuuming and raking. She has tightness to palpation through the L latissimus dorsi; QL; gluts; piriformis. She has positive neural tension test L LE. Patient has limited trunk mobility and pain with functional activities as at times at rest. Patient responded well to stretch for lats and LE in treatment today. She will benefit from PT to address problems identified.    GOALS: Goals reviewed with patient? Yes  SHORT TERM GOALS: Target date: 07/17/2024   Decrease pain in L mid to low back allowing patient to move  through normal functional positional changes without limitations. Baseline: Goal status: INITIAL  2.  Increase trunk ROM/mobility to [facility] and pain free  Baseline:  Goal status: INITIAL  3.  Patient independent in initial HEP  Baseline:  Goal status: INITIAL   LONG TERM GOALS: Target date: 08/14/2024   Patient reports decreased back and hip pain by 75-100% allowing her to return to all normal activities  Baseline:  Goal status: INITIAL  2.  5/5 strength L LE  Baseline:  Goal status: INITIAL  3.  Patient reports vacuuming with minimal to no increase in L back or hip pain  Baseline:  Goal status: INITIAL  4.  Patient reports ability to rake with minimal to no increase in back or hip pain > 1-2/10  Baseline:  Goal status: INITIAL  5.  Patient demonstrates and/or reports ability to lift 5 pounds with no increase in back or hip pain  Baseline:  Goal status: INITIAL  6.  PSFS: THE PATIENT SPECIFIC FUNCTIONAL SCALE  Place score of 0-10 (0 = unable to perform activity and 10 = able to perform activity at the same level as before injury or problem)  Activity Date: 06/19/24    Vacuum  5    2. Rake  0    3. Lifting > 2 pounds  0    4.      Total Score 5      Total Score = Sum of activity scores/number of activities 5/3 = 1.67  Baseline: 1.67 Goal status: INITIAL  7. Independent in advanced HEP   Baseline:   Coal status: INITIAL   PLAN:  PT FREQUENCY: 2x/week  PT DURATION: 8 weeks  PLANNED INTERVENTIONS: 97164- PT Re-evaluation, 97110-Therapeutic exercises, 97530- Therapeutic activity, [id number]- Neuromuscular re-education, 97535- Self Care, [id number]- Manual therapy, Patient/Family education, Balance training, Stair training, Taping, and Joint mobilization.  PLAN FOR NEXT SESSION: review and progress with exercise; continue with spine care education and ergonomic correction; manual work and modalities as indicated    Surie Suchocki P Vrinda Heckstall, PT 06/27/2024, 2:06 PM

## 2024-07-01 ENCOUNTER — Ambulatory Visit: Admitting: Rehabilitative and Restorative Service Providers"

## 2024-07-04 ENCOUNTER — Encounter: Payer: Self-pay | Admitting: Rehabilitative and Restorative Service Providers"

## 2024-07-04 ENCOUNTER — Ambulatory Visit: Attending: Physician Assistant | Admitting: Rehabilitative and Restorative Service Providers"

## 2024-07-04 DIAGNOSIS — R29898 Other symptoms and signs involving the musculoskeletal system: Secondary | ICD-10-CM | POA: Insufficient documentation

## 2024-07-04 DIAGNOSIS — R293 Abnormal posture: Secondary | ICD-10-CM | POA: Diagnosis not present

## 2024-07-04 DIAGNOSIS — M6281 Muscle weakness (generalized): Secondary | ICD-10-CM | POA: Diagnosis not present

## 2024-07-04 DIAGNOSIS — M545 Low back pain, unspecified: Secondary | ICD-10-CM | POA: Insufficient documentation

## 2024-07-04 NOTE — Therapy (Signed)
 OUTPATIENT PHYSICAL THERAPY THORACOLUMBAR TREATMENT   Patient Name: Mary Freeman MRN: [id number] DOB:06-13-1951, 73 y.o., female Today's Date: 07/04/2024  END OF SESSION:  PT End of Session - 07/04/24 1449     Visit Number 3    Number of Visits 16    Date for PT Re-Evaluation 08/14/24    Authorization Type [employer] medicare copay $10    Progress Note Due on Visit 10    PT Start Time 1445    PT Stop Time 1530    PT Time Calculation (min) 45 min    Activity Tolerance Patient tolerated treatment well          Past Medical History:  Diagnosis Date   Chronic kidney disease, stage 3 (HCC)    Depression    Headache    migraines   Hypotension    NSVT (nonsustained ventricular tachycardia) (HCC)    PONV (postoperative nausea and vomiting)    SVT (supraventricular tachycardia) (HCC)    Syncope and collapse    Past Surgical History:  Procedure Laterality Date   APPENDECTOMY     BREAST EXCISIONAL BIOPSY Right    benign   COLONOSCOPY     KNEE ARTHROSCOPY Right    x3   KNEE ARTHROSCOPY W/ MENISCAL REPAIR Left    LUMBAR LAMINECTOMY  1986   REDUCTION MAMMAPLASTY Bilateral    2006   ROTATOR CUFF REPAIR Right 2012   TONSILLECTOMY     TUBAL LIGATION     Patient Active Problem List   Diagnosis Date Noted   Palpitations 03/28/2024   Cardiomyopathy (HCC) 03/28/2024   Syncope 03/28/2024   Chronic kidney disease, stage 3 (HCC)    Anxiety 02/11/2021   Adjustment disorder with depressed mood 02/11/2021   Chronic kidney disease, stage 3 unspecified (HCC) 02/11/2021   Collagenous colitis 02/11/2021   Gastroesophageal reflux disease 02/11/2021   Migraine without aura, not refractory 02/11/2021   Mild depression 02/11/2021   Osteopenia 02/11/2021   Early satiety 01/12/2021   Bloating 01/12/2021   Abdominal pain 01/12/2021   Chondromalacia patellae 06/11/2012   Status post arthroscopy of shoulder 04/16/2012   Rotator cuff tear 12/28/2011   Rotator cuff tendinitis 12/28/2011    Disorder of bursae and tendons in shoulder region 09/19/2011   Golfer's elbow 09/19/2011    PCP: Mabel Pleas, PA   REFERRING PROVIDER: Mabel Pleas, PA  REFERRING DIAG: Gluteal pain; midback pain  Rationale for Evaluation and Treatment: Rehabilitation  THERAPY DIAG:  Acute left-sided low back pain without sciatica  Other symptoms and signs involving the musculoskeletal system  Abnormal posture  Muscle weakness (generalized)  ONSET DATE: 12/13/23  SUBJECTIVE:  SUBJECTIVE STATEMENT: Now having pain in the L leg that feesl like muscle soreness - down into calf. Back pain is in the L LB area.    Eval: Patient reports that she has had pain in the L LB area for the past 6 months which was treated with chiropractic care. Since May the chiropractic care has not relieved symptoms. She has no known injury. She started having pain in the L glut in the past 6 weeks. She is improving but continues to have some pain, tingling in the L ankle and tightness in the L leg   PERTINENT HISTORY:  Lt shoulder scope RC, Lt 05/25/17 DCR/SAD/frayed RC/torn labrum; Rt shoulder pain; cervical pain 2018; LBP 2020; Rt knee pain 2022; low blood pressure now controlled with meds   PAIN:  Are you having pain? Yes: NPRS scale: 4-5/10; best in past week 2/10; worst 5/10  Pain location: L low to mid back; L glut  Pain description: ache Aggravating factors: mowing; vacuuming Relieving factors: ice; heat; hot bath  PRECAUTIONS: None   WEIGHT BEARING RESTRICTIONS: No  FALLS:  Has patient fallen in last 6 months? No  LIVING ENVIRONMENT: Lives with: lives alone Lives in: house Stairs: Yes: External: 1 steps; none; Internal steps 14; rail on R going up   OCCUPATION: retired Engineer, civil (consulting); active with household chores; yard  work; yoga 2x/wk; lift exercises 2x/wk   PATIENT GOALS: get rid of the pain   NEXT MD VISIT: 8/25  OBJECTIVE:  Note: Objective measures were completed at Evaluation unless otherwise noted.  DIAGNOSTIC FINDINGS:  None available for back and hip   PATIENT SURVEYS:  PSFS: THE PATIENT SPECIFIC FUNCTIONAL SCALE  Place score of 0-10 (0 = unable to perform activity and 10 = able to perform activity at the same level as before injury or problem)  Activity Date: 06/19/24    Vacuum  5    2. Rake  0    3. Lifting > 2 pounds  0    4.      Total Score 5      Total Score = Sum of activity scores/number of activities 5/3 = 1.67   Minimally Detectable Change: 3 points (for single activity); 2 points (for average score)  [facility] (nd). The Patient Specific Functional Scale . Retrieved from [email or web address]    SENSATION: Tingling in L ankle on an interment basis   MUSCLE LENGTH: Hamstrings: Right 65 deg; Left 70 deg Thomas test: WFL's  POSTURE: rounded shoulders, forward head, and increased thoracic kyphosis  PALPATION: tight L lats mid to low back; L gluts/piriformis    LUMBAR ROM:   AROM eval  Flexion 70% tight   Extension   Right lateral flexion 70% tight   Left lateral flexion   Right rotation   Left rotation    (Blank rows = not tested)  LOWER EXTREMITY ROM:   WFL's bilat   Active  Right eval Left eval  Hip flexion    Hip extension    Hip abduction    Hip adduction    Hip internal rotation    Hip external rotation    Knee flexion    Knee extension    Ankle dorsiflexion    Ankle plantarflexion    Ankle inversion    Ankle eversion     (Blank rows = not tested)  LOWER EXTREMITY MMT:    MMT Right eval Left eval  Hip flexion 5 4  Hip extension 5 4+  Hip abduction  5 4  Hip adduction    Hip internal rotation    Hip external rotation    Knee flexion    Knee extension    Ankle  dorsiflexion    Ankle plantarflexion    Ankle inversion    Ankle eversion     (Blank rows = not tested)  LUMBAR SPECIAL TESTS:  Straight leg raise test: Negative and Slump test: Negative  FUNCTIONAL TESTS:   Single leg stance: R 10; L 2-3 sec   GAIT: Distance walked: 40 ft  Assistive device utilized: None Level of assistance: Complete Independence Comments: WFL's   [facility] Adult PT Treatment:                                                DATE: 07/04/24 Therapeutic Exercise: Supine  Piriformis stretch travell 30 sec x 3 Hamstring stretch with strap 30 sec x 3  Hip abductor stretch with strap hooklying 30 sec x 3  Hip adductor stretch with strap hooklying 30 sec x 3 Quadruped  Cat cow x 5   Child's pose 20 sec x 3  Sitting Standing  Manual Therapy:  STM and MFR L lumbar/thoracic musculature along lats to shoulder  Therapeutic Activity:  Sitting    Lateral trunk flexion over bolster and pillow 30 sec x 3 pushing up with R arm to return to sit  Supine   Happy baby 3 x 15 sec  Standing   Row blue TB 3 sec x 10 x 2    Lat pull blue TB sec x 10 x 2  Modalities:  Self Care: Avoid lifting heavy objects Continue with consistent exercises    [facility] Adult PT Treatment:                                                DATE: 06/27/24 Therapeutic Exercise: Supine  Piriformis stretch travell 30 sec x 3 Hamstring stretch with strap 30 sec x 3  Hip adductor stretch with strap hooklying 30 sec x 3 Quadruped  Cat cow x 2  Child's pose 20 sec x 3  Sitting Standing  Manual Therapy:  STM and MFR L lumbar/thoracic musculature along lats to shoulder  Therapeutic Activity:  Standing   Row blue TB 3 sec x 10 x 2    Lat pull blue TB sec x 10 x 2  Modalities:  Self Care: Avoid lifting heavy objects Continue with consistent exercises    TREATMENT DATE: 06/19/24 Evaluation; POC; HEP  PATIENT EDUCATION:  Education details: POC; HEP  Person educated: Patient Education method: Programmer, multimedia, Demonstration, Actor cues, Verbal cues, and Handouts Education comprehension: verbalized understanding, returned demonstration, verbal cues required, tactile cues required, and needs further education  HOME EXERCISE PROGRAM: Access Code: [email or web address] URL: [email or web address] Date: 07/04/2024 Prepared by: Sheelah Ritacco  Program Notes Lat stretch lying on back pull knees to chest hold with abdominal muscles or prop feet turn hands with palms facing your facepush hands over head keeping elbows toward each other relax arms overhead 30- 45 sec hold 3 reps   Exercises - Supine Piriformis Stretch with Leg Straight  - 2 x daily - 7 x weekly - 1 sets - 3 reps - 30 sec  hold - Supine Pelvic Floor Stretch  - 2 x daily - 7 x weekly - 1-2 sets - 3-5 reps - 15 sec hold - Hooklying Hamstring Stretch with Strap  - 2 x daily - 7 x weekly - 1 sets - 3 reps - 30 sec  hold - Supine Sciatic Nerve Glide  - 2 x daily - 7 x weekly - 1 sets - 8-10 reps - 1-2 sec  hold - Hip Adductors and Hamstring Stretch with Strap  - 2 x daily - 7 x weekly - 1 sets - 3 reps - 30 sec  hold - Cat Cow  - 2 x daily - 7 x weekly - 1 sets - 5-10 reps - 3-5 sec  hold - Cat Cow to Child's Pose  - 2 x daily - 7 x weekly - 1 sets - 3 reps - 30 sec  hold - Standing Bilateral Low Shoulder Row with Anchored Resistance  - 2 x daily - 7 x weekly - 1-3 sets - 10 reps - 2-3 sec  hold - Shoulder extension with resistance - Neutral  - 1 x daily - 7 x weekly - 1-2 sets - 10 reps - 3-5 sec  hold - Supine ITB Stretch with Strap  - 2 x daily - 7 x weekly - 1 sets - 3 reps - 30 sec  hold - Seated Sidebending  - 2 x daily - 7 x weekly - 1 sets - 3 reps - 30 sec  hold  ASSESSMENT:  CLINICAL IMPRESSION: Patient returns with report of increased pain in the back of the L leg which she states feels muscular in  nature. Patient does have muscular tightness in the L HS; ITB; piriformis. Reviewed and corrected exercises to target tightness in these motions.   Improvement in back and L LE pain. Reviewed and progressed exercises focus on stretching and core strengthening. Tolerated exercises well. Continued manual work including myofacial release through lats and posterior thoracolumbar musculature into posterior shoulder.    Eval: Patient is a 73 y.o. female who was seen today for physical therapy evaluation and treatment for L mid to low back and L posterior hip pain. Back pain has been present for ~ 6 months with increased pain in the L posterior hip/glut area in the past 2 months. Patient has pain with lifting, vacuuming and raking. She has tightness to palpation through the L latissimus dorsi; QL; gluts; piriformis. She has positive neural tension test L LE. Patient has limited trunk mobility and pain with functional activities as at times at rest. Patient responded well to stretch for lats and LE in treatment today. She will benefit from PT to address problems identified.    GOALS: Goals reviewed with patient? Yes  SHORT TERM GOALS: Target  date: 07/17/2024   Decrease pain in L mid to low back allowing patient to move through normal functional positional changes without limitations. Baseline: Goal status: INITIAL  2.  Increase trunk ROM/mobility to [facility] and pain free  Baseline:  Goal status: INITIAL  3.  Patient independent in initial HEP  Baseline:  Goal status: INITIAL   LONG TERM GOALS: Target date: 08/14/2024   Patient reports decreased back and hip pain by 75-100% allowing her to return to all normal activities  Baseline:  Goal status: INITIAL  2.  5/5 strength L LE  Baseline:  Goal status: INITIAL  3.  Patient reports vacuuming with minimal to no increase in L back or hip pain  Baseline:  Goal status: INITIAL  4.  Patient reports ability to rake with minimal to no increase in  back or hip pain > 1-2/10  Baseline:  Goal status: INITIAL  5.  Patient demonstrates and/or reports ability to lift 5 pounds with no increase in back or hip pain  Baseline:  Goal status: INITIAL  6.  PSFS: THE PATIENT SPECIFIC FUNCTIONAL SCALE  Place score of 0-10 (0 = unable to perform activity and 10 = able to perform activity at the same level as before injury or problem)  Activity Date: 06/19/24    Vacuum  5    2. Rake  0    3. Lifting > 2 pounds  0    4.      Total Score 5      Total Score = Sum of activity scores/number of activities 5/3 = 1.67  Baseline: 1.67 Goal status: INITIAL  7. Independent in advanced HEP   Baseline:   Coal status: INITIAL   PLAN:  PT FREQUENCY: 2x/week  PT DURATION: 8 weeks  PLANNED INTERVENTIONS: 97164- PT Re-evaluation, 97110-Therapeutic exercises, 97530- Therapeutic activity, [id number]- Neuromuscular re-education, 97535- Self Care, [id number]- Manual therapy, Patient/Family education, Balance training, Stair training, Taping, and Joint mobilization.  PLAN FOR NEXT SESSION: review and progress with exercise; continue with spine care education and ergonomic correction; manual work and modalities as indicated    Deashia Soule P Batina Dougan, PT 07/04/2024, 2:49 PM

## 2024-07-08 ENCOUNTER — Encounter: Payer: Self-pay | Admitting: Physical Therapy

## 2024-07-08 ENCOUNTER — Ambulatory Visit: Admitting: Physical Therapy

## 2024-07-08 DIAGNOSIS — M545 Low back pain, unspecified: Secondary | ICD-10-CM | POA: Diagnosis not present

## 2024-07-08 DIAGNOSIS — R293 Abnormal posture: Secondary | ICD-10-CM

## 2024-07-08 DIAGNOSIS — M6281 Muscle weakness (generalized): Secondary | ICD-10-CM

## 2024-07-08 DIAGNOSIS — R29898 Other symptoms and signs involving the musculoskeletal system: Secondary | ICD-10-CM | POA: Diagnosis not present

## 2024-07-08 NOTE — Therapy (Signed)
 OUTPATIENT PHYSICAL THERAPY THORACOLUMBAR TREATMENT   Patient Name: Mary Freeman MRN: [id number] DOB:1951-11-14, 73 y.o., female Today's Date: 07/08/2024  END OF SESSION:  PT End of Session - 07/08/24 1402     Visit Number 4    Number of Visits 16    Date for PT Re-Evaluation 08/14/24    Authorization Type [employer] medicare copay $10    Progress Note Due on Visit 10    PT Start Time 1402    PT Stop Time 1443    PT Time Calculation (min) 41 min           Past Medical History:  Diagnosis Date   Chronic kidney disease, stage 3 (HCC)    Depression    Headache    migraines   Hypotension    NSVT (nonsustained ventricular tachycardia) (HCC)    PONV (postoperative nausea and vomiting)    SVT (supraventricular tachycardia) (HCC)    Syncope and collapse    Past Surgical History:  Procedure Laterality Date   APPENDECTOMY     BREAST EXCISIONAL BIOPSY Right    benign   COLONOSCOPY     KNEE ARTHROSCOPY Right    x3   KNEE ARTHROSCOPY W/ MENISCAL REPAIR Left    LUMBAR LAMINECTOMY  1986   REDUCTION MAMMAPLASTY Bilateral    2006   ROTATOR CUFF REPAIR Right 2012   TONSILLECTOMY     TUBAL LIGATION     Patient Active Problem List   Diagnosis Date Noted   Palpitations 03/28/2024   Cardiomyopathy (HCC) 03/28/2024   Syncope 03/28/2024   Chronic kidney disease, stage 3 (HCC)    Anxiety 02/11/2021   Adjustment disorder with depressed mood 02/11/2021   Chronic kidney disease, stage 3 unspecified (HCC) 02/11/2021   Collagenous colitis 02/11/2021   Gastroesophageal reflux disease 02/11/2021   Migraine without aura, not refractory 02/11/2021   Mild depression 02/11/2021   Osteopenia 02/11/2021   Early satiety 01/12/2021   Bloating 01/12/2021   Abdominal pain 01/12/2021   Chondromalacia patellae 06/11/2012   Status post arthroscopy of shoulder 04/16/2012   Rotator cuff tear 12/28/2011   Rotator cuff tendinitis 12/28/2011   Disorder of bursae and tendons in shoulder region  09/19/2011   Golfer's elbow 09/19/2011    PCP: Mabel Pleas, PA   REFERRING PROVIDER: Mabel Pleas, PA  REFERRING DIAG: Gluteal pain; midback pain  Rationale for Evaluation and Treatment: Rehabilitation  THERAPY DIAG:  Acute left-sided low back pain without sciatica  Other symptoms and signs involving the musculoskeletal system  Abnormal posture  Muscle weakness (generalized)  ONSET DATE: 12/13/23  SUBJECTIVE:  SUBJECTIVE STATEMENT: 07/08/2024: feels like she has improved since starting PT - less leg pain, less constant pain. Still feels like she has to be careful with how she moves. Ice/exercises will take pain away.    Eval: Patient reports that she has had pain in the L LB area for the past 6 months which was treated with chiropractic care. Since May the chiropractic care has not relieved symptoms. She has no known injury. She started having pain in the L glut in the past 6 weeks. She is improving but continues to have some pain, tingling in the L ankle and tightness in the L leg   PERTINENT HISTORY:  Lt shoulder scope RC, Lt 05/25/17 DCR/SAD/frayed RC/torn labrum; Rt shoulder pain; cervical pain 2018; LBP 2020; Rt knee pain 2022; low blood pressure now controlled with meds   PAIN:  Are you having pain? Yes: NPRS scale: 5/10; best in past week 3/10; worst 8/10  Pain location: L low to mid back; L glut  Pain description: ache Aggravating factors: mowing; vacuuming Relieving factors: ice; heat; hot bath  PRECAUTIONS: None   WEIGHT BEARING RESTRICTIONS: No  FALLS:  Has patient fallen in last 6 months? No  LIVING ENVIRONMENT: Lives with: lives alone Lives in: house Stairs: Yes: External: 1 steps; none; Internal steps 14; rail on R going up   OCCUPATION: retired Engineer, civil (consulting); active with  household chores; yard work; yoga 2x/wk; lift exercises 2x/wk   PATIENT GOALS: get rid of the pain   NEXT MD VISIT: 8/25  OBJECTIVE:  Note: Objective measures were completed at Evaluation unless otherwise noted.  DIAGNOSTIC FINDINGS:  None available for back and hip   PATIENT SURVEYS:  PSFS: THE PATIENT SPECIFIC FUNCTIONAL SCALE  Place score of 0-10 (0 = unable to perform activity and 10 = able to perform activity at the same level as before injury or problem)  Activity Date: 06/19/24    Vacuum  5    2. Rake  0    3. Lifting > 2 pounds  0    4.      Total Score 5      Total Score = Sum of activity scores/number of activities 5/3 = 1.67   Minimally Detectable Change: 3 points (for single activity); 2 points (for average score)  [facility] (nd). The Patient Specific Functional Scale . Retrieved from [email or web address]    SENSATION: Tingling in L ankle on an interment basis   MUSCLE LENGTH: Hamstrings: Right 65 deg; Left 70 deg Thomas test: WFL's  POSTURE: rounded shoulders, forward head, and increased thoracic kyphosis  PALPATION: tight L lats mid to low back; L gluts/piriformis    LUMBAR ROM:   AROM eval  Flexion 70% tight   Extension   Right lateral flexion 70% tight   Left lateral flexion   Right rotation   Left rotation    (Blank rows = not tested)  LOWER EXTREMITY ROM:   WFL's bilat   Active  Right eval Left eval  Hip flexion    Hip extension    Hip abduction    Hip adduction    Hip internal rotation    Hip external rotation    Knee flexion    Knee extension    Ankle dorsiflexion    Ankle plantarflexion    Ankle inversion    Ankle eversion     (Blank rows = not tested)  LOWER EXTREMITY MMT:    MMT Right eval Left eval  Hip flexion 5  4  Hip extension 5 4+  Hip abduction 5 4  Hip adduction    Hip internal rotation    Hip external rotation    Knee flexion    Knee  extension    Ankle dorsiflexion    Ankle plantarflexion    Ankle inversion    Ankle eversion     (Blank rows = not tested)  LUMBAR SPECIAL TESTS:  Straight leg raise test: Negative and Slump test: Negative  FUNCTIONAL TESTS:   Single leg stance: R 10; L 2-3 sec   GAIT: Distance walked: 40 ft  Assistive device utilized: None Level of assistance: Complete Independence Comments: WFL's    [facility] Adult PT Treatment:                                                DATE: 07/08/24 Therapeutic Exercise: Bridge 2x8 w/ 2 sec hold cues for form and comfortable ROM Alternating; HS stretch w/ strap 2x30sec, adductor stretch w/ strap 2x30sec HEP discussion/education  Neuromuscular re-ed: Green band row x12; blue band 2x10 cues for posture Green band shoulder ext x3, discontinued d/t pain Red band short lever high>low row x3, discontinued d/t pain Seated lat activation w/ scapular depression, 2x15  tactile cues to ensure lat activation Inc time throughout for education/discussion re: relevant anatomy/physiology and rationale for interventions, symptom behavior with postural work   [facility] Adult PT Treatment:                                                DATE: 07/04/24 Therapeutic Exercise: Supine  Piriformis stretch travell 30 sec x 3 Hamstring stretch with strap 30 sec x 3  Hip abductor stretch with strap hooklying 30 sec x 3  Hip adductor stretch with strap hooklying 30 sec x 3 Quadruped  Cat cow x 5   Child's pose 20 sec x 3  Sitting Standing  Manual Therapy:  STM and MFR L lumbar/thoracic musculature along lats to shoulder  Therapeutic Activity:  Sitting    Lateral trunk flexion over bolster and pillow 30 sec x 3 pushing up with R arm to return to sit  Supine   Happy baby 3 x 15 sec  Standing   Row blue TB 3 sec x 10 x 2    Lat pull blue TB sec x 10 x 2  Modalities:  Self Care: Avoid lifting heavy objects Continue with consistent exercises    [facility] Adult PT Treatment:                                                 DATE: 06/27/24 Therapeutic Exercise: Supine  Piriformis stretch travell 30 sec x 3 Hamstring stretch with strap 30 sec x 3  Hip adductor stretch with strap hooklying 30 sec x 3 Quadruped  Cat cow x 2  Child's pose 20 sec x 3  Sitting Standing  Manual Therapy:  STM and MFR L lumbar/thoracic musculature along lats to shoulder  Therapeutic Activity:  Standing   Row blue TB 3 sec x 10 x 2    Lat pull blue TB  sec x 10 x 2  Modalities:  Self Care: Avoid lifting heavy objects Continue with consistent exercises    TREATMENT DATE: 06/19/24 Evaluation; POC; HEP                                                                                                                                  PATIENT EDUCATION:  Education details: POC; HEP  Person educated: Patient Education method: Explanation, Demonstration, Tactile cues, Verbal cues, and Handouts Education comprehension: verbalized understanding, returned demonstration, verbal cues required, tactile cues required, and needs further education  HOME EXERCISE PROGRAM: Access Code: [email or web address] URL: [email or web address] Date: 07/04/2024 Prepared by: Celyn Holt  Program Notes Lat stretch lying on back pull knees to chest hold with abdominal muscles or prop feet turn hands with palms facing your facepush hands over head keeping elbows toward each other relax arms overhead 30- 45 sec hold 3 reps   Exercises - Supine Piriformis Stretch with Leg Straight  - 2 x daily - 7 x weekly - 1 sets - 3 reps - 30 sec  hold - Supine Pelvic Floor Stretch  - 2 x daily - 7 x weekly - 1-2 sets - 3-5 reps - 15 sec hold - Hooklying Hamstring Stretch with Strap  - 2 x daily - 7 x weekly - 1 sets - 3 reps - 30 sec  hold - Supine Sciatic Nerve Glide  - 2 x daily - 7 x weekly - 1 sets - 8-10 reps - 1-2 sec  hold - Hip Adductors and Hamstring Stretch with Strap  - 2 x daily - 7 x weekly - 1 sets - 3 reps - 30  sec  hold - Cat Cow  - 2 x daily - 7 x weekly - 1 sets - 5-10 reps - 3-5 sec  hold - Cat Cow to Child's Pose  - 2 x daily - 7 x weekly - 1 sets - 3 reps - 30 sec  hold - Standing Bilateral Low Shoulder Row with Anchored Resistance  - 2 x daily - 7 x weekly - 1-3 sets - 10 reps - 2-3 sec  hold - Shoulder extension with resistance - Neutral  - 1 x daily - 7 x weekly - 1-2 sets - 10 reps - 3-5 sec  hold - Supine ITB Stretch with Strap  - 2 x daily - 7 x weekly - 1 sets - 3 reps - 30 sec  hold - Seated Sidebending  - 2 x daily - 7 x weekly - 1 sets - 3 reps - 30 sec  hold  ASSESSMENT:  CLINICAL IMPRESSION: 07/08/2024: Pt arrives w/ report of continued difficulty with heavier household tasks, although symptom irritability is reportedly improving. Today we work on postural activation, does well initially but with shoulder ext has significant increase in pain with cues for lat contraction - this continues even with modification for reduced resistance and shorter lever. Given this, we  modify to unresisted lat activation + scap depression which she does well with. Remainder of session tolerated well and pt reports baseline symptoms on departure. Cues as above, no adverse events. Recommend continuing along current POC in order to address relevant deficits and improve functional tolerance. Pt departs today's session in no acute distress, all voiced questions/concerns addressed appropriately from PT perspective.    Eval: Patient is a 73 y.o. female who was seen today for physical therapy evaluation and treatment for L mid to low back and L posterior hip pain. Back pain has been present for ~ 6 months with increased pain in the L posterior hip/glut area in the past 2 months. Patient has pain with lifting, vacuuming and raking. She has tightness to palpation through the L latissimus dorsi; QL; gluts; piriformis. She has positive neural tension test L LE. Patient has limited trunk mobility and pain with functional  activities as at times at rest. Patient responded well to stretch for lats and LE in treatment today. She will benefit from PT to address problems identified.    GOALS: Goals reviewed with patient? Yes  SHORT TERM GOALS: Target date: 07/17/2024   Decrease pain in L mid to low back allowing patient to move through normal functional positional changes without limitations. Baseline: Goal status: INITIAL  2.  Increase trunk ROM/mobility to [facility] and pain free  Baseline:  Goal status: INITIAL  3.  Patient independent in initial HEP  Baseline:  Goal status: INITIAL   LONG TERM GOALS: Target date: 08/14/2024   Patient reports decreased back and hip pain by 75-100% allowing her to return to all normal activities  Baseline:  Goal status: INITIAL  2.  5/5 strength L LE  Baseline:  Goal status: INITIAL  3.  Patient reports vacuuming with minimal to no increase in L back or hip pain  Baseline:  Goal status: INITIAL  4.  Patient reports ability to rake with minimal to no increase in back or hip pain > 1-2/10  Baseline:  Goal status: INITIAL  5.  Patient demonstrates and/or reports ability to lift 5 pounds with no increase in back or hip pain  Baseline:  Goal status: INITIAL  6.  PSFS: THE PATIENT SPECIFIC FUNCTIONAL SCALE  Place score of 0-10 (0 = unable to perform activity and 10 = able to perform activity at the same level as before injury or problem)  Activity Date: 06/19/24    Vacuum  5    2. Rake  0    3. Lifting > 2 pounds  0    4.      Total Score 5      Total Score = Sum of activity scores/number of activities 5/3 = 1.67  Baseline: 1.67 Goal status: INITIAL  7. Independent in advanced HEP   Baseline:   Coal status: INITIAL   PLAN:  PT FREQUENCY: 2x/week  PT DURATION: 8 weeks  PLANNED INTERVENTIONS: 97164- PT Re-evaluation, 97110-Therapeutic exercises, 97530- Therapeutic activity, [id number]- Neuromuscular re-education, 97535- Self Care, [id number]- Manual therapy,  Patient/Family education, Balance training, Stair training, Taping, and Joint mobilization.  PLAN FOR NEXT SESSION: review and progress with exercise; continue with spine care education and ergonomic correction; manual work and modalities as indicated    Alm DELENA Jenny PT, DPT 07/08/2024 4:34 PM

## 2024-07-10 ENCOUNTER — Ambulatory Visit: Admitting: Physical Therapy

## 2024-07-15 ENCOUNTER — Other Ambulatory Visit: Payer: Self-pay

## 2024-07-15 ENCOUNTER — Ambulatory Visit: Admitting: Rehabilitative and Restorative Service Providers"

## 2024-07-15 DIAGNOSIS — I959 Hypotension, unspecified: Secondary | ICD-10-CM | POA: Insufficient documentation

## 2024-07-15 DIAGNOSIS — I471 Supraventricular tachycardia, unspecified: Secondary | ICD-10-CM | POA: Insufficient documentation

## 2024-07-15 DIAGNOSIS — I4729 Other ventricular tachycardia: Secondary | ICD-10-CM | POA: Insufficient documentation

## 2024-07-15 DIAGNOSIS — R55 Syncope and collapse: Secondary | ICD-10-CM | POA: Insufficient documentation

## 2024-07-15 DIAGNOSIS — R519 Headache, unspecified: Secondary | ICD-10-CM | POA: Insufficient documentation

## 2024-07-15 DIAGNOSIS — Z9889 Other specified postprocedural states: Secondary | ICD-10-CM | POA: Insufficient documentation

## 2024-07-15 DIAGNOSIS — F32A Depression, unspecified: Secondary | ICD-10-CM | POA: Insufficient documentation

## 2024-07-16 ENCOUNTER — Ambulatory Visit

## 2024-07-16 VITALS — BP 84/46 | HR 62 | Ht 67.0 in | Wt 120.0 lb

## 2024-07-16 DIAGNOSIS — I4729 Other ventricular tachycardia: Secondary | ICD-10-CM | POA: Diagnosis not present

## 2024-07-16 DIAGNOSIS — I429 Cardiomyopathy, unspecified: Secondary | ICD-10-CM | POA: Diagnosis not present

## 2024-07-16 DIAGNOSIS — I471 Supraventricular tachycardia, unspecified: Secondary | ICD-10-CM

## 2024-07-16 NOTE — Assessment & Plan Note (Signed)
 Normalized LVEF. Mildly reduced LVEF diffuse hypokinesis on echocardiogram April 2025. Cardiac MRI 04/26/2024 with normalized biventricular function and no evidence of LGE.  Euvolemic and compensated. NYHA class I. Attending yoga classes until recently.  Continue with low-salt diet. Continue with low-dose metoprolol  succinate 12.5 mg once daily in the setting of history of cardiomyopathy and occasional supraventricular ectopy burden and runs of SVT on Zio patch. Blood pressure soft but well-controlled. Manually checked 104 over 52 mmHg today.

## 2024-07-16 NOTE — Progress Notes (Signed)
 Cardiology Consultation:    Date:  07/16/2024   ID:  Mary Freeman, DOB 07/23/1951, MRN [id number]  PCP:  Sheldon Netter, PA  Cardiologist:  Alean SAUNDERS Jandy Brackens, MD   Referring MD: Sheldon Netter, PA   No chief complaint on file.    ASSESSMENT AND PLAN:   Mary Freeman 73 year old woman history of mild cardiomyopathy on echocardiogram April 2025 with LVEF 40 to 45% diffuse hypokinesis that subsequently normalized on cardiac MRI imaging study from 04/26/2024, normal coronary angiogram 04/23/2024, CKD stage III, recurrent syncopal episodes over the past 9 months, abnormal Zio patch results 14-day study 11/17/2023 with supraventricular ectopy burden 3.1% and ventricular ectopy burden less than 1%, 4 short runs of NSVT with the longest episode lasting 10 beats and 88 runs of SVT with the longest episode lasting 18 seconds consistent with ectopic atrial tachycardia and patient triggered events totaled 26 mostly correlated with sinus rhythm and sinus tachycardia and at times with isolated ectopic beats.    Problem List Items Addressed This Visit     Cardiomyopathy (HCC) - Primary   Normalized LVEF. Mildly reduced LVEF diffuse hypokinesis on echocardiogram April 2025. Cardiac MRI 04/26/2024 with normalized biventricular function and no evidence of LGE.  Euvolemic and compensated. NYHA class I. Attending yoga classes until recently.  Continue with low-salt diet. Continue with low-dose metoprolol  succinate 12.5 mg once daily in the setting of history of cardiomyopathy and occasional supraventricular ectopy burden and runs of SVT on Zio patch. Blood pressure soft but well-controlled. Manually checked 104 over 52 mmHg today.      NSVT (nonsustained ventricular tachycardia) (HCC)   Short nonsustained run of NSVT on Zio patch December 2024. Continue low-dose metoprolol  succinate 12.5 mg once daily.      SVT (supraventricular tachycardia) (HCC)   Short runs of SVT, longest episode lasting  10 beats on Zio patch December 2024. Continue with low-dose metoprolol  succinate 12.5 mg once daily.       No obstructive coronary artery disease on cardiac CT. Okay to discontinue aspirin  from cardiac standpoint.  Follow-up in the office tentatively in 6 months.   History of Present Illness:    Mary Freeman is a 73 y.o. female who is being seen today for follow-up visit. PCP is Sheldon Netter, [place]. Last visit with me in the office was 03/28/2024.  Lives by herself.  Here for the visit today by herself.  Unfortunately she recently had her second daughter passed away suddenly from unknown causes at home.  Has history of mild cardiomyopathy on echocardiogram April 2025 with LVEF 40 to 45% diffuse hypokinesis that subsequently normalized on cardiac MRI imaging study from 04/26/2024, normal coronary angiogram 04/23/2024, CKD stage III, recurrent syncopal episodes over the past 9 months, abnormal Zio patch results 14-day study 11/17/2023 with supraventricular ectopy burden 3.1% and ventricular ectopy burden less than 1%, 4 short runs of NSVT with the longest episode lasting 10 beats and 88 runs of SVT with the longest episode lasting 18 seconds consistent with ectopic atrial tachycardia and patient triggered events totaled 26 mostly correlated with sinus rhythm and sinus tachycardia and at times with isolated ectopic beats.  She also had evaluation with MRI of the brain 04/20/2024 that showed mild to moderate chronic microvascular ischemic changes.  She has been dealing with acute grief with loss of her second daughter recently from unknown causes was found unresponsive and dead in her kitchen.  She had history of alcohol abuse but no obvious cause evident. She is  currently dealing with her loss and helping clean out her daughter's apartment.  From cardiac standpoint she does not have any active ongoing symptoms.  Denies any lightheadedness or syncopal episodes. She pacers her activities and  recently while mowing her yard out in hot weather she had heat exhaustion.  Since then she has been taking breaks frequently.  Checks her blood pressures at home and typically systolic in 100s. Today at initial office visit systolic were noted to be low but rechecked manually myself manually blood pressure was 104 over 52 mmHg.  She currently denies any ongoing symptoms of lightheadedness dizziness or chest pain. She has been extremely tearful throughout the visit discussing her daughter's death.  Compliant with her medications.  Past Medical History:  Diagnosis Date   Abdominal pain 01/12/2021   Adjustment disorder with depressed mood 02/11/2021   Anxiety 02/11/2021   Bloating 01/12/2021   Cardiomyopathy (HCC) 03/28/2024   Chondromalacia patellae 06/11/2012   Chronic kidney disease, stage 3 (HCC)    Chronic kidney disease, stage 3 unspecified (HCC) 02/11/2021   Collagenous colitis 02/11/2021   Depression    Disorder of bursae and tendons in shoulder region 09/19/2011   Early satiety 01/12/2021   Gastroesophageal reflux disease 02/11/2021   Golfer's elbow 09/19/2011   Headache    migraines   Hypotension    Migraine without aura, not refractory 02/11/2021   Mild depression 02/11/2021   NSVT (nonsustained ventricular tachycardia) (HCC)    Osteopenia 02/11/2021   Palpitations 03/28/2024   PONV (postoperative nausea and vomiting)    Rotator cuff tear 12/28/2011   Rotator cuff tendinitis 12/28/2011   Status post arthroscopy of shoulder 04/16/2012   SVT (supraventricular tachycardia) (HCC)    Syncope 03/28/2024   Syncope and collapse     Past Surgical History:  Procedure Laterality Date   APPENDECTOMY     BREAST EXCISIONAL BIOPSY Right    benign   COLONOSCOPY     KNEE ARTHROSCOPY Right    x3   KNEE ARTHROSCOPY W/ MENISCAL REPAIR Left    LUMBAR LAMINECTOMY  1986   REDUCTION MAMMAPLASTY Bilateral    2006   ROTATOR CUFF REPAIR Right 2012   TONSILLECTOMY     TUBAL  LIGATION      Current Medications: No outpatient medications have been marked as taking for the 07/16/24 encounter (Office Visit) with Cort Dragoo, Alean SAUNDERS, MD.     Allergies:   Morphine and codeine , Dilaudid [hydromorphone], Diphenhydramine, Prednisone, Stadol [butorphanol], Sulfa antibiotics, Talwin [pentazocine], Benadryl [diphenhydramine hcl], Flagyl  [metronidazole ], Melatonin, Solu-medrol  [methylprednisolone ], Tyloxapol, Amitriptyline, Butorphanol tartrate, Oxycodone-acetaminophen , and Promethazine    Social History   Socioeconomic History   Marital status: Divorced    Spouse name: Not on file   Number of children: Not on file   Years of education: Not on file   Highest education level: Not on file  Occupational History   Not on file  Tobacco Use   Smoking status: Never   Smokeless tobacco: Never  Vaping Use   Vaping status: Never Used  Substance and Sexual Activity   Alcohol use: Yes    Comment: very rare    Drug use: No   Sexual activity: Not Currently    Birth control/protection: None  Other Topics Concern   Not on file  Social History Narrative   Not on file   Social Drivers of Health   Financial Resource Strain: Not on file  Food Insecurity: Not on file  Transportation Needs: Not on file  Physical Activity: Not  on file  Stress: Not on file  Social Connections: Unknown (04/08/2022)   [facility]   Social Network    Social Network: Not on file     Family History: The patient's family history includes Alcoholism in her brother, daughter, daughter, mother, and sister; Breast cancer (age of onset: 40) in her sister; Breast cancer (age of onset: 37) in her sister; Cancer in her father, sister, and sister. ROS:   Please see the history of present illness.    All 14 point review of systems negative except as described per history of present illness.  EKGs/Labs/Other Studies Reviewed:    The following studies were reviewed today: Cardiac Studies  & Procedures   ______________________________________________________________________________________________     ECHOCARDIOGRAM  ECHOCARDIOGRAM COMPLETE 03/01/2024  Narrative ECHOCARDIOGRAM REPORT    Patient Name:   Mary Freeman Date of Exam: 03/01/2024 Medical Rec #:  [id number]      Height:       67.5 in Accession #:    [id number]     Weight:       120.0 lb Date of Birth:  1951/06/14       BSA:          1.637 m Patient Age:    73 years       BP:           133/81 mmHg Patient Gender: F              HR:           76 bpm. Exam Location:  Outpatient  Procedure: 2D Echo, Color Doppler and Cardiac Doppler (Both Spectral and Color Flow Doppler were utilized during procedure).  Indications:    Syncope  History:        Patient has no prior history of Echocardiogram examinations.  Sonographer:    Garnette Moats Referring Phys: [id number] LISA MILLER  IMPRESSIONS   1. Left ventricular ejection fraction, by estimation, is 40 to 45%. The left ventricle has mildly decreased function. The left ventricle demonstrates global hypokinesis. Left ventricular diastolic parameters are consistent with Grade I diastolic dysfunction (impaired relaxation). 2. Right ventricular systolic function is normal. The right ventricular size is normal. There is normal pulmonary artery systolic pressure. The estimated right ventricular systolic pressure is 21.5 mmHg. 3. A small pericardial effusion is present. The pericardial effusion is anterior to the right ventricle. 4. The mitral valve is normal in structure. No evidence of mitral valve regurgitation. No evidence of mitral stenosis. 5. Tricuspid valve regurgitation is mild to moderate. 6. The aortic valve is tricuspid. Aortic valve regurgitation is not visualized. No aortic stenosis is present. 7. The inferior vena cava is normal in size with greater than 50% respiratory variability, suggesting right atrial pressure of 3 mmHg.  FINDINGS Left Ventricle: Left  ventricular ejection fraction, by estimation, is 40 to 45%. The left ventricle has mildly decreased function. The left ventricle demonstrates global hypokinesis. The left ventricular internal cavity size was normal in size. There is no left ventricular hypertrophy. Left ventricular diastolic parameters are consistent with Grade I diastolic dysfunction (impaired relaxation). Normal left ventricular filling pressure.  Right Ventricle: The right ventricular size is normal. No increase in right ventricular wall thickness. Right ventricular systolic function is normal. There is normal pulmonary artery systolic pressure. The tricuspid regurgitant velocity is 2.15 m/s, and with an assumed right atrial pressure of 3 mmHg, the estimated right ventricular systolic pressure is 21.5 mmHg.  Left Atrium: Left atrial size  was normal in size.  Right Atrium: Right atrial size was normal in size.  Pericardium: A small pericardial effusion is present. The pericardial effusion is anterior to the right ventricle.  Mitral Valve: The mitral valve is normal in structure. No evidence of mitral valve regurgitation. No evidence of mitral valve stenosis. MV peak gradient, 2.3 mmHg. The mean mitral valve gradient is 1.0 mmHg.  Tricuspid Valve: The tricuspid valve is normal in structure. Tricuspid valve regurgitation is mild to moderate. No evidence of tricuspid stenosis.  Aortic Valve: The aortic valve is tricuspid. Aortic valve regurgitation is not visualized. No aortic stenosis is present. Aortic valve mean gradient measures 2.0 mmHg. Aortic valve peak gradient measures 3.5 mmHg. Aortic valve area, by VTI measures 2.94 cm.  Pulmonic Valve: The pulmonic valve was normal in structure. Pulmonic valve regurgitation is not visualized. No evidence of pulmonic stenosis.  Aorta: The aortic root is normal in size and structure.  Venous: The inferior vena cava is normal in size with greater than 50% respiratory variability,  suggesting right atrial pressure of 3 mmHg.  IAS/Shunts: The interatrial septum is aneurysmal. No atrial level shunt detected by color flow Doppler.   LEFT VENTRICLE PLAX 2D LVIDd:         4.00 cm     Diastology LVIDs:         3.00 cm     LV e' medial:    5.44 cm/s LV PW:         0.80 cm     LV E/e' medial:  9.0 LV IVS:        0.70 cm     LV e' lateral:   7.40 cm/s LVOT diam:     2.00 cm     LV E/e' lateral: 6.6 LV SV:         56 LV SV Index:   34 LVOT Area:     3.14 cm  LV Volumes (MOD) LV vol d, MOD A2C: 69.0 ml LV vol d, MOD A4C: 77.8 ml LV vol s, MOD A2C: 38.6 ml LV vol s, MOD A4C: 48.3 ml LV SV MOD A2C:     30.4 ml LV SV MOD A4C:     77.8 ml LV SV MOD BP:      29.6 ml  RIGHT VENTRICLE             IVC RV Basal diam:  3.50 cm     IVC diam: 1.30 cm RV S prime:     10.20 cm/s TAPSE (M-mode): 1.9 cm  LEFT ATRIUM             Index        RIGHT ATRIUM           Index LA Vol (A2C):   30.4 ml 18.57 ml/m  RA Area:     12.40 cm LA Vol (A4C):   32.2 ml 19.67 ml/m  RA Volume:   27.80 ml  16.99 ml/m LA Biplane Vol: 32.2 ml 19.67 ml/m AORTIC VALVE AV Area (Vmax):    2.87 cm AV Area (Vmean):   3.02 cm AV Area (VTI):     2.94 cm AV Vmax:           93.00 cm/s AV Vmean:          66.900 cm/s AV VTI:            0.190 m AV Peak Grad:      3.5 mmHg AV Mean Grad:  2.0 mmHg LVOT Vmax:         85.10 cm/s LVOT Vmean:        64.300 cm/s LVOT VTI:          0.178 m LVOT/AV VTI ratio: 0.94  AORTA Ao Root diam: 3.40 cm Ao Asc diam:  3.60 cm  MITRAL VALVE               TRICUSPID VALVE MV Area (PHT): 2.17 cm    TR Peak grad:   18.5 mmHg MV Area VTI:   3.21 cm    TR Vmax:        215.00 cm/s MV Peak grad:  2.3 mmHg MV Mean grad:  1.0 mmHg    SHUNTS MV Vmax:       0.76 m/s    Systemic VTI:  0.18 m MV Vmean:      40.9 cm/s   Systemic Diam: 2.00 cm MV Decel Time: 349 msec MV E velocity: 48.70 cm/s MV A velocity: 65.50 cm/s MV E/A ratio:  0.74  Mihai Croitoru  MD Electronically signed by Jerel Balding MD Signature Date/Time: 03/01/2024/4:37:30 PM    Final      CT SCANS  CT CORONARY MORPH W/CTA COR W/SCORE 04/23/2024  Addendum 05/03/2024 11:38 PM ADDENDUM REPORT: 05/03/2024 23:36  EXAM: OVER-READ INTERPRETATION  CT CHEST  The following report is an over-read performed by radiologist Dr. Oneil Devonshire of [facility] on 05/03/2024. This over-read does not include interpretation of cardiac or coronary anatomy or pathology. The coronary calcium score/coronary CTA interpretation by the cardiologist is attached.  COMPARISON:  None.  FINDINGS: Cardiovascular: There are no significant extracardiac vascular findings.  Mediastinum/Nodes: There are no enlarged lymph nodes within the visualized mediastinum.  Lungs/Pleura: There is no pleural effusion. The visualized lungs appear clear.  Upper abdomen: No significant findings in the visualized upper abdomen.  Musculoskeletal/Chest wall: No chest wall mass or suspicious osseous findings within the visualized chest.  IMPRESSION: No significant extracardiac findings within the visualized chest.   Electronically Signed By: Oneil Devonshire M.D. On: 05/03/2024 23:36  Narrative CLINICAL DATA:  CP  EXAM: Cardiac/Coronary  CTA  TECHNIQUE: The patient was scanned on a GE Apex scanner.  FINDINGS: A 120 kV prospective scan was triggered in the descending thoracic aorta at 111 HU's. Axial non-contrast 3 mm slices were carried out through the heart. The data set was analyzed on a dedicated work station and scored using the Agatson method. Gantry rotation speed was 250 msecs and collimation was .6 mm. No beta blockade and 0.8 mg of sl NTG was given. The 3D data set was reconstructed at 33% of the R-R cycle. Diastolic phases were analyzed on a dedicated work station using MPR, MIP and VRT modes. The patient received 80 cc of contrast.  Aorta:  Normal size.  No calcifications.   No dissection.  Aortic Valve:  Trileaflet.  No calcifications.  Coronary Arteries:  Normal coronary origin.  Right dominance.  RCA is a large dominant artery that gives rise to PDA and PLA. There is no plaque.  Left main is a large artery that gives rise to LAD and LCX arteries.  LAD is a large vessel that has no plaque. This artery gives rise to moderate size D1 and D2, free of disease.  LCX is a non-dominant artery that gives rise to one large OM1 branch. There is no plaque.  Other findings:  Normal pulmonary vein drainage into the left atrium. Rt middle pulmonary vein  noted - normal variant.  Normal left atrial appendage without a thrombus.  Normal size of the pulmonary artery.  IMPRESSION: 1. Coronary calcium score of 0. This was 0 percentile for age and sex matched control.  2. Normal coronary origin with right dominance.  3. CAD-RADS 0. No evidence of CAD (0%). Consider non-atherosclerotic causes of chest pain.  Electronically Signed: By: Lamar Fitch M.D. On: 04/23/2024 16:44   CARDIAC MRI  MR CARDIAC MORPHOLOGY W WO CONTRAST 04/26/2024  Narrative CLINICAL DATA:  Cardiomyopathy suspected  EXAM: MR CARDIA MORPHOLOGY WITHOUT AND WITH CONTRAST; MR CARDIAC VELOCITY FLOW MAPPING  TECHNIQUE: The patient was scanned on a 1.5 Tesla Siemens magnet. A dedicated cardiac coil was used. Functional imaging was done using TrueFisp sequences. 2,3, and 4 chamber views were done to assess for RWMA's. Modified Simpson's rule using a short axis stack was used to calculate an ejection fraction on a dedicated work [employer]. The patient received 10mL GADAVIST  GADOBUTROL  1 MMOL/ML IV SOLN. After 10 minutes inversion recovery sequences were used to assess for infiltration and scar tissue. Phase contrast velocity encoded images obtained x 2.  This examination is tailored for evaluation cardiac anatomy and function and provides very limited  assessment of noncardiac structures, which are accordingly not evaluated during interpretation. If there is clinical concern for extracardiac pathology, further evaluation with CT imaging should be considered.  FINDINGS: LEFT VENTRICLE:  Left ventricular chamber size: Normal.  Left ventricular wall thickness: normal.  Left ventricular systolic function: Normal  LVEF = 55%  There are no regional wall motion abnormalities.  T2 not reported due to motion artifact.  Normal first pass perfusion.  There is no post contrast delayed myocardial enhancement.  Normal T1 myocardial nulling kinetics suggest against a diagnosis of cardiac amyloidosis.  ECV = 32%, nonspecific elevation  RIGHT VENTRICLE:  Normal right ventricular chamber size.  Normal right ventricular wall thickness.  Borderline normal right ventricular systolic function.  RVEF = 52%  There are no regional wall motion abnormalities.  No post contrast delayed myocardial enhancement.  ATRIA:  Left atrium: normal  Right atrium: dilated  PERICARDIUM:  Normal pericardium.  Small pericardial effusion.  OTHER: No significant extracardiac findings.  MEASUREMENTS: Qp/Qs: Not reported due to artifact from motion  VALVES:  Tricuspid aortic valve.  Aortic valve regurgitation: Trace, regurgitant fraction <1%  Pulmonary valve regurgitation: trivia, regurgitant fraction 3%  Mitral valve regurgitation: Could not be accurately calcualted  Tricuspid valve regurgitation: Could not be accurately calculated  Left ventricle:  LV female  LV EF: 55 % (Normal 52-79%)  Absolute volumes:  LV EDV: (Normal 78-167 mL)  LV ESV: 47mL (Normal 21-64 mL)  LV SV: 59mL (Normal 52-114 mL)  CO: 3.1L/min (Normal 2.7-6.3 L/min)  Indexed volumes:  CI: 1.89L/min/sq-m (Normal 1.9-3.9 L/min/sq-m)  LV EDV: 41mL/sq-m (Normal 50-96 mL/sq-m)  LV ESV: 75mL/sq-m (Normal 10-40 mL/sq-m)  LV SV: 63mL/sq-m (Normal 33-64  mL/sq-m)  Right ventricle:  RV female  RV EF: 52% (Normal 52-80%)  Absolute volumes:  RV EDV: (Normal 79-175 mL)  RV ESV: 65mL (Normal 13-75 mL)  RV SV: 71mL (Normal 56-110 mL)  CO: 3.7L/min (Normal 2.7-6 L/min)  Indexed volumes:  CI: 2.28L/min/sq-m (Normal 1.8-3.8 L/min/sq-m)  RV EDV: 30mL/sq-m (Normal 51-97 mL/sq-m)  RV ESV: 58mL/sq-m (Normal 9-42 mL/sq-m)  RV SV: 24mL/sq-m (Normal 35-61 mL/sq-m)  IMPRESSION: Cardic motion artifact likely from arrhythmia degrades diagnostic quality of exam. Measurements are approximate values.  1. Normal left ventricular chamber size  and systolic function, LVEF 55%. No delayed myocardial enhancement. No evidence of scar, fibrosis, inflammation, or infiltrative process.  2. Normal right ventricular chamber size with low normal RV systolic function, RVEF 52%.  3.  Small pericardial effusion.   Electronically Signed By: Soyla Merck M.D. On: 04/29/2024 09:13   ______________________________________________________________________________________________      EKG:       Recent Labs: 03/29/2024: ALT 23; BUN 14; Creatinine, Ser 1.13; Magnesium 2.3; NT-Pro BNP 272; Potassium 5.0; Sodium 144 04/25/2024: Hemoglobin 15.0  Recent Lipid Panel    Component Value Date/Time   CHOL 211 (H) 03/29/2024 1048   TRIG 73 03/29/2024 1048   HDL 91 03/29/2024 1048   CHOLHDL 2.3 03/29/2024 1048   LDLCALC 107 (H) 03/29/2024 1048    Physical Exam:    VS:  BP (!) 84/46   Pulse 62   Ht 5' 7 (1.702 m)   Wt 120 lb (54.4 kg)   SpO2 96%   BMI 18.79 kg/m     Wt Readings from Last 3 Encounters:  07/16/24 120 lb (54.4 kg)  03/28/24 122 lb (55.3 kg)  11/08/23 120 lb (54.4 kg)     GENERAL:  Well nourished, well developed in no acute distress CARDIAC: RRR, S1 and S2 present, no murmurs, no rubs, no gallops CHEST:  Clear to auscultation without rales, wheezing or rhonchi  Extremities: No pitting pedal edema. Pulses bilaterally  symmetric with radial 2+ and dorsalis pedis 2+ NEUROLOGIC:  Alert and oriented x 3  Medication Adjustments/Labs and Tests Ordered: Current medicines are reviewed at length with the patient today.  Concerns regarding medicines are outlined above.  No orders of the defined types were placed in this encounter.  No orders of the defined types were placed in this encounter.   Signed, Alean jess Kobus, MD, MPH, [facility]. 07/16/2024 2:13 PM    [facility]

## 2024-07-16 NOTE — Assessment & Plan Note (Signed)
 Short nonsustained run of NSVT on Zio patch December 2024. Continue low-dose metoprolol  succinate 12.5 mg once daily.

## 2024-07-16 NOTE — Assessment & Plan Note (Signed)
 Short runs of SVT, longest episode lasting 10 beats on Zio patch December 2024. Continue with low-dose metoprolol  succinate 12.5 mg once daily.

## 2024-07-16 NOTE — Patient Instructions (Addendum)
 Medication Instructions:  YOU MAY STOP TAKING YOUR ASPIRIN . CONTINUE ALL OTHER CURRENT MEDICATION THERAPY.  Lab Work: NONE TO BE DONE TODAY.   Testing/Procedures: NONE  Follow-Up: At [facility], you and your health needs are our priority.  As part of our continuing mission to provide you with exceptional heart care, our providers are all part of one team.  This team includes your primary Cardiologist (physician) and Advanced Practice Providers or APPs (Physician Assistants and Nurse Practitioners) who all work together to provide you with the care you need, when you need it.  Your next appointment:   6 MONTHS  Provider:   Alean Kobus, MD

## 2024-07-18 ENCOUNTER — Ambulatory Visit: Admitting: Physical Therapy

## 2024-07-22 ENCOUNTER — Ambulatory Visit: Admitting: Rehabilitative and Restorative Service Providers"

## 2024-07-24 ENCOUNTER — Ambulatory Visit: Admitting: Rehabilitative and Restorative Service Providers"

## 2024-07-30 ENCOUNTER — Ambulatory Visit: Attending: Physician Assistant | Admitting: Rehabilitative and Restorative Service Providers"

## 2024-07-30 ENCOUNTER — Encounter: Payer: Self-pay | Admitting: Rehabilitative and Restorative Service Providers"

## 2024-07-30 DIAGNOSIS — R293 Abnormal posture: Secondary | ICD-10-CM | POA: Insufficient documentation

## 2024-07-30 DIAGNOSIS — M545 Low back pain, unspecified: Secondary | ICD-10-CM | POA: Diagnosis not present

## 2024-07-30 DIAGNOSIS — R29898 Other symptoms and signs involving the musculoskeletal system: Secondary | ICD-10-CM | POA: Insufficient documentation

## 2024-07-30 DIAGNOSIS — M6281 Muscle weakness (generalized): Secondary | ICD-10-CM | POA: Diagnosis not present

## 2024-07-30 NOTE — Therapy (Signed)
 OUTPATIENT PHYSICAL THERAPY THORACOLUMBAR TREATMENT   Patient Name: Mary Freeman MRN: [id number] DOB:15-Nov-1951, 73 y.o., female Today's Date: 07/30/2024  END OF SESSION:  PT End of Session - 07/30/24 1448     Visit Number 5    Number of Visits 16    Date for PT Re-Evaluation 08/14/24    Authorization Type [employer] medicare copay $10    Progress Note Due on Visit 10    PT Start Time 1446    PT Stop Time 1534    PT Time Calculation (min) 48 min           Past Medical History:  Diagnosis Date   Abdominal pain 01/12/2021   Adjustment disorder with depressed mood 02/11/2021   Anxiety 02/11/2021   Bloating 01/12/2021   Cardiomyopathy (HCC) 03/28/2024   Chondromalacia patellae 06/11/2012   Chronic kidney disease, stage 3 (HCC)    Chronic kidney disease, stage 3 unspecified (HCC) 02/11/2021   Collagenous colitis 02/11/2021   Depression    Disorder of bursae and tendons in shoulder region 09/19/2011   Early satiety 01/12/2021   Gastroesophageal reflux disease 02/11/2021   Golfer's elbow 09/19/2011   Headache    migraines   Hypotension    Migraine without aura, not refractory 02/11/2021   Mild depression 02/11/2021   NSVT (nonsustained ventricular tachycardia) (HCC)    Osteopenia 02/11/2021   Palpitations 03/28/2024   PONV (postoperative nausea and vomiting)    Rotator cuff tear 12/28/2011   Rotator cuff tendinitis 12/28/2011   Status post arthroscopy of shoulder 04/16/2012   SVT (supraventricular tachycardia) (HCC)    Syncope 03/28/2024   Syncope and collapse    Past Surgical History:  Procedure Laterality Date   APPENDECTOMY     BREAST EXCISIONAL BIOPSY Right    benign   COLONOSCOPY     KNEE ARTHROSCOPY Right    x3   KNEE ARTHROSCOPY W/ MENISCAL REPAIR Left    LUMBAR LAMINECTOMY  1986   REDUCTION MAMMAPLASTY Bilateral    2006   ROTATOR CUFF REPAIR Right 2012   TONSILLECTOMY     TUBAL LIGATION     Patient Active Problem List   Diagnosis Date Noted    Depression    Headache    Hypotension    NSVT (nonsustained ventricular tachycardia) (HCC)    PONV (postoperative nausea and vomiting)    SVT (supraventricular tachycardia) (HCC)    Syncope and collapse    Palpitations 03/28/2024   Cardiomyopathy (HCC) 03/28/2024   Syncope 03/28/2024   Chronic kidney disease, stage 3 (HCC)    Anxiety 02/11/2021   Adjustment disorder with depressed mood 02/11/2021   Chronic kidney disease, stage 3 unspecified (HCC) 02/11/2021   Collagenous colitis 02/11/2021   Gastroesophageal reflux disease 02/11/2021   Migraine without aura, not refractory 02/11/2021   Mild depression 02/11/2021   Osteopenia 02/11/2021   Early satiety 01/12/2021   Bloating 01/12/2021   Abdominal pain 01/12/2021   Chondromalacia patellae 06/11/2012   Status post arthroscopy of shoulder 04/16/2012   Rotator cuff tear 12/28/2011   Rotator cuff tendinitis 12/28/2011   Disorder of bursae and tendons in shoulder region 09/19/2011   Golfer's elbow 09/19/2011    PCP: Mabel Pleas, PA   REFERRING PROVIDER: Mabel Pleas, PA  REFERRING DIAG: Gluteal pain; midback pain  Rationale for Evaluation and Treatment: Rehabilitation  THERAPY DIAG:  Acute left-sided low back pain without sciatica  Other symptoms and signs involving the musculoskeletal system  Abnormal posture  Muscle weakness (generalized)  ONSET  DATE: 12/13/23  SUBJECTIVE:                                                                                                                                                                                           SUBJECTIVE STATEMENT: 07/30/2024: Orie reports that her L mid to LB. She has not had a chance to do her exercises. She has less leg pain. Still feels like she has to be careful with how she moves. She has had increased symptoms following the loss of her daughter ~ 2 weeks. Goals of therapy remain appropriate. Will progress with exercises as tolerated.    Eval:  Patient reports that she has had pain in the L LB area for the past 6 months which was treated with chiropractic care. Since May the chiropractic care has not relieved symptoms. She has no known injury. She started having pain in the L glut in the past 6 weeks. She is improving but continues to have some pain, tingling in the L ankle and tightness in the L leg   PERTINENT HISTORY:  Lt shoulder scope RC, Lt 05/25/17 DCR/SAD/frayed RC/torn labrum; Rt shoulder pain; cervical pain 2018; LBP 2020; Rt knee pain 2022; low blood pressure now controlled with meds   PAIN:  Are you having pain? Yes: NPRS scale: 5/10; best in past week 0/10; worst 8/10  Pain location: L low to mid back Pain description: ache Aggravating factors: mowing; vacuuming Relieving factors: ice; heat; hot bath  PRECAUTIONS: None   WEIGHT BEARING RESTRICTIONS: No  FALLS:  Has patient fallen in last 6 months? No  LIVING ENVIRONMENT: Lives with: lives alone Lives in: house Stairs: Yes: External: 1 steps; none; Internal steps 14; rail on R going up   OCCUPATION: retired Engineer, civil (consulting); active with household chores; yard work; yoga 2x/wk; lift exercises 2x/wk   PATIENT GOALS: get rid of the pain   NEXT MD VISIT: 8/25  OBJECTIVE:  Note: Objective measures were completed at Evaluation unless otherwise noted.  DIAGNOSTIC FINDINGS:  None available for back and hip   PATIENT SURVEYS:  PSFS: THE PATIENT SPECIFIC FUNCTIONAL SCALE  Place score of 0-10 (0 = unable to perform activity and 10 = able to perform activity at the same level as before injury or problem)  Activity Date: 06/19/24    Vacuum  5    2. Rake  0    3. Lifting > 2 pounds  0    4.      Total Score 5      Total Score = Sum of activity scores/number of activities 5/3 = 1.67   Minimally Detectable Change: 3 points (  for single activity); 2 points (for average score)  [facility] (nd). The Patient Specific Functional Scale . Retrieved from  [email or web address]    SENSATION: Tingling in L ankle on an interment basis   MUSCLE LENGTH: Hamstrings: Right 65 deg; Left 70 deg Thomas test: WFL's  POSTURE: rounded shoulders, forward head, and increased thoracic kyphosis  PALPATION: tight L lats mid to low back; L gluts/piriformis    LUMBAR ROM:   AROM eval  Flexion 70% tight   Extension   Right lateral flexion 70% tight   Left lateral flexion   Right rotation   Left rotation    (Blank rows = not tested)  LOWER EXTREMITY ROM:   WFL's bilat   Active  Right eval Left eval  Hip flexion    Hip extension    Hip abduction    Hip adduction    Hip internal rotation    Hip external rotation    Knee flexion    Knee extension    Ankle dorsiflexion    Ankle plantarflexion    Ankle inversion    Ankle eversion     (Blank rows = not tested)  LOWER EXTREMITY MMT:    MMT Right eval Left eval  Hip flexion 5 4  Hip extension 5 4+  Hip abduction 5 4  Hip adduction    Hip internal rotation    Hip external rotation    Knee flexion    Knee extension    Ankle dorsiflexion    Ankle plantarflexion    Ankle inversion    Ankle eversion     (Blank rows = not tested)  LUMBAR SPECIAL TESTS:  Straight leg raise test: Negative and Slump test: Negative  FUNCTIONAL TESTS:   Single leg stance: R 10; L 2-3 sec   GAIT: Distance walked: 40 ft  Assistive device utilized: None Level of assistance: Complete Independence Comments: WFL's    [facility] Adult PT Treatment:                                                DATE: 07/30/24 Therapeutic Exercise: Supine  Piriformis stretch travell 30 sec x 3 Hamstring stretch with strap 30 sec x 3  Hip abductor stretch with strap hooklying 30 sec x 3  Hip adductor stretch with strap hooklying 30 sec x 3 Quadruped  Cat cow x 5   Child's pose 20 sec x 3  Sitting Standing  Manual Therapy:   Therapeutic Activity:  Sitting     Supine    Happy baby 3 x 15 sec  Standing    Modalities: MH thoracolumbar spine x 10 min  Self Care: Avoid lifting heavy objects Resume consistent exercises   [facility] Adult PT Treatment:                                                DATE: 07/08/24 Therapeutic Exercise: Bridge 2x8 w/ 2 sec hold cues for form and comfortable ROM Alternating; HS stretch w/ strap 2x30sec, adductor stretch w/ strap 2x30sec HEP discussion/education  Neuromuscular re-ed: Green band row x12; blue band 2x10 cues for posture Green band shoulder ext x3, discontinued d/t pain Red band short lever high>low row x3, discontinued d/t pain Seated  lat activation w/ scapular depression, 2x15  tactile cues to ensure lat activation Inc time throughout for education/discussion re: relevant anatomy/physiology and rationale for interventions, symptom behavior with postural work   PATIENT EDUCATION:  Education details: POC; HEP  Person educated: Patient Education method: Programmer, multimedia, Facilities manager, Actor cues, Verbal cues, and Handouts Education comprehension: verbalized understanding, returned demonstration, verbal cues required, tactile cues required, and needs further education  HOME EXERCISE PROGRAM: Access Code: [email or web address] URL: [email or web address] Date: 07/04/2024 Prepared by: Chriss Redel  Program Notes Lat stretch lying on back pull knees to chest hold with abdominal muscles or prop feet turn hands with palms facing your facepush hands over head keeping elbows toward each other relax arms overhead 30- 45 sec hold 3 reps   Exercises - Supine Piriformis Stretch with Leg Straight  - 2 x daily - 7 x weekly - 1 sets - 3 reps - 30 sec  hold - Supine Pelvic Floor Stretch  - 2 x daily - 7 x weekly - 1-2 sets - 3-5 reps - 15 sec hold - Hooklying Hamstring Stretch with Strap  - 2 x daily - 7 x weekly - 1 sets - 3 reps - 30 sec  hold - Supine Sciatic Nerve Glide  - 2 x daily - 7 x weekly - 1 sets - 8-10 reps - 1-2  sec  hold - Hip Adductors and Hamstring Stretch with Strap  - 2 x daily - 7 x weekly - 1 sets - 3 reps - 30 sec  hold - Cat Cow  - 2 x daily - 7 x weekly - 1 sets - 5-10 reps - 3-5 sec  hold - Cat Cow to Child's Pose  - 2 x daily - 7 x weekly - 1 sets - 3 reps - 30 sec  hold - Standing Bilateral Low Shoulder Row with Anchored Resistance  - 2 x daily - 7 x weekly - 1-3 sets - 10 reps - 2-3 sec  hold - Shoulder extension with resistance - Neutral  - 1 x daily - 7 x weekly - 1-2 sets - 10 reps - 3-5 sec  hold - Supine ITB Stretch with Strap  - 2 x daily - 7 x weekly - 1 sets - 3 reps - 30 sec  hold - Seated Sidebending  - 2 x daily - 7 x weekly - 1 sets - 3 reps - 30 sec  hold  ASSESSMENT:  CLINICAL IMPRESSION: 07/30/2024: Patient has experienced the loss of her daughter, Warren,  ~ 2 weeks ago. She has had a lot of stress and has been physically more active cleaning out her daughter's condo which involved packing and lifting and carrying. Treatment consisted of exercise review, manual work and moist heat to ease patient back into exercises. Encouraged return to exercises as indicated.    Eval: Patient is a 73 y.o. female who was seen today for physical therapy evaluation and treatment for L mid to low back and L posterior hip pain. Back pain has been present for ~ 6 months with increased pain in the L posterior hip/glut area in the past 2 months. Patient has pain with lifting, vacuuming and raking. She has tightness to palpation through the L latissimus dorsi; QL; gluts; piriformis. She has positive neural tension test L LE. Patient has limited trunk mobility and pain with functional activities as at times at rest. Patient responded well to stretch for lats and LE in treatment today. She will benefit from PT to address problems  identified.    GOALS: Goals reviewed with patient? Yes  SHORT TERM GOALS: Target date: 07/17/2024   Decrease pain in L mid to low back allowing patient to move through  normal functional positional changes without limitations. Baseline: Goal status: INITIAL  2.  Increase trunk ROM/mobility to [facility] and pain free  Baseline:  Goal status: INITIAL  3.  Patient independent in initial HEP  Baseline:  Goal status: INITIAL   LONG TERM GOALS: Target date: 08/14/2024   Patient reports decreased back and hip pain by 75-100% allowing her to return to all normal activities  Baseline:  Goal status: INITIAL  2.  5/5 strength L LE  Baseline:  Goal status: INITIAL  3.  Patient reports vacuuming with minimal to no increase in L back or hip pain  Baseline:  Goal status: INITIAL  4.  Patient reports ability to rake with minimal to no increase in back or hip pain > 1-2/10  Baseline:  Goal status: INITIAL  5.  Patient demonstrates and/or reports ability to lift 5 pounds with no increase in back or hip pain  Baseline:  Goal status: INITIAL  6.  PSFS: THE PATIENT SPECIFIC FUNCTIONAL SCALE  Place score of 0-10 (0 = unable to perform activity and 10 = able to perform activity at the same level as before injury or problem)  Activity Date: 06/19/24    Vacuum  5    2. Rake  0    3. Lifting > 2 pounds  0    4.      Total Score 5      Total Score = Sum of activity scores/number of activities 5/3 = 1.67  Baseline: 1.67 Goal status: INITIAL  7. Independent in advanced HEP   Baseline:   Coal status: INITIAL   PLAN:  PT FREQUENCY: 2x/week  PT DURATION: 8 weeks  PLANNED INTERVENTIONS: 97164- PT Re-evaluation, 97110-Therapeutic exercises, 97530- Therapeutic activity, [id number]- Neuromuscular re-education, 97535- Self Care, [id number]- Manual therapy, Patient/Family education, Balance training, Stair training, Taping, and Joint mobilization.  PLAN FOR NEXT SESSION: review and progress with exercise; continue with spine care education and ergonomic correction; manual work and modalities as indicated    Autumnrose Yore P. Ina PT, MPH 07/30/24 3:19 PM

## 2024-08-06 ENCOUNTER — Encounter: Payer: Self-pay | Admitting: Rehabilitative and Restorative Service Providers"

## 2024-08-06 ENCOUNTER — Ambulatory Visit: Admitting: Rehabilitative and Restorative Service Providers"

## 2024-08-06 DIAGNOSIS — R293 Abnormal posture: Secondary | ICD-10-CM

## 2024-08-06 DIAGNOSIS — M545 Low back pain, unspecified: Secondary | ICD-10-CM

## 2024-08-06 DIAGNOSIS — M6281 Muscle weakness (generalized): Secondary | ICD-10-CM

## 2024-08-06 DIAGNOSIS — R29898 Other symptoms and signs involving the musculoskeletal system: Secondary | ICD-10-CM | POA: Diagnosis not present

## 2024-08-06 NOTE — Therapy (Signed)
 OUTPATIENT PHYSICAL THERAPY THORACOLUMBAR TREATMENT   Patient Name: TONEE SILVERSTEIN MRN: [id number] DOB:07/28/1951, 73 y.o., female Today's Date: 08/06/2024  END OF SESSION:  PT End of Session - 08/06/24 1352     Visit Number 6    Number of Visits 16    Date for PT Re-Evaluation 08/14/24    Authorization Type [employer] medicare copay $10    Progress Note Due on Visit 10    PT Start Time 1353    PT Stop Time 1443    PT Time Calculation (min) 50 min    Activity Tolerance Patient tolerated treatment well           Past Medical History:  Diagnosis Date   Abdominal pain 01/12/2021   Adjustment disorder with depressed mood 02/11/2021   Anxiety 02/11/2021   Bloating 01/12/2021   Cardiomyopathy (HCC) 03/28/2024   Chondromalacia patellae 06/11/2012   Chronic kidney disease, stage 3 (HCC)    Chronic kidney disease, stage 3 unspecified (HCC) 02/11/2021   Collagenous colitis 02/11/2021   Depression    Disorder of bursae and tendons in shoulder region 09/19/2011   Early satiety 01/12/2021   Gastroesophageal reflux disease 02/11/2021   Golfer's elbow 09/19/2011   Headache    migraines   Hypotension    Migraine without aura, not refractory 02/11/2021   Mild depression 02/11/2021   NSVT (nonsustained ventricular tachycardia) (HCC)    Osteopenia 02/11/2021   Palpitations 03/28/2024   PONV (postoperative nausea and vomiting)    Rotator cuff tear 12/28/2011   Rotator cuff tendinitis 12/28/2011   Status post arthroscopy of shoulder 04/16/2012   SVT (supraventricular tachycardia) (HCC)    Syncope 03/28/2024   Syncope and collapse    Past Surgical History:  Procedure Laterality Date   APPENDECTOMY     BREAST EXCISIONAL BIOPSY Right    benign   COLONOSCOPY     KNEE ARTHROSCOPY Right    x3   KNEE ARTHROSCOPY W/ MENISCAL REPAIR Left    LUMBAR LAMINECTOMY  1986   REDUCTION MAMMAPLASTY Bilateral    2006   ROTATOR CUFF REPAIR Right 2012   TONSILLECTOMY     TUBAL LIGATION      Patient Active Problem List   Diagnosis Date Noted   Depression    Headache    Hypotension    NSVT (nonsustained ventricular tachycardia) (HCC)    PONV (postoperative nausea and vomiting)    SVT (supraventricular tachycardia) (HCC)    Syncope and collapse    Palpitations 03/28/2024   Cardiomyopathy (HCC) 03/28/2024   Syncope 03/28/2024   Chronic kidney disease, stage 3 (HCC)    Anxiety 02/11/2021   Adjustment disorder with depressed mood 02/11/2021   Chronic kidney disease, stage 3 unspecified (HCC) 02/11/2021   Collagenous colitis 02/11/2021   Gastroesophageal reflux disease 02/11/2021   Migraine without aura, not refractory 02/11/2021   Mild depression 02/11/2021   Osteopenia 02/11/2021   Early satiety 01/12/2021   Bloating 01/12/2021   Abdominal pain 01/12/2021   Chondromalacia patellae 06/11/2012   Status post arthroscopy of shoulder 04/16/2012   Rotator cuff tear 12/28/2011   Rotator cuff tendinitis 12/28/2011   Disorder of bursae and tendons in shoulder region 09/19/2011   Golfer's elbow 09/19/2011    PCP: Mabel Pleas, PA   REFERRING PROVIDER: Mabel Pleas, PA  REFERRING DIAG: Gluteal pain; midback pain  Rationale for Evaluation and Treatment: Rehabilitation  THERAPY DIAG:  Acute left-sided low back pain without sciatica  Other symptoms and signs involving the musculoskeletal system  Abnormal posture  Muscle weakness (generalized)  ONSET DATE: 12/13/23  SUBJECTIVE:                                                                                                                                                                                           SUBJECTIVE STATEMENT: 08/06/2024: Orie reports that her L mid to LB is improving. No pain in the leg. She returned to yoga this week and did well with a some soreness. (Dealing with daughter's sudden death ~ 3 weeks ago) Will progress with exercises as tolerated.    Eval: Patient reports that she has had  pain in the L LB area for the past 6 months which was treated with chiropractic care. Since May the chiropractic care has not relieved symptoms. She has no known injury. She started having pain in the L glut in the past 6 weeks. She is improving but continues to have some pain, tingling in the L ankle and tightness in the L leg   PERTINENT HISTORY:  Lt shoulder scope RC, Lt 05/25/17 DCR/SAD/frayed RC/torn labrum; Rt shoulder pain; cervical pain 2018; LBP 2020; Rt knee pain 2022; low blood pressure now controlled with meds   PAIN:  Are you having pain? Yes: NPRS scale: 1/10; best in past week 0/10; worst 8/10  Pain location: L low to mid back Pain description: ache Aggravating factors: mowing; vacuuming Relieving factors: ice; heat; hot bath  PRECAUTIONS: None   WEIGHT BEARING RESTRICTIONS: No  FALLS:  Has patient fallen in last 6 months? No  LIVING ENVIRONMENT: Lives with: lives alone Lives in: house Stairs: Yes: External: 1 steps; none; Internal steps 14; rail on R going up   OCCUPATION: retired Engineer, civil (consulting); active with household chores; yard work; yoga 2x/wk; lift exercises 2x/wk   PATIENT GOALS: get rid of the pain   NEXT MD VISIT: 8/25  OBJECTIVE:  Note: Objective measures were completed at Evaluation unless otherwise noted.  DIAGNOSTIC FINDINGS:  None available for back and hip   PATIENT SURVEYS:  PSFS: THE PATIENT SPECIFIC FUNCTIONAL SCALE  Place score of 0-10 (0 = unable to perform activity and 10 = able to perform activity at the same level as before injury or problem)  Activity Date: 06/19/24    Vacuum  5    2. Rake  0    3. Lifting > 2 pounds  0    4.      Total Score 5      Total Score = Sum of activity scores/number of activities 5/3 = 1.67   Minimally Detectable Change: 3 points (for single activity); 2 points (for average score)  [facility] (nd). The Patient Specific Functional Scale . Retrieved from  [email or web address]    SENSATION: Tingling in L ankle on an interment basis   MUSCLE LENGTH: Hamstrings: Right 65 deg; Left 70 deg Thomas test: WFL's  POSTURE: rounded shoulders, forward head, and increased thoracic kyphosis  PALPATION: tight L lats mid to low back; L gluts/piriformis    LUMBAR ROM:   AROM eval  Flexion 70% tight   Extension   Right lateral flexion 70% tight   Left lateral flexion   Right rotation   Left rotation    (Blank rows = not tested)  LOWER EXTREMITY ROM:   WFL's bilat   Active  Right eval Left eval  Hip flexion    Hip extension    Hip abduction    Hip adduction    Hip internal rotation    Hip external rotation    Knee flexion    Knee extension    Ankle dorsiflexion    Ankle plantarflexion    Ankle inversion    Ankle eversion     (Blank rows = not tested)  LOWER EXTREMITY MMT:    MMT Right eval Left eval  Hip flexion 5 4  Hip extension 5 4+  Hip abduction 5 4  Hip adduction    Hip internal rotation    Hip external rotation    Knee flexion    Knee extension    Ankle dorsiflexion    Ankle plantarflexion    Ankle inversion    Ankle eversion     (Blank rows = not tested)  LUMBAR SPECIAL TESTS:  Straight leg raise test: Negative and Slump test: Negative  FUNCTIONAL TESTS:   Single leg stance: R 10; L 2-3 sec   GAIT: Distance walked: 40 ft  Assistive device utilized: None Level of assistance: Complete Independence Comments: WFL's    [facility] Adult PT Treatment:                                                DATE: 08/06/24 Therapeutic Exercise: Supine  Piriformis stretch travell 30 sec x 3 Hamstring stretch with strap 30 sec x 3  Hip abductor stretch with strap hooklying 30 sec x 3  Hip adductor stretch with strap hooklying 30 sec x 3 Hip flexor stretch thomas position 30 sec x 2 R/L  Quadruped  Cat cow x 5   Child's pose 20 sec x 3  Sitting Standing  Manual  Therapy:   Therapeutic Activity: Supine   Bridge 5 sec x 10  Bridge with green TB above knees 3 sec x 10   Hip abduction alternating LE's green TB 3 sec x 10 R/L  Happy baby 3 x 15 sec  Sidelying   Clamshell green TB 3 sec x 10  Standing    Modalities:  Self Care:   [facility] Adult PT Treatment:                                                DATE: 07/30/24 Therapeutic Exercise: Supine  Piriformis stretch travell 30 sec x 3 Hamstring stretch with strap 30 sec x 3  Hip abductor stretch with strap hooklying 30 sec x 3  Hip adductor stretch with  strap hooklying 30 sec x 3 Quadruped  Cat cow x 5   Child's pose 20 sec x 3  Sitting Standing  Manual Therapy:   Therapeutic Activity:  Sitting     Supine   Happy baby 3 x 15 sec  Standing    Modalities: MH thoracolumbar spine x 10 min  Self Care: Avoid lifting heavy objects Resume consistent exercises   [facility] Adult PT Treatment:                                                DATE: 07/08/24 Therapeutic Exercise: Bridge 2x8 w/ 2 sec hold cues for form and comfortable ROM Alternating; HS stretch w/ strap 2x30sec, adductor stretch w/ strap 2x30sec HEP discussion/education  Neuromuscular re-ed: Green band row x12; blue band 2x10 cues for posture Green band shoulder ext x3, discontinued d/t pain Red band short lever high>low row x3, discontinued d/t pain Seated lat activation w/ scapular depression, 2x15  tactile cues to ensure lat activation Inc time throughout for education/discussion re: relevant anatomy/physiology and rationale for interventions, symptom behavior with postural work   PATIENT EDUCATION:  Education details: POC; HEP  Person educated: Patient Education method: Programmer, multimedia, Facilities manager, Actor cues, Verbal cues, and Handouts Education comprehension: verbalized understanding, returned demonstration, verbal cues required, tactile cues required, and needs further education  HOME EXERCISE PROGRAM: Access Code:  [email or web address] URL: [email or web address] Date: 08/06/2024 Prepared by: Annica Marinello  Program Notes Lat stretch lying on back pull knees to chest hold with abdominal muscles or prop feet turn hands with palms facing your facepush hands over head keeping elbows toward each other relax arms overhead 30- 45 sec hold 3 reps   Exercises - Supine Piriformis Stretch with Leg Straight  - 2 x daily - 7 x weekly - 1 sets - 3 reps - 30 sec  hold - Supine Pelvic Floor Stretch  - 2 x daily - 7 x weekly - 1-2 sets - 3-5 reps - 15 sec hold - Hooklying Hamstring Stretch with Strap  - 2 x daily - 7 x weekly - 1 sets - 3 reps - 30 sec  hold - Supine Sciatic Nerve Glide  - 2 x daily - 7 x weekly - 1 sets - 8-10 reps - 1-2 sec  hold - Hip Adductors and Hamstring Stretch with Strap  - 2 x daily - 7 x weekly - 1 sets - 3 reps - 30 sec  hold - Cat Cow  - 2 x daily - 7 x weekly - 1 sets - 5-10 reps - 3-5 sec  hold - Cat Cow to Child's Pose  - 2 x daily - 7 x weekly - 1 sets - 3 reps - 30 sec  hold - Standing Bilateral Low Shoulder Row with Anchored Resistance  - 2 x daily - 7 x weekly - 1-3 sets - 10 reps - 2-3 sec  hold - Shoulder extension with resistance - Neutral  - 1 x daily - 7 x weekly - 1-2 sets - 10 reps - 3-5 sec  hold - Supine ITB Stretch with Strap  - 2 x daily - 7 x weekly - 1 sets - 3 reps - 30 sec  hold - Seated Sidebending  - 2 x daily - 7 x weekly - 1 sets - 3 reps - 30 sec  hold - Supine Bridge with  Resistance Band  - 2 x daily - 7 x weekly - 1-2 sets - 10 reps - 5-10 sec  hold - Hooklying Isometric Clamshell  - 2 x daily - 7 x weekly - 1 sets - 10 reps - 3 sec  hold - Hip Flexor Stretch at Edge of Bed  - 2 x daily - 7 x weekly - 1 sets - 3 reps - 30 sec  hold  ASSESSMENT:  CLINICAL IMPRESSION: 08/06/2024: Patient has increased exercises at home this past week and went back to her yoga class yesterday. Treatment today consisted of exercise review with progression of strengthening for home  program. Encouraged consistent exercises.    Eval: Patient is a 73 y.o. female who was seen today for physical therapy evaluation and treatment for L mid to low back and L posterior hip pain. Back pain has been present for ~ 6 months with increased pain in the L posterior hip/glut area in the past 2 months. Patient has pain with lifting, vacuuming and raking. She has tightness to palpation through the L latissimus dorsi; QL; gluts; piriformis. She has positive neural tension test L LE. Patient has limited trunk mobility and pain with functional activities as at times at rest. Patient responded well to stretch for lats and LE in treatment today. She will benefit from PT to address problems identified.    GOALS: Goals reviewed with patient? Yes  SHORT TERM GOALS: Target date: 07/17/2024   Decrease pain in L mid to low back allowing patient to move through normal functional positional changes without limitations. Baseline: Goal status: INITIAL  2.  Increase trunk ROM/mobility to [facility] and pain free  Baseline:  Goal status: INITIAL  3.  Patient independent in initial HEP  Baseline:  Goal status: INITIAL   LONG TERM GOALS: Target date: 08/14/2024   Patient reports decreased back and hip pain by 75-100% allowing her to return to all normal activities  Baseline:  Goal status: INITIAL  2.  5/5 strength L LE  Baseline:  Goal status: INITIAL  3.  Patient reports vacuuming with minimal to no increase in L back or hip pain  Baseline:  Goal status: INITIAL  4.  Patient reports ability to rake with minimal to no increase in back or hip pain > 1-2/10  Baseline:  Goal status: INITIAL  5.  Patient demonstrates and/or reports ability to lift 5 pounds with no increase in back or hip pain  Baseline:  Goal status: INITIAL  6.  PSFS: THE PATIENT SPECIFIC FUNCTIONAL SCALE  Place score of 0-10 (0 = unable to perform activity and 10 = able to perform activity at the same level as before injury  or problem)  Activity Date: 06/19/24    Vacuum  5    2. Rake  0    3. Lifting > 2 pounds  0    4.      Total Score 5      Total Score = Sum of activity scores/number of activities 5/3 = 1.67  Baseline: 1.67 Goal status: INITIAL  7. Independent in advanced HEP   Baseline:   Coal status: INITIAL   PLAN:  PT FREQUENCY: 2x/week  PT DURATION: 8 weeks  PLANNED INTERVENTIONS: [id number]- PT Re-evaluation, 97110-Therapeutic exercises, 97530- Therapeutic activity, [id number]- Neuromuscular re-education, 97535- Self Care, [id number]- Manual therapy, Patient/Family education, Balance training, Stair training, Taping, and Joint mobilization.  PLAN FOR NEXT SESSION: review and progress with exercise; continue with spine care education and ergonomic correction; manual  work and modalities as indicated    Jordy Hewins P. Ina PT, MPH 08/06/24 2:35 PM

## 2024-08-13 ENCOUNTER — Encounter: Payer: Self-pay | Admitting: Rehabilitative and Restorative Service Providers"

## 2024-08-13 ENCOUNTER — Ambulatory Visit: Admitting: Rehabilitative and Restorative Service Providers"

## 2024-08-13 DIAGNOSIS — M545 Low back pain, unspecified: Secondary | ICD-10-CM | POA: Diagnosis not present

## 2024-08-13 DIAGNOSIS — R29898 Other symptoms and signs involving the musculoskeletal system: Secondary | ICD-10-CM

## 2024-08-13 DIAGNOSIS — M6281 Muscle weakness (generalized): Secondary | ICD-10-CM | POA: Diagnosis not present

## 2024-08-13 DIAGNOSIS — R293 Abnormal posture: Secondary | ICD-10-CM | POA: Diagnosis not present

## 2024-08-13 NOTE — Therapy (Addendum)
 OUTPATIENT PHYSICAL THERAPY THORACOLUMBAR TREATMENT PHYSICAL THERAPY DISCHARGE SUMMARY  Visits from Start of Care: 7  Current functional level related to goals / functional outcomes: See progress note for discharge status    Remaining deficits: Continued pain    Education / Equipment: HEP    Patient agrees to discharge. Patient goals were partially met. Patient is being discharged due to not returning since the last visit.  Nelly Scriven P. Ina PT, MPH 10/17/24 8:30 AM     Patient Name: Mary Freeman MRN: [id number] DOB:1950-12-31, 73 y.o., female Today's Date: 08/13/2024  END OF SESSION:  PT End of Session - 08/13/24 1407     Visit Number 7    Number of Visits 16    Date for PT Re-Evaluation 08/14/24    Authorization Type [employer] medicare copay $10    Progress Note Due on Visit 10    PT Start Time 1400    PT Stop Time 1445    PT Time Calculation (min) 45 min    Activity Tolerance Patient tolerated treatment well           Past Medical History:  Diagnosis Date   Abdominal pain 01/12/2021   Adjustment disorder with depressed mood 02/11/2021   Anxiety 02/11/2021   Bloating 01/12/2021   Cardiomyopathy (HCC) 03/28/2024   Chondromalacia patellae 06/11/2012   Chronic kidney disease, stage 3 (HCC)    Chronic kidney disease, stage 3 unspecified (HCC) 02/11/2021   Collagenous colitis 02/11/2021   Depression    Disorder of bursae and tendons in shoulder region 09/19/2011   Early satiety 01/12/2021   Gastroesophageal reflux disease 02/11/2021   Golfer's elbow 09/19/2011   Headache    migraines   Hypotension    Migraine without aura, not refractory 02/11/2021   Mild depression 02/11/2021   NSVT (nonsustained ventricular tachycardia) (HCC)    Osteopenia 02/11/2021   Palpitations 03/28/2024   PONV (postoperative nausea and vomiting)    Rotator cuff tear 12/28/2011   Rotator cuff tendinitis 12/28/2011   Status post arthroscopy of shoulder 04/16/2012   SVT  (supraventricular tachycardia) (HCC)    Syncope 03/28/2024   Syncope and collapse    Past Surgical History:  Procedure Laterality Date   APPENDECTOMY     BREAST EXCISIONAL BIOPSY Right    benign   COLONOSCOPY     KNEE ARTHROSCOPY Right    x3   KNEE ARTHROSCOPY W/ MENISCAL REPAIR Left    LUMBAR LAMINECTOMY  1986   REDUCTION MAMMAPLASTY Bilateral    2006   ROTATOR CUFF REPAIR Right 2012   TONSILLECTOMY     TUBAL LIGATION     Patient Active Problem List   Diagnosis Date Noted   Depression    Headache    Hypotension    NSVT (nonsustained ventricular tachycardia) (HCC)    PONV (postoperative nausea and vomiting)    SVT (supraventricular tachycardia) (HCC)    Syncope and collapse    Palpitations 03/28/2024   Cardiomyopathy (HCC) 03/28/2024   Syncope 03/28/2024   Chronic kidney disease, stage 3 (HCC)    Anxiety 02/11/2021   Adjustment disorder with depressed mood 02/11/2021   Chronic kidney disease, stage 3 unspecified (HCC) 02/11/2021   Collagenous colitis 02/11/2021   Gastroesophageal reflux disease 02/11/2021   Migraine without aura, not refractory 02/11/2021   Mild depression 02/11/2021   Osteopenia 02/11/2021   Early satiety 01/12/2021   Bloating 01/12/2021   Abdominal pain 01/12/2021   Chondromalacia patellae 06/11/2012   Status post arthroscopy of shoulder 04/16/2012  Rotator cuff tear 12/28/2011   Rotator cuff tendinitis 12/28/2011   Disorder of bursae and tendons in shoulder region 09/19/2011   Golfer's elbow 09/19/2011    PCP: Mabel Pleas, PA   REFERRING PROVIDER: Mabel Pleas, PA  REFERRING DIAG: Gluteal pain; midback pain  Rationale for Evaluation and Treatment: Rehabilitation  THERAPY DIAG:  Acute left-sided low back pain without sciatica  Other symptoms and signs involving the musculoskeletal system  Abnormal posture  Muscle weakness (generalized)  ONSET DATE: 12/13/23  SUBJECTIVE:                                                                                                                                                                                            SUBJECTIVE STATEMENT: 08/13/2024: Mary Freeman reports that her L mid to LB has improved but she now has significant increase in pain. She was standing in low heels for hours Saturday for her daughters celebration of life Saturday. She had onset of pain on Sunday. No pain in the leg. (Dealing with daughter's sudden death ~ 4 weeks ago) Will progress with exercises as tolerated.    Eval: Patient reports that she has had pain in the L LB area for the past 6 months which was treated with chiropractic care. Since May the chiropractic care has not relieved symptoms. She has no known injury. She started having pain in the L glut in the past 6 weeks. She is improving but continues to have some pain, tingling in the L ankle and tightness in the L leg   PERTINENT HISTORY:  Lt shoulder scope RC, Lt 05/25/17 DCR/SAD/frayed RC/torn labrum; Rt shoulder pain; cervical pain 2018; LBP 2020; Rt knee pain 2022; low blood pressure now controlled with meds   PAIN:  Are you having pain? Yes: NPRS scale: 8/10; best in past week 0/10; worst 8/10  Pain location: L low back Pain description: ache Aggravating factors: moving Relieving factors: nothing   PRECAUTIONS: None   WEIGHT BEARING RESTRICTIONS: No  FALLS:  Has patient fallen in last 6 months? No  LIVING ENVIRONMENT: Lives with: lives alone Lives in: house Stairs: Yes: External: 1 steps; none; Internal steps 14; rail on R going up   OCCUPATION: retired engineer, civil (consulting); active with household chores; yard work; yoga 2x/wk; lift exercises 2x/wk   PATIENT GOALS: get rid of the pain   NEXT MD VISIT: 8/25  OBJECTIVE:  Note: Objective measures were completed at Evaluation unless otherwise noted.  DIAGNOSTIC FINDINGS:  None available for back and hip   PATIENT SURVEYS:  PSFS: THE PATIENT SPECIFIC FUNCTIONAL SCALE  Place score of 0-10 (0  = unable to perform  activity and 10 = able to perform activity at the same level as before injury or problem)  Activity Date: 06/19/24    Vacuum  5    2. Rake  0    3. Lifting > 2 pounds  0    4.      Total Score 5      Total Score = Sum of activity scores/number of activities 5/3 = 1.67   Minimally Detectable Change: 3 points (for single activity); 2 points (for average score)  [facility] (nd). The Patient Specific Functional Scale . Retrieved from [email or web address]    SENSATION: Tingling in L ankle on an interment basis   MUSCLE LENGTH: Hamstrings: Right 65 deg; Left 70 deg Thomas test: WFL's  POSTURE: rounded shoulders, forward head, and increased thoracic kyphosis  PALPATION: tight L lats mid to low back; L gluts/piriformis    LUMBAR ROM:   AROM eval  Flexion 70% tight   Extension   Right lateral flexion 70% tight   Left lateral flexion   Right rotation   Left rotation    (Blank rows = not tested)  LOWER EXTREMITY ROM:   WFL's bilat   Active  Right eval Left eval  Hip flexion    Hip extension    Hip abduction    Hip adduction    Hip internal rotation    Hip external rotation    Knee flexion    Knee extension    Ankle dorsiflexion    Ankle plantarflexion    Ankle inversion    Ankle eversion     (Blank rows = not tested)  LOWER EXTREMITY MMT:    MMT Right eval Left eval  Hip flexion 5 4  Hip extension 5 4+  Hip abduction 5 4  Hip adduction    Hip internal rotation    Hip external rotation    Knee flexion    Knee extension    Ankle dorsiflexion    Ankle plantarflexion    Ankle inversion    Ankle eversion     (Blank rows = not tested)  LUMBAR SPECIAL TESTS:  Straight leg raise test: Negative and Slump test: Negative  FUNCTIONAL TESTS:   Single leg stance: R 10; L 2-3 sec   GAIT: Distance walked: 40 ft  Assistive device utilized: None Level of assistance: Complete  Independence Comments: WFL's    [facility] Adult PT Treatment:                                                DATE: 08/13/24 Therapeutic Exercise: Supine  Knee to chest 5 sec x 5 R/L  Knees and knees flexed shoulder flexion reaching overhead x 10  Hamstring stretch with strap 30 sec x 3  Hip abductor stretch with strap hooklying 30 sec x 3  Hip adductor stretch with strap hooklying 30 sec x 3 Piriformis stretch travell 30 sec x 3 Manual Therapy:   Therapeutic Activity: Supine   Trunk rotation in hooklying x 10 R/L  Double keen to chest 20 sec x 5  Repeated double knee to chest with knees apart 20 sec x 5   Hip abduction alternating LE's 3 sec x 10 R/L  Happy baby 3 x 15 sec  Sidelying   Clamshell green TB 3 sec x 10  Modalities: Moist heat lumbar spine during exercises  Self Care:   [facility] Adult PT Treatment:                                                DATE: 08/06/24 Therapeutic Exercise: Supine  Piriformis stretch travell 30 sec x 3 Hamstring stretch with strap 30 sec x 3  Hip abductor stretch with strap hooklying 30 sec x 3  Hip adductor stretch with strap hooklying 30 sec x 3 Hip flexor stretch thomas position 30 sec x 2 R/L  Quadruped  Cat cow x 5   Standing  Manual Therapy:   Therapeutic Activity: Supine   Bridge 5 sec x 10  Bridge with green TB above knees 3 sec x 10   Hip abduction alternating LE's green TB 3 sec x 10 R/L   Sidelying   Clamshell green TB 3 sec x 10      PATIENT EDUCATION:  Education details: POC; HEP  Person educated: Patient Education method: Programmer, Multimedia, Demonstration, Actor cues, Verbal cues, and Handouts Education comprehension: verbalized understanding, returned demonstration, verbal cues required, tactile cues required, and needs further education  HOME EXERCISE PROGRAM: Access Code: [email or web address] URL: [email or web address] Date: 08/06/2024 Prepared by: Anwen Cannedy  Program Notes Lat stretch lying on back pull knees to  chest hold with abdominal muscles or prop feet turn hands with palms facing your facepush hands over head keeping elbows toward each other relax arms overhead 30- 45 sec hold 3 reps   Exercises - Supine Piriformis Stretch with Leg Straight  - 2 x daily - 7 x weekly - 1 sets - 3 reps - 30 sec  hold - Supine Pelvic Floor Stretch  - 2 x daily - 7 x weekly - 1-2 sets - 3-5 reps - 15 sec hold - Hooklying Hamstring Stretch with Strap  - 2 x daily - 7 x weekly - 1 sets - 3 reps - 30 sec  hold - Supine Sciatic Nerve Glide  - 2 x daily - 7 x weekly - 1 sets - 8-10 reps - 1-2 sec  hold - Hip Adductors and Hamstring Stretch with Strap  - 2 x daily - 7 x weekly - 1 sets - 3 reps - 30 sec  hold - Cat Cow  - 2 x daily - 7 x weekly - 1 sets - 5-10 reps - 3-5 sec  hold - Cat Cow to Child's Pose  - 2 x daily - 7 x weekly - 1 sets - 3 reps - 30 sec  hold - Standing Bilateral Low Shoulder Row with Anchored Resistance  - 2 x daily - 7 x weekly - 1-3 sets - 10 reps - 2-3 sec  hold - Shoulder extension with resistance - Neutral  - 1 x daily - 7 x weekly - 1-2 sets - 10 reps - 3-5 sec  hold - Supine ITB Stretch with Strap  - 2 x daily - 7 x weekly - 1 sets - 3 reps - 30 sec  hold - Seated Sidebending  - 2 x daily - 7 x weekly - 1 sets - 3 reps - 30 sec  hold - Supine Bridge with Resistance Band  - 2 x daily - 7 x weekly - 1-2 sets - 10 reps - 5-10 sec  hold - Hooklying Isometric Clamshell  - 2 x daily - 7 x weekly - 1 sets -  10 reps - 3 sec  hold - Hip Flexor Stretch at Edge of Bed  - 2 x daily - 7 x weekly - 1 sets - 3 reps - 30 sec  hold  ASSESSMENT:  CLINICAL IMPRESSION: 08/13/2024: Patient returns with significant increase in lower back pain starting the day after standing in heels several hours. Pain has persisted for the past 2 days. Treatment consisted of gentle stretching and movement exercises. Encouraged consistent exercises as tolerated.    Eval: Patient is a 73 y.o. female who was seen today for physical  therapy evaluation and treatment for L mid to low back and L posterior hip pain. Back pain has been present for ~ 6 months with increased pain in the L posterior hip/glut area in the past 2 months. Patient has pain with lifting, vacuuming and raking. She has tightness to palpation through the L latissimus dorsi; QL; gluts; piriformis. She has positive neural tension test L LE. Patient has limited trunk mobility and pain with functional activities as at times at rest. Patient responded well to stretch for lats and LE in treatment today. She will benefit from PT to address problems identified.    GOALS: Goals reviewed with patient? Yes  SHORT TERM GOALS: Target date: 07/17/2024   Decrease pain in L mid to low back allowing patient to move through normal functional positional changes without limitations. Baseline: Goal status: met  2.  Increase trunk ROM/mobility to [facility] and pain free  Baseline:  Goal status: met  3.  Patient independent in initial HEP  Baseline:  Goal status: met   LONG TERM GOALS: Target date: 08/14/2024   Patient reports decreased back and hip pain by 75-100% allowing her to return to all normal activities  Baseline:  Goal status: INITIAL  2.  5/5 strength L LE  Baseline:  Goal status: INITIAL  3.  Patient reports vacuuming with minimal to no increase in L back or hip pain  Baseline:  Goal status: INITIAL  4.  Patient reports ability to rake with minimal to no increase in back or hip pain > 1-2/10  Baseline:  Goal status: INITIAL  5.  Patient demonstrates and/or reports ability to lift 5 pounds with no increase in back or hip pain  Baseline:  Goal status: INITIAL  6.  PSFS: THE PATIENT SPECIFIC FUNCTIONAL SCALE  Place score of 0-10 (0 = unable to perform activity and 10 = able to perform activity at the same level as before injury or problem)  Activity Date: 06/19/24    Vacuum  5    2. Rake  0    3. Lifting > 2 pounds  0    4.      Total Score 5       Total Score = Sum of activity scores/number of activities 5/3 = 1.67  Baseline: 1.67 Goal status: INITIAL  7. Independent in advanced HEP   Baseline:   Coal status: INITIAL   PLAN:  PT FREQUENCY: 2x/week  PT DURATION: 8 weeks  PLANNED INTERVENTIONS: 97164- PT Re-evaluation, 97110-Therapeutic exercises, 97530- Therapeutic activity, [id number]- Neuromuscular re-education, 97535- Self Care, [id number]- Manual therapy, Patient/Family education, Balance training, Stair training, Taping, and Joint mobilization.  PLAN FOR NEXT SESSION: review and progress with exercise; continue with spine care education and ergonomic correction; manual work and modalities as indicated    Aj Crunkleton P. Ina PT, MPH 08/13/24 2:07 PM

## 2024-08-14 ENCOUNTER — Ambulatory Visit
Admission: EM | Admit: 2024-08-14 | Discharge: 2024-08-14 | Disposition: A | Discharge status: Home / Self Care | Attending: Family Medicine | Admitting: Family Medicine

## 2024-08-14 ENCOUNTER — Other Ambulatory Visit: Payer: Self-pay

## 2024-08-14 ENCOUNTER — Telehealth: Payer: Self-pay

## 2024-08-14 DIAGNOSIS — M549 Dorsalgia, unspecified: Secondary | ICD-10-CM | POA: Diagnosis not present

## 2024-08-14 MED ORDER — HYDROCODONE-ACETAMINOPHEN 5-325 MG PO TABS: 1.0000 | ORAL_TABLET | Freq: Four times a day (QID) | ORAL | 0 refills | Status: DC | PRN

## 2024-08-14 MED ORDER — DIAZEPAM 5 MG PO TABS: 5.0000 mg | ORAL_TABLET | Freq: Every day | ORAL | 0 refills | Status: AC

## 2024-08-14 NOTE — Telephone Encounter (Signed)
 Received voicemail from pharmacy. Called pharmacy back. Sts they do not need anything from [facility] d/t having already speaking to primary care.

## 2024-08-14 NOTE — ED Provider Notes (Signed)
 [facility]    CSN: [id number] Arrival date & time: 08/14/24  1301      History   Chief Complaint Chief Complaint  Patient presents with   Back Pain    HPI Mary Freeman is a 73 y.o. female.   Patient is known to me from prior encounter.  She is here today with back pain.  She has been having problems with the muscles in her back off-and-on since July.  She has seen her primary care doctor.  She has engaged in physical therapist sessions.  Patient states that her physical therapist recommended to her trying a muscle relaxer at bedtime to help with her treatment.  Patient was unable to reach her family doctor to obtain this    Past Medical History:  Diagnosis Date   Abdominal pain 01/12/2021   Adjustment disorder with depressed mood 02/11/2021   Anxiety 02/11/2021   Bloating 01/12/2021   Cardiomyopathy (HCC) 03/28/2024   Chondromalacia patellae 06/11/2012   Chronic kidney disease, stage 3 (HCC)    Chronic kidney disease, stage 3 unspecified (HCC) 02/11/2021   Collagenous colitis 02/11/2021   Depression    Disorder of bursae and tendons in shoulder region 09/19/2011   Early satiety 01/12/2021   Gastroesophageal reflux disease 02/11/2021   Golfer's elbow 09/19/2011   Headache    migraines   Hypotension    Migraine without aura, not refractory 02/11/2021   Mild depression 02/11/2021   NSVT (nonsustained ventricular tachycardia) (HCC)    Osteopenia 02/11/2021   Palpitations 03/28/2024   PONV (postoperative nausea and vomiting)    Rotator cuff tear 12/28/2011   Rotator cuff tendinitis 12/28/2011   Status post arthroscopy of shoulder 04/16/2012   SVT (supraventricular tachycardia) (HCC)    Syncope 03/28/2024   Syncope and collapse     Patient Active Problem List   Diagnosis Date Noted   Depression    Headache    Hypotension    NSVT (nonsustained ventricular tachycardia) (HCC)    PONV (postoperative nausea and vomiting)    SVT (supraventricular  tachycardia) (HCC)    Syncope and collapse    Palpitations 03/28/2024   Cardiomyopathy (HCC) 03/28/2024   Syncope 03/28/2024   Chronic kidney disease, stage 3 (HCC)    Anxiety 02/11/2021   Adjustment disorder with depressed mood 02/11/2021   Chronic kidney disease, stage 3 unspecified (HCC) 02/11/2021   Collagenous colitis 02/11/2021   Gastroesophageal reflux disease 02/11/2021   Migraine without aura, not refractory 02/11/2021   Mild depression 02/11/2021   Osteopenia 02/11/2021   Early satiety 01/12/2021   Bloating 01/12/2021   Abdominal pain 01/12/2021   Chondromalacia patellae 06/11/2012   Status post arthroscopy of shoulder 04/16/2012   Rotator cuff tear 12/28/2011   Rotator cuff tendinitis 12/28/2011   Disorder of bursae and tendons in shoulder region 09/19/2011   Golfer's elbow 09/19/2011    Past Surgical History:  Procedure Laterality Date   APPENDECTOMY     BREAST EXCISIONAL BIOPSY Right    benign   COLONOSCOPY     KNEE ARTHROSCOPY Right    x3   KNEE ARTHROSCOPY W/ MENISCAL REPAIR Left    LUMBAR LAMINECTOMY  1986   REDUCTION MAMMAPLASTY Bilateral    2006   ROTATOR CUFF REPAIR Right 2012   TONSILLECTOMY     TUBAL LIGATION      OB History     Gravida  3   Para  3   Term  3   Preterm  AB      Living         SAB      IAB      Ectopic      Multiple      Live Births  3            Home Medications    Prior to Admission medications   Medication Sig Start Date End Date Taking? Authorizing Provider  diazepam  (VALIUM ) 5 MG tablet Take 1 tablet (5 mg total) by mouth at bedtime. As muscle relaxer.  Do not take with Ativan 08/14/24  Yes Maranda Jamee Jacob, MD  HYDROcodone -acetaminophen  (NORCO/VICODIN) 5-325 MG tablet Take 1-2 tablets by mouth every 6 (six) hours as needed. 08/14/24  Yes Maranda Jamee Jacob, MD  LORazepam (ATIVAN) 0.5 MG tablet Take 0.5 mg by mouth at bedtime. prn   Yes [provider]  b complex vitamins capsule  Take 1 capsule by mouth daily.    [provider]  Budesonide (ENTOCORT EC PO) Take 3 capsules by mouth daily.    [provider]  Calcium-Magnesium (CAL-MAG PO) Take 1 tablet by mouth 2 (two) times daily. 1000-800 mg    [provider]  cholecalciferol (VITAMIN D) 1000 units tablet Take 1,000 Units by mouth daily.    [provider]  metoprolol  succinate (TOPROL -XL) 25 MG 24 hr tablet Take 0.5 tablets (12.5 mg total) by mouth daily. Take with or immediately following a meal. 03/28/24 06/26/24  Madireddy, Alean SAUNDERS, MD  Multiple Vitamins-Minerals (MULTIVITAMIN WITH MINERALS) tablet Take 1 tablet by mouth daily.    [provider]  rizatriptan (MAXALT) 10 MG tablet Take 10 mg by mouth as needed for migraine. May repeat in 2 hours if needed    [provider]  valACYclovir (VALTREX) 500 MG tablet Take 500 mg by mouth 2 (two) times daily as needed. 02/16/24   [provider]    Family History Family History  Problem Relation Age of Onset   Cancer Father        lung   Cancer Sister        breast   Breast cancer Sister 38   Alcoholism Sister    Alcoholism Mother    Alcoholism Brother    Cancer Sister    Breast cancer Sister 26   Alcoholism Daughter    Alcoholism Daughter     Social History Social History   Tobacco Use   Smoking status: Never   Smokeless tobacco: Never  Vaping Use   Vaping status: Never Used  Substance Use Topics   Alcohol use: Not Currently    Comment: very rare    Drug use: No     Allergies   Morphine and codeine , Dilaudid [hydromorphone], Diphenhydramine, Prednisone, Stadol [butorphanol], Sulfa antibiotics, Talwin [pentazocine], Benadryl [diphenhydramine hcl], Flagyl  [metronidazole ], Melatonin, Nsaids, Solu-medrol  [methylprednisolone ], Tyloxapol, Amitriptyline, Butorphanol tartrate, Oxycodone-acetaminophen , and Promethazine    Review of Systems Review of Systems See HPI  Physical Exam Triage  Vital Signs ED Triage Vitals  Encounter Vitals Group     BP 08/14/24 1313 102/67     Girls Systolic BP Percentile --      Girls Diastolic BP Percentile --      Boys Systolic BP Percentile --      Boys Diastolic BP Percentile --      Pulse Rate 08/14/24 1313 90     Resp 08/14/24 1313 16     Temp 08/14/24 1313 97.6 F (36.4 C)     Temp src --  SpO2 08/14/24 1313 98 %     Weight --      Height --      Head Circumference --      Peak Flow --      Pain Score 08/14/24 1317 8     Pain Loc --      Pain Education --      Exclude from Growth Chart --    No data found.  Updated Vital Signs BP 102/67   Pulse 90   Temp 97.6 F (36.4 C)   Resp 16   SpO2 98%      Physical Exam Constitutional:      General: She is not in acute distress.    Appearance: She is well-developed and normal weight.     Comments: Patient has emotional lability.  Easily tearful.  Grief reaction due to recent death of her daughter  HENT:     Head: Normocephalic and atraumatic.  Eyes:     Conjunctiva/sclera: Conjunctivae normal.     Pupils: Pupils are equal, round, and reactive to light.  Cardiovascular:     Rate and Rhythm: Normal rate.  Pulmonary:     Effort: Pulmonary effort is normal. No respiratory distress.  Abdominal:     General: There is no distension.     Palpations: Abdomen is soft.  Musculoskeletal:        General: Tenderness present. Normal range of motion.     Cervical back: Normal range of motion.     Comments: Tenderness in the left lower thoracic and lumbar column of muscles.  No focal neurofindings  Skin:    General: Skin is warm and dry.  Neurological:     Mental Status: She is alert.      UC Treatments / Results  Labs (all labs ordered are listed, but only abnormal results are displayed) Labs Reviewed - No data to display  EKG   Radiology No results found.  Procedures Procedures (including critical care time)  Medications Ordered in UC Medications - No data  to display  Initial Impression / Assessment and Plan / UC Course  I have reviewed the triage vital signs and the nursing notes.  Pertinent labs & imaging results that were available during my care of the patient were reviewed by me and considered in my medical decision making (see chart for details).     Final Clinical Impressions(s) / UC Diagnoses   Final diagnoses:  Musculoskeletal back pain     Discharge Instructions      May continue ice or heat to painful muscles Take Tylenol  for moderate pain Take hydrocodone  if needed for severe pain.  Do not drive on hydrocodone  May take diazepam  (Valium ) at bedtime.  This is a good muscle relaxer Hold off on Ativan while taking diazepam  and hydrocodone  See your primary care doctor if not improved   ED Prescriptions     Medication Sig Dispense Auth. Provider   diazepam  (VALIUM ) 5 MG tablet Take 1 tablet (5 mg total) by mouth at bedtime. As muscle relaxer.  Do not take with Ativan 10 tablet Maranda Jamee Jacob, MD   HYDROcodone -acetaminophen  (NORCO/VICODIN) 5-325 MG tablet Take 1-2 tablets by mouth every 6 (six) hours as needed. 10 tablet Maranda Jamee Jacob, MD      I have reviewed the PDMP during this encounter.   Maranda Jamee Jacob, MD 08/14/24 1344

## 2024-08-14 NOTE — Discharge Instructions (Signed)
 May continue ice or heat to painful muscles Take Tylenol  for moderate pain Take hydrocodone  if needed for severe pain.  Do not drive on hydrocodone  May take diazepam  (Valium ) at bedtime.  This is a good muscle relaxer Hold off on Ativan while taking diazepam  and hydrocodone  See your primary care doctor if not improved

## 2024-08-14 NOTE — ED Triage Notes (Signed)
 Woke up Sunday with left flank pain. Had physical therapy yesterday (twice), once this morning. PT suggested pt get physician to order muscle relaxer, physician is not in office so pt presented here to get a muscle relaxer or a shot. Daughter recently died, was cleaning out her condo over the past few weeks. No dysuria. Has used tylenol  which is not helping. Cannot take nsaids d/t low gfr.

## 2024-08-19 ENCOUNTER — Ambulatory Visit
Admission: EM | Admit: 2024-08-19 | Discharge: 2024-08-19 | Disposition: A | Discharge status: Home / Self Care | Attending: Family Medicine | Admitting: Family Medicine

## 2024-08-19 ENCOUNTER — Encounter: Payer: Self-pay | Admitting: Emergency Medicine

## 2024-08-19 ENCOUNTER — Ambulatory Visit (INDEPENDENT_AMBULATORY_CARE_PROVIDER_SITE_OTHER)

## 2024-08-19 DIAGNOSIS — M545 Low back pain, unspecified: Secondary | ICD-10-CM

## 2024-08-19 DIAGNOSIS — M5136 Other intervertebral disc degeneration, lumbar region with discogenic back pain only: Secondary | ICD-10-CM | POA: Diagnosis not present

## 2024-08-19 DIAGNOSIS — M48061 Spinal stenosis, lumbar region without neurogenic claudication: Secondary | ICD-10-CM | POA: Diagnosis not present

## 2024-08-19 MED ORDER — HYDROCODONE-ACETAMINOPHEN 5-325 MG PO TABS: 1.0000 | ORAL_TABLET | Freq: Four times a day (QID) | ORAL | 0 refills | Status: AC | PRN

## 2024-08-19 NOTE — ED Provider Notes (Signed)
 [facility]    CSN: [id number] Arrival date & time: 08/19/24  1530      History   Chief Complaint Chief Complaint  Patient presents with   Back Pain    HPI Mary Freeman is a 73 y.o. female.   Mary Freeman is back for follow-up.  She did not fill the prescriptions I gave her at her last visit because she discussed with her physician and decided not to.  She states that the pharmacy would not fill the hydrocodone , unknown reason.  I did check her record in the [place]  narcotic database and she does not have any suspicion of overuse She is here because she still has back pain.  She would like x-rays.  She also requests that I refill the pain medication she did not fill last time.  I did confirm with the pharmacy that she never filled it. Patient had a lumbar laminectomy in the 80s.  States she does not usually have back pain, but her back's been bothering her now for months    Past Medical History:  Diagnosis Date   Abdominal pain 01/12/2021   Adjustment disorder with depressed mood 02/11/2021   Anxiety 02/11/2021   Bloating 01/12/2021   Cardiomyopathy (HCC) 03/28/2024   Chondromalacia patellae 06/11/2012   Chronic kidney disease, stage 3 (HCC)    Chronic kidney disease, stage 3 unspecified (HCC) 02/11/2021   Collagenous colitis 02/11/2021   Depression    Disorder of bursae and tendons in shoulder region 09/19/2011   Early satiety 01/12/2021   Gastroesophageal reflux disease 02/11/2021   Golfer's elbow 09/19/2011   Headache    migraines   Hypotension    Migraine without aura, not refractory 02/11/2021   Mild depression 02/11/2021   NSVT (nonsustained ventricular tachycardia) (HCC)    Osteopenia 02/11/2021   Palpitations 03/28/2024   PONV (postoperative nausea and vomiting)    Rotator cuff tear 12/28/2011   Rotator cuff tendinitis 12/28/2011   Status post arthroscopy of shoulder 04/16/2012   SVT (supraventricular tachycardia)    Syncope 03/28/2024    Syncope and collapse     Patient Active Problem List   Diagnosis Date Noted   Depression    Headache    Hypotension    NSVT (nonsustained ventricular tachycardia) (HCC)    PONV (postoperative nausea and vomiting)    SVT (supraventricular tachycardia)    Syncope and collapse    Palpitations 03/28/2024   Cardiomyopathy (HCC) 03/28/2024   Syncope 03/28/2024   Chronic kidney disease, stage 3 (HCC)    Anxiety 02/11/2021   Adjustment disorder with depressed mood 02/11/2021   Chronic kidney disease, stage 3 unspecified (HCC) 02/11/2021   Collagenous colitis 02/11/2021   Gastroesophageal reflux disease 02/11/2021   Migraine without aura, not refractory 02/11/2021   Mild depression 02/11/2021   Osteopenia 02/11/2021   Early satiety 01/12/2021   Bloating 01/12/2021   Abdominal pain 01/12/2021   Chondromalacia patellae 06/11/2012   Status post arthroscopy of shoulder 04/16/2012   Rotator cuff tear 12/28/2011   Rotator cuff tendinitis 12/28/2011   Disorder of bursae and tendons in shoulder region 09/19/2011   Golfer's elbow 09/19/2011    Past Surgical History:  Procedure Laterality Date   APPENDECTOMY     BREAST EXCISIONAL BIOPSY Right    benign   COLONOSCOPY     KNEE ARTHROSCOPY Right    x3   KNEE ARTHROSCOPY W/ MENISCAL REPAIR Left    LUMBAR LAMINECTOMY  1986   REDUCTION MAMMAPLASTY Bilateral  2006   ROTATOR CUFF REPAIR Right 2012   TONSILLECTOMY     TUBAL LIGATION      OB History     Gravida  3   Para  3   Term  3   Preterm      AB      Living         SAB      IAB      Ectopic      Multiple      Live Births  3            Home Medications    Prior to Admission medications   Medication Sig Start Date End Date Taking? Authorizing Provider  HYDROcodone -acetaminophen  (NORCO/VICODIN) 5-325 MG tablet Take 1 tablet by mouth every 6 (six) hours as needed. 08/19/24  Yes Maranda Jamee Jacob, MD  b complex vitamins capsule Take 1 capsule by mouth  daily.    [provider]  Budesonide (ENTOCORT EC PO) Take 3 capsules by mouth daily.    [provider]  Calcium-Magnesium (CAL-MAG PO) Take 1 tablet by mouth 2 (two) times daily. 1000-800 mg    [provider]  cholecalciferol (VITAMIN D) 1000 units tablet Take 1,000 Units by mouth daily.    [provider]  diazepam  (VALIUM ) 5 MG tablet Take 1 tablet (5 mg total) by mouth at bedtime. As muscle relaxer.  Do not take with Ativan 08/14/24   Maranda Jamee Jacob, MD  LORazepam (ATIVAN) 0.5 MG tablet Take 0.5 mg by mouth at bedtime. prn    [provider]  metoprolol  succinate (TOPROL -XL) 25 MG 24 hr tablet Take 0.5 tablets (12.5 mg total) by mouth daily. Take with or immediately following a meal. 03/28/24 06/26/24  Madireddy, Alean SAUNDERS, MD  Multiple Vitamins-Minerals (MULTIVITAMIN WITH MINERALS) tablet Take 1 tablet by mouth daily.    [provider]  rizatriptan (MAXALT) 10 MG tablet Take 10 mg by mouth as needed for migraine. May repeat in 2 hours if needed    [provider]  valACYclovir (VALTREX) 500 MG tablet Take 500 mg by mouth 2 (two) times daily as needed. 02/16/24   [provider]    Family History Family History  Problem Relation Age of Onset   Cancer Father        lung   Cancer Sister        breast   Breast cancer Sister 4   Alcoholism Sister    Alcoholism Mother    Alcoholism Brother    Cancer Sister    Breast cancer Sister 33   Alcoholism Daughter    Alcoholism Daughter     Social History Social History   Tobacco Use   Smoking status: Never   Smokeless tobacco: Never  Vaping Use   Vaping status: Never Used  Substance Use Topics   Alcohol use: Not Currently    Comment: very rare    Drug use: No     Allergies   Morphine and codeine , Dilaudid [hydromorphone], Diphenhydramine, Prednisone, Stadol [butorphanol], Sulfa antibiotics, Talwin [pentazocine], Benadryl [diphenhydramine hcl], Flagyl   [metronidazole ], Melatonin, Nsaids, Solu-medrol  [methylprednisolone ], Tyloxapol, Amitriptyline, Butorphanol tartrate, Oxycodone-acetaminophen , and Promethazine    Review of Systems Review of Systems See HPI  Physical Exam Triage Vital Signs ED Triage Vitals  Encounter Vitals Group     BP 08/19/24 1539 (!) 109/52     Girls Systolic BP Percentile --      Girls Diastolic BP Percentile --  Boys Systolic BP Percentile --      Boys Diastolic BP Percentile --      Pulse Rate 08/19/24 1539 (!) 58     Resp --      Temp 08/19/24 1539 98 F (36.7 C)     Temp Source 08/19/24 1539 Oral     SpO2 08/19/24 1539 98 %     Weight --      Height --      Head Circumference --      Peak Flow --      Pain Score 08/19/24 1542 8     Pain Loc --      Pain Education --      Exclude from Growth Chart --    No data found.  Updated Vital Signs BP (!) 109/52 (BP Location: Right Arm)   Pulse (!) 58   Temp 98 F (36.7 C) (Oral)   SpO2 98%      Physical Exam Constitutional:      General: She is not in acute distress.    Appearance: She is well-developed.  HENT:     Head: Normocephalic and atraumatic.  Eyes:     Conjunctiva/sclera: Conjunctivae normal.     Pupils: Pupils are equal, round, and reactive to light.  Cardiovascular:     Rate and Rhythm: Normal rate.  Pulmonary:     Effort: Pulmonary effort is normal. No respiratory distress.  Abdominal:     General: There is no distension.     Palpations: Abdomen is soft.  Musculoskeletal:        General: Tenderness present. Normal range of motion.     Cervical back: Normal range of motion.     Comments: Mild tenderness diffusely in the lumbar muscles in the SI region posteriorly.  No bony tenderness.  Well-healed lumbar scar.  Range of motion is normal.  Neuroexam is nonfocal  Skin:    General: Skin is warm and dry.  Neurological:     Mental Status: She is alert.      UC Treatments / Results  Labs (all labs ordered are listed, but  only abnormal results are displayed) Labs Reviewed - No data to display  EKG   Radiology DG Lumbar Spine Complete Result Date: 08/19/2024 CLINICAL DATA:  Low back pain EXAM: LUMBAR SPINE - COMPLETE 4+ VIEW COMPARISON:  CT abdomen and pelvis 03/05/2021. FINDINGS: There is no acute fracture or dislocation identified. Spinal alignment is normal. There is mild disc space narrowing at L4-L5 and L5-S1 similar to the prior study. IMPRESSION: Stable mild degenerative disc disease at L4-L5 and L5-S1. Electronically Signed   By: Greig Pique M.D.   On: 08/19/2024 16:38    Procedures Procedures (including critical care time)  Medications Ordered in UC Medications - No data to display  Initial Impression / Assessment and Plan / UC Course  I have reviewed the triage vital signs and the nursing notes.  Pertinent labs & imaging results that were available during my care of the patient were reviewed by me and considered in my medical decision making (see chart for details).     Final Clinical Impressions(s) / UC Diagnoses   Final diagnoses:  Acute midline low back pain without sciatica     Discharge Instructions      Limit your activity when your back is painful Use ice or heat to painful back muscles Take hydrocodone  if needed for severe pain.  Do not drive on hydrocodone .  Do not take hydrocodone  with  lorazepam Follow-up with your primary care doctor   ED Prescriptions     Medication Sig Dispense Auth. Provider   HYDROcodone -acetaminophen  (NORCO/VICODIN) 5-325 MG tablet Take 1 tablet by mouth every 6 (six) hours as needed. 10 tablet Maranda Jamee Jacob, MD      I have reviewed the PDMP during this encounter.   Maranda Jamee Jacob, MD 08/19/24 [phone]

## 2024-08-19 NOTE — Discharge Instructions (Signed)
 Limit your activity when your back is painful Use ice or heat to painful back muscles Take hydrocodone  if needed for severe pain.  Do not drive on hydrocodone .  Do not take hydrocodone  with lorazepam Follow-up with your primary care doctor

## 2024-08-19 NOTE — ED Triage Notes (Signed)
 Pt states she is still having left sided back pain. States she did not take the rx that was prescribed at her visit last week because her PCP did not want her to take it.

## 2024-08-20 DIAGNOSIS — R92333 Mammographic heterogeneous density, bilateral breasts: Secondary | ICD-10-CM | POA: Diagnosis not present

## 2024-08-20 DIAGNOSIS — Z780 Asymptomatic menopausal state: Secondary | ICD-10-CM | POA: Diagnosis not present

## 2024-08-20 DIAGNOSIS — Z1231 Encounter for screening mammogram for malignant neoplasm of breast: Secondary | ICD-10-CM | POA: Diagnosis not present

## 2024-08-20 DIAGNOSIS — M85852 Other specified disorders of bone density and structure, left thigh: Secondary | ICD-10-CM | POA: Diagnosis not present

## 2024-08-21 ENCOUNTER — Ambulatory Visit (HOSPITAL_COMMUNITY): Payer: Self-pay

## 2024-08-21 DIAGNOSIS — M9904 Segmental and somatic dysfunction of sacral region: Secondary | ICD-10-CM | POA: Diagnosis not present

## 2024-08-21 DIAGNOSIS — M5137 Other intervertebral disc degeneration, lumbosacral region with discogenic back pain only: Secondary | ICD-10-CM | POA: Diagnosis not present

## 2024-08-21 DIAGNOSIS — M5032 Other cervical disc degeneration, mid-cervical region, unspecified level: Secondary | ICD-10-CM | POA: Diagnosis not present

## 2024-08-21 DIAGNOSIS — M9901 Segmental and somatic dysfunction of cervical region: Secondary | ICD-10-CM | POA: Diagnosis not present

## 2024-08-21 DIAGNOSIS — M5136 Other intervertebral disc degeneration, lumbar region with discogenic back pain only: Secondary | ICD-10-CM | POA: Diagnosis not present

## 2024-08-21 DIAGNOSIS — M9902 Segmental and somatic dysfunction of thoracic region: Secondary | ICD-10-CM | POA: Diagnosis not present

## 2024-08-21 DIAGNOSIS — M5134 Other intervertebral disc degeneration, thoracic region: Secondary | ICD-10-CM | POA: Diagnosis not present

## 2024-08-21 DIAGNOSIS — M9903 Segmental and somatic dysfunction of lumbar region: Secondary | ICD-10-CM | POA: Diagnosis not present

## 2024-08-22 ENCOUNTER — Ambulatory Visit: Admitting: Rehabilitative and Restorative Service Providers"

## 2024-08-23 DIAGNOSIS — M5032 Other cervical disc degeneration, mid-cervical region, unspecified level: Secondary | ICD-10-CM | POA: Diagnosis not present

## 2024-08-23 DIAGNOSIS — M9902 Segmental and somatic dysfunction of thoracic region: Secondary | ICD-10-CM | POA: Diagnosis not present

## 2024-08-23 DIAGNOSIS — M5134 Other intervertebral disc degeneration, thoracic region: Secondary | ICD-10-CM | POA: Diagnosis not present

## 2024-08-23 DIAGNOSIS — M9903 Segmental and somatic dysfunction of lumbar region: Secondary | ICD-10-CM | POA: Diagnosis not present

## 2024-08-23 DIAGNOSIS — M5136 Other intervertebral disc degeneration, lumbar region with discogenic back pain only: Secondary | ICD-10-CM | POA: Diagnosis not present

## 2024-08-23 DIAGNOSIS — M9904 Segmental and somatic dysfunction of sacral region: Secondary | ICD-10-CM | POA: Diagnosis not present

## 2024-08-23 DIAGNOSIS — M5137 Other intervertebral disc degeneration, lumbosacral region with discogenic back pain only: Secondary | ICD-10-CM | POA: Diagnosis not present

## 2024-08-23 DIAGNOSIS — M9901 Segmental and somatic dysfunction of cervical region: Secondary | ICD-10-CM | POA: Diagnosis not present

## 2024-08-27 ENCOUNTER — Ambulatory Visit: Admitting: Rehabilitative and Restorative Service Providers"

## 2024-08-27 DIAGNOSIS — M5137 Other intervertebral disc degeneration, lumbosacral region with discogenic back pain only: Secondary | ICD-10-CM | POA: Diagnosis not present

## 2024-08-27 DIAGNOSIS — M9903 Segmental and somatic dysfunction of lumbar region: Secondary | ICD-10-CM | POA: Diagnosis not present

## 2024-08-27 DIAGNOSIS — M5032 Other cervical disc degeneration, mid-cervical region, unspecified level: Secondary | ICD-10-CM | POA: Diagnosis not present

## 2024-08-27 DIAGNOSIS — M9902 Segmental and somatic dysfunction of thoracic region: Secondary | ICD-10-CM | POA: Diagnosis not present

## 2024-08-27 DIAGNOSIS — M5134 Other intervertebral disc degeneration, thoracic region: Secondary | ICD-10-CM | POA: Diagnosis not present

## 2024-08-27 DIAGNOSIS — M5136 Other intervertebral disc degeneration, lumbar region with discogenic back pain only: Secondary | ICD-10-CM | POA: Diagnosis not present

## 2024-08-27 DIAGNOSIS — M9901 Segmental and somatic dysfunction of cervical region: Secondary | ICD-10-CM | POA: Diagnosis not present

## 2024-08-27 DIAGNOSIS — M9904 Segmental and somatic dysfunction of sacral region: Secondary | ICD-10-CM | POA: Diagnosis not present

## 2024-08-28 ENCOUNTER — Ambulatory Visit: Admitting: Rehabilitative and Restorative Service Providers"

## 2024-08-30 DIAGNOSIS — M5134 Other intervertebral disc degeneration, thoracic region: Secondary | ICD-10-CM | POA: Diagnosis not present

## 2024-08-30 DIAGNOSIS — M9903 Segmental and somatic dysfunction of lumbar region: Secondary | ICD-10-CM | POA: Diagnosis not present

## 2024-08-30 DIAGNOSIS — M9904 Segmental and somatic dysfunction of sacral region: Secondary | ICD-10-CM | POA: Diagnosis not present

## 2024-08-30 DIAGNOSIS — M5137 Other intervertebral disc degeneration, lumbosacral region with discogenic back pain only: Secondary | ICD-10-CM | POA: Diagnosis not present

## 2024-08-30 DIAGNOSIS — M5032 Other cervical disc degeneration, mid-cervical region, unspecified level: Secondary | ICD-10-CM | POA: Diagnosis not present

## 2024-08-30 DIAGNOSIS — M9902 Segmental and somatic dysfunction of thoracic region: Secondary | ICD-10-CM | POA: Diagnosis not present

## 2024-08-30 DIAGNOSIS — M9901 Segmental and somatic dysfunction of cervical region: Secondary | ICD-10-CM | POA: Diagnosis not present

## 2024-08-30 DIAGNOSIS — M5136 Other intervertebral disc degeneration, lumbar region with discogenic back pain only: Secondary | ICD-10-CM | POA: Diagnosis not present

## 2024-09-03 DIAGNOSIS — M9903 Segmental and somatic dysfunction of lumbar region: Secondary | ICD-10-CM | POA: Diagnosis not present

## 2024-09-03 DIAGNOSIS — M5137 Other intervertebral disc degeneration, lumbosacral region with discogenic back pain only: Secondary | ICD-10-CM | POA: Diagnosis not present

## 2024-09-03 DIAGNOSIS — M9904 Segmental and somatic dysfunction of sacral region: Secondary | ICD-10-CM | POA: Diagnosis not present

## 2024-09-03 DIAGNOSIS — M5032 Other cervical disc degeneration, mid-cervical region, unspecified level: Secondary | ICD-10-CM | POA: Diagnosis not present

## 2024-09-03 DIAGNOSIS — M5134 Other intervertebral disc degeneration, thoracic region: Secondary | ICD-10-CM | POA: Diagnosis not present

## 2024-09-03 DIAGNOSIS — M5136 Other intervertebral disc degeneration, lumbar region with discogenic back pain only: Secondary | ICD-10-CM | POA: Diagnosis not present

## 2024-09-03 DIAGNOSIS — M9902 Segmental and somatic dysfunction of thoracic region: Secondary | ICD-10-CM | POA: Diagnosis not present

## 2024-09-03 DIAGNOSIS — M9901 Segmental and somatic dysfunction of cervical region: Secondary | ICD-10-CM | POA: Diagnosis not present

## 2024-09-05 DIAGNOSIS — D485 Neoplasm of uncertain behavior of skin: Secondary | ICD-10-CM | POA: Diagnosis not present

## 2024-09-05 DIAGNOSIS — C44622 Squamous cell carcinoma of skin of right upper limb, including shoulder: Secondary | ICD-10-CM | POA: Diagnosis not present

## 2024-09-06 DIAGNOSIS — M9901 Segmental and somatic dysfunction of cervical region: Secondary | ICD-10-CM | POA: Diagnosis not present

## 2024-09-06 DIAGNOSIS — M5134 Other intervertebral disc degeneration, thoracic region: Secondary | ICD-10-CM | POA: Diagnosis not present

## 2024-09-06 DIAGNOSIS — M9904 Segmental and somatic dysfunction of sacral region: Secondary | ICD-10-CM | POA: Diagnosis not present

## 2024-09-06 DIAGNOSIS — M9902 Segmental and somatic dysfunction of thoracic region: Secondary | ICD-10-CM | POA: Diagnosis not present

## 2024-09-06 DIAGNOSIS — M5137 Other intervertebral disc degeneration, lumbosacral region with discogenic back pain only: Secondary | ICD-10-CM | POA: Diagnosis not present

## 2024-09-06 DIAGNOSIS — M9903 Segmental and somatic dysfunction of lumbar region: Secondary | ICD-10-CM | POA: Diagnosis not present

## 2024-09-06 DIAGNOSIS — M5136 Other intervertebral disc degeneration, lumbar region with discogenic back pain only: Secondary | ICD-10-CM | POA: Diagnosis not present

## 2024-09-06 DIAGNOSIS — M5032 Other cervical disc degeneration, mid-cervical region, unspecified level: Secondary | ICD-10-CM | POA: Diagnosis not present

## 2024-09-10 DIAGNOSIS — M9901 Segmental and somatic dysfunction of cervical region: Secondary | ICD-10-CM | POA: Diagnosis not present

## 2024-09-10 DIAGNOSIS — M9904 Segmental and somatic dysfunction of sacral region: Secondary | ICD-10-CM | POA: Diagnosis not present

## 2024-09-10 DIAGNOSIS — M5032 Other cervical disc degeneration, mid-cervical region, unspecified level: Secondary | ICD-10-CM | POA: Diagnosis not present

## 2024-09-10 DIAGNOSIS — M9903 Segmental and somatic dysfunction of lumbar region: Secondary | ICD-10-CM | POA: Diagnosis not present

## 2024-09-10 DIAGNOSIS — M5137 Other intervertebral disc degeneration, lumbosacral region with discogenic back pain only: Secondary | ICD-10-CM | POA: Diagnosis not present

## 2024-09-10 DIAGNOSIS — M9902 Segmental and somatic dysfunction of thoracic region: Secondary | ICD-10-CM | POA: Diagnosis not present

## 2024-09-10 DIAGNOSIS — M5134 Other intervertebral disc degeneration, thoracic region: Secondary | ICD-10-CM | POA: Diagnosis not present

## 2024-09-10 DIAGNOSIS — M5136 Other intervertebral disc degeneration, lumbar region with discogenic back pain only: Secondary | ICD-10-CM | POA: Diagnosis not present

## 2024-09-13 DIAGNOSIS — M5134 Other intervertebral disc degeneration, thoracic region: Secondary | ICD-10-CM | POA: Diagnosis not present

## 2024-09-13 DIAGNOSIS — M9904 Segmental and somatic dysfunction of sacral region: Secondary | ICD-10-CM | POA: Diagnosis not present

## 2024-09-13 DIAGNOSIS — M9901 Segmental and somatic dysfunction of cervical region: Secondary | ICD-10-CM | POA: Diagnosis not present

## 2024-09-13 DIAGNOSIS — M9902 Segmental and somatic dysfunction of thoracic region: Secondary | ICD-10-CM | POA: Diagnosis not present

## 2024-09-13 DIAGNOSIS — M5137 Other intervertebral disc degeneration, lumbosacral region with discogenic back pain only: Secondary | ICD-10-CM | POA: Diagnosis not present

## 2024-09-13 DIAGNOSIS — M5136 Other intervertebral disc degeneration, lumbar region with discogenic back pain only: Secondary | ICD-10-CM | POA: Diagnosis not present

## 2024-09-13 DIAGNOSIS — M5032 Other cervical disc degeneration, mid-cervical region, unspecified level: Secondary | ICD-10-CM | POA: Diagnosis not present

## 2024-09-13 DIAGNOSIS — M9903 Segmental and somatic dysfunction of lumbar region: Secondary | ICD-10-CM | POA: Diagnosis not present

## 2024-09-17 DIAGNOSIS — M5032 Other cervical disc degeneration, mid-cervical region, unspecified level: Secondary | ICD-10-CM | POA: Diagnosis not present

## 2024-09-17 DIAGNOSIS — M9901 Segmental and somatic dysfunction of cervical region: Secondary | ICD-10-CM | POA: Diagnosis not present

## 2024-09-17 DIAGNOSIS — M9903 Segmental and somatic dysfunction of lumbar region: Secondary | ICD-10-CM | POA: Diagnosis not present

## 2024-09-17 DIAGNOSIS — M9902 Segmental and somatic dysfunction of thoracic region: Secondary | ICD-10-CM | POA: Diagnosis not present

## 2024-09-17 DIAGNOSIS — M9904 Segmental and somatic dysfunction of sacral region: Secondary | ICD-10-CM | POA: Diagnosis not present

## 2024-09-17 DIAGNOSIS — M5134 Other intervertebral disc degeneration, thoracic region: Secondary | ICD-10-CM | POA: Diagnosis not present

## 2024-09-17 DIAGNOSIS — M5137 Other intervertebral disc degeneration, lumbosacral region with discogenic back pain only: Secondary | ICD-10-CM | POA: Diagnosis not present

## 2024-09-17 DIAGNOSIS — M5136 Other intervertebral disc degeneration, lumbar region with discogenic back pain only: Secondary | ICD-10-CM | POA: Diagnosis not present

## 2024-09-20 DIAGNOSIS — M9902 Segmental and somatic dysfunction of thoracic region: Secondary | ICD-10-CM | POA: Diagnosis not present

## 2024-09-20 DIAGNOSIS — M9903 Segmental and somatic dysfunction of lumbar region: Secondary | ICD-10-CM | POA: Diagnosis not present

## 2024-09-20 DIAGNOSIS — M5136 Other intervertebral disc degeneration, lumbar region with discogenic back pain only: Secondary | ICD-10-CM | POA: Diagnosis not present

## 2024-09-20 DIAGNOSIS — M5134 Other intervertebral disc degeneration, thoracic region: Secondary | ICD-10-CM | POA: Diagnosis not present

## 2024-09-20 DIAGNOSIS — M9901 Segmental and somatic dysfunction of cervical region: Secondary | ICD-10-CM | POA: Diagnosis not present

## 2024-09-20 DIAGNOSIS — M5032 Other cervical disc degeneration, mid-cervical region, unspecified level: Secondary | ICD-10-CM | POA: Diagnosis not present

## 2024-09-20 DIAGNOSIS — M9904 Segmental and somatic dysfunction of sacral region: Secondary | ICD-10-CM | POA: Diagnosis not present

## 2024-09-20 DIAGNOSIS — M5137 Other intervertebral disc degeneration, lumbosacral region with discogenic back pain only: Secondary | ICD-10-CM | POA: Diagnosis not present

## 2024-10-07 ENCOUNTER — Ambulatory Visit: Admitting: Urgent Care

## 2024-10-14 DIAGNOSIS — M7521 Bicipital tendinitis, right shoulder: Secondary | ICD-10-CM | POA: Diagnosis not present

## 2024-11-07 DIAGNOSIS — H26492 Other secondary cataract, left eye: Secondary | ICD-10-CM | POA: Diagnosis not present

## 2024-11-07 DIAGNOSIS — H40013 Open angle with borderline findings, low risk, bilateral: Secondary | ICD-10-CM | POA: Diagnosis not present

## 2024-11-11 DIAGNOSIS — M25511 Pain in right shoulder: Secondary | ICD-10-CM | POA: Diagnosis not present

## 2024-11-12 DIAGNOSIS — E7800 Pure hypercholesterolemia, unspecified: Secondary | ICD-10-CM | POA: Diagnosis not present

## 2024-11-12 DIAGNOSIS — G43009 Migraine without aura, not intractable, without status migrainosus: Secondary | ICD-10-CM | POA: Diagnosis not present

## 2024-11-12 DIAGNOSIS — F419 Anxiety disorder, unspecified: Secondary | ICD-10-CM | POA: Diagnosis not present

## 2024-11-12 DIAGNOSIS — F5101 Primary insomnia: Secondary | ICD-10-CM | POA: Diagnosis not present

## 2024-11-12 DIAGNOSIS — Z681 Body mass index (BMI) 19 or less, adult: Secondary | ICD-10-CM | POA: Diagnosis not present

## 2024-11-12 DIAGNOSIS — M542 Cervicalgia: Secondary | ICD-10-CM | POA: Diagnosis not present

## 2024-11-12 DIAGNOSIS — N1831 Chronic kidney disease, stage 3a: Secondary | ICD-10-CM | POA: Diagnosis not present

## 2024-11-14 DIAGNOSIS — L570 Actinic keratosis: Secondary | ICD-10-CM | POA: Diagnosis not present

## 2024-11-14 DIAGNOSIS — D485 Neoplasm of uncertain behavior of skin: Secondary | ICD-10-CM | POA: Diagnosis not present

## 2024-11-14 DIAGNOSIS — C4442 Squamous cell carcinoma of skin of scalp and neck: Secondary | ICD-10-CM | POA: Diagnosis not present

## 2025-01-16 ENCOUNTER — Ambulatory Visit
